# Patient Record
Sex: Male | Born: 1945 | Race: White | Hispanic: No | Smoking: Former smoker
Health system: Southern US, Community
[De-identification: ages and names within clinical notes are randomized; demographics above are authoritative.]

## PROBLEM LIST (undated history)

## (undated) DIAGNOSIS — F1096 Alcohol use, unspecified with alcohol-induced persisting amnestic disorder: Secondary | ICD-10-CM

## (undated) DIAGNOSIS — F4310 Post-traumatic stress disorder, unspecified: Secondary | ICD-10-CM

## (undated) DIAGNOSIS — R42 Dizziness and giddiness: Secondary | ICD-10-CM

## (undated) DIAGNOSIS — E162 Hypoglycemia, unspecified: Secondary | ICD-10-CM

## (undated) DIAGNOSIS — R32 Unspecified urinary incontinence: Secondary | ICD-10-CM

## (undated) DIAGNOSIS — F329 Major depressive disorder, single episode, unspecified: Secondary | ICD-10-CM

## (undated) DIAGNOSIS — M25512 Pain in left shoulder: Secondary | ICD-10-CM

## (undated) DIAGNOSIS — I708 Atherosclerosis of other arteries: Secondary | ICD-10-CM

## (undated) DIAGNOSIS — G9349 Other encephalopathy: Secondary | ICD-10-CM

## (undated) DIAGNOSIS — R262 Difficulty in walking, not elsewhere classified: Secondary | ICD-10-CM

## (undated) DIAGNOSIS — F0390 Unspecified dementia without behavioral disturbance: Secondary | ICD-10-CM

## (undated) DIAGNOSIS — N433 Hydrocele, unspecified: Secondary | ICD-10-CM

## (undated) DIAGNOSIS — E0781 Sick-euthyroid syndrome: Secondary | ICD-10-CM

## (undated) DIAGNOSIS — I7090 Unspecified atherosclerosis: Secondary | ICD-10-CM

## (undated) DIAGNOSIS — H3500 Unspecified background retinopathy: Secondary | ICD-10-CM

## (undated) DIAGNOSIS — F29 Unspecified psychosis not due to a substance or known physiological condition: Secondary | ICD-10-CM

## (undated) DIAGNOSIS — N200 Calculus of kidney: Secondary | ICD-10-CM

## (undated) DIAGNOSIS — E039 Hypothyroidism, unspecified: Secondary | ICD-10-CM

## (undated) DIAGNOSIS — D649 Anemia, unspecified: Secondary | ICD-10-CM

## (undated) DIAGNOSIS — K861 Other chronic pancreatitis: Secondary | ICD-10-CM

## (undated) DIAGNOSIS — I10 Essential (primary) hypertension: Secondary | ICD-10-CM

## (undated) DIAGNOSIS — E119 Type 2 diabetes mellitus without complications: Secondary | ICD-10-CM

## (undated) DIAGNOSIS — K5900 Constipation, unspecified: Secondary | ICD-10-CM

## (undated) DIAGNOSIS — Z9181 History of falling: Secondary | ICD-10-CM

## (undated) DIAGNOSIS — I951 Orthostatic hypotension: Secondary | ICD-10-CM

## (undated) DIAGNOSIS — M5412 Radiculopathy, cervical region: Secondary | ICD-10-CM

## (undated) DIAGNOSIS — E539 Vitamin B deficiency, unspecified: Secondary | ICD-10-CM

## (undated) DIAGNOSIS — G4700 Insomnia, unspecified: Secondary | ICD-10-CM

## (undated) DIAGNOSIS — J449 Chronic obstructive pulmonary disease, unspecified: Secondary | ICD-10-CM

## (undated) DIAGNOSIS — F32A Depression, unspecified: Secondary | ICD-10-CM

## (undated) DIAGNOSIS — E559 Vitamin D deficiency, unspecified: Secondary | ICD-10-CM

## (undated) DIAGNOSIS — R55 Syncope and collapse: Secondary | ICD-10-CM

## (undated) DIAGNOSIS — H548 Legal blindness, as defined in USA: Secondary | ICD-10-CM

## (undated) DIAGNOSIS — F514 Sleep terrors [night terrors]: Secondary | ICD-10-CM

## (undated) DIAGNOSIS — F1021 Alcohol dependence, in remission: Secondary | ICD-10-CM

## (undated) DIAGNOSIS — N400 Enlarged prostate without lower urinary tract symptoms: Secondary | ICD-10-CM

## (undated) DIAGNOSIS — F1011 Alcohol abuse, in remission: Secondary | ICD-10-CM

## (undated) DIAGNOSIS — F419 Anxiety disorder, unspecified: Secondary | ICD-10-CM

## (undated) HISTORY — DX: Sick-euthyroid syndrome: E07.81

## (undated) HISTORY — PX: HAND SURGERY: SHX662

## (undated) HISTORY — DX: Essential (primary) hypertension: I10

## (undated) HISTORY — DX: Calculus of kidney: N20.0

## (undated) HISTORY — PX: BACK SURGERY: SHX140

## (undated) HISTORY — DX: Vitamin B deficiency, unspecified: E53.9

## (undated) HISTORY — PX: MOHS SURGERY: SHX181

## (undated) HISTORY — DX: Vitamin D deficiency, unspecified: E55.9

## (undated) HISTORY — DX: Unspecified background retinopathy: H35.00

## (undated) HISTORY — DX: Type 2 diabetes mellitus without complications: E11.9

## (undated) HISTORY — DX: Post-traumatic stress disorder, unspecified: F43.10

## (undated) HISTORY — DX: Benign prostatic hyperplasia without lower urinary tract symptoms: N40.0

## (undated) HISTORY — PX: HIP SURGERY: SHX245

## (undated) HISTORY — PX: HERNIA REPAIR: SHX51

## (undated) HISTORY — PX: HEMORRHOID SURGERY: SHX153

## (undated) HISTORY — DX: Other chronic pancreatitis: K86.1

---

## 2004-06-20 ENCOUNTER — Ambulatory Visit: Payer: Self-pay | Admitting: Cardiology

## 2004-07-15 HISTORY — PX: CELIAC ARTERY ANEURYSM REPAIR: SUR1156

## 2006-06-24 ENCOUNTER — Ambulatory Visit: Payer: Self-pay | Admitting: Gastroenterology

## 2006-06-27 ENCOUNTER — Ambulatory Visit: Payer: Self-pay | Admitting: Gastroenterology

## 2006-06-27 ENCOUNTER — Ambulatory Visit (HOSPITAL_COMMUNITY): Admission: RE | Admit: 2006-06-27 | Discharge: 2006-06-27 | Payer: Self-pay | Admitting: Gastroenterology

## 2010-12-15 ENCOUNTER — Emergency Department (HOSPITAL_COMMUNITY)
Admission: EM | Admit: 2010-12-15 | Discharge: 2010-12-15 | Disposition: A | Discharge status: Home / Self Care | Payer: Medicare Other | Attending: Emergency Medicine | Admitting: Emergency Medicine

## 2010-12-15 DIAGNOSIS — Z794 Long term (current) use of insulin: Secondary | ICD-10-CM | POA: Insufficient documentation

## 2010-12-15 DIAGNOSIS — E119 Type 2 diabetes mellitus without complications: Secondary | ICD-10-CM | POA: Insufficient documentation

## 2010-12-15 DIAGNOSIS — F0390 Unspecified dementia without behavioral disturbance: Secondary | ICD-10-CM | POA: Insufficient documentation

## 2010-12-15 LAB — COMPREHENSIVE METABOLIC PANEL
ALT: 13 U/L (ref 0–53)
AST: 15 U/L (ref 0–37)
Albumin: 3.5 g/dL (ref 3.5–5.2)
Alkaline Phosphatase: 88 U/L (ref 39–117)
BUN: 21 mg/dL (ref 6–23)
CO2: 28 mEq/L (ref 19–32)
Calcium: 9.4 mg/dL (ref 8.4–10.5)
Chloride: 90 mEq/L — ABNORMAL LOW (ref 96–112)
Creatinine, Ser: 0.67 mg/dL (ref 0.4–1.5)
GFR calc Af Amer: >60 mL/min (ref >60)
GFR calc non Af Amer: >60 mL/min (ref >60)
Glucose, Bld: 298 mg/dL — ABNORMAL HIGH (ref 70–99)
Potassium: 4.2 mEq/L (ref 3.5–5.1)
Sodium: 130 mEq/L — ABNORMAL LOW (ref 135–145)
Total Bilirubin: 0.2 mg/dL — ABNORMAL LOW (ref 0.3–1.2)
Total Protein: 7.5 g/dL (ref 6.0–8.3)

## 2010-12-15 LAB — RAPID URINE DRUG SCREEN, HOSP PERFORMED
Amphetamines: NOT DETECTED
Barbiturates: NOT DETECTED
Benzodiazepines: POSITIVE — AB
Cocaine: NOT DETECTED
Opiates: NOT DETECTED
Tetrahydrocannabinol: NOT DETECTED

## 2010-12-15 LAB — DIFFERENTIAL
Basophils Absolute: 0.1 10*3/uL (ref 0.0–0.1)
Basophils Relative: 1 % (ref 0–1)
Eosinophils Absolute: 0.4 10*3/uL (ref 0.0–0.7)
Eosinophils Relative: 6 % — ABNORMAL HIGH (ref 0–5)
Lymphocytes Relative: 35 % (ref 12–46)
Lymphs Abs: 2.4 10*3/uL (ref 0.7–4.0)
Monocytes Absolute: 0.6 10*3/uL (ref 0.1–1.0)
Monocytes Relative: 8 % (ref 3–12)
Neutro Abs: 3.4 10*3/uL (ref 1.7–7.7)
Neutrophils Relative %: 49 % (ref 43–77)

## 2010-12-15 LAB — GLUCOSE, CAPILLARY: Glucose-Capillary: 280 mg/dL — ABNORMAL HIGH (ref 70–99)

## 2010-12-15 LAB — CBC
HCT: 38.8 % — ABNORMAL LOW (ref 39.0–52.0)
Hemoglobin: 12.7 g/dL — ABNORMAL LOW (ref 13.0–17.0)
MCH: 28.4 pg (ref 26.0–34.0)
MCHC: 32.7 g/dL (ref 30.0–36.0)
MCV: 86.8 fL (ref 78.0–100.0)
Platelets: 251 10*3/uL (ref 150–400)
RBC: 4.47 MIL/uL (ref 4.22–5.81)
RDW: 14.6 % (ref 11.5–15.5)
WBC: 6.9 10*3/uL (ref 4.0–10.5)

## 2010-12-15 LAB — URINALYSIS, ROUTINE W REFLEX MICROSCOPIC
Bilirubin Urine: NEGATIVE
Glucose, UA: >1000 mg/dL — AB
Hgb urine dipstick: NEGATIVE
Ketones, ur: NEGATIVE mg/dL
Leukocytes, UA: NEGATIVE
Nitrite: NEGATIVE
Protein, ur: NEGATIVE mg/dL
Specific Gravity, Urine: 1.026 (ref 1.005–1.030)
Urobilinogen, UA: 0.2 mg/dL (ref 0.0–1.0)
pH: 5 (ref 5.0–8.0)

## 2010-12-15 LAB — URINE MICROSCOPIC-ADD ON

## 2011-04-19 ENCOUNTER — Encounter: Payer: Self-pay | Admitting: Gastroenterology

## 2011-09-24 ENCOUNTER — Emergency Department (HOSPITAL_COMMUNITY)
Admission: EM | Admit: 2011-09-24 | Discharge: 2011-09-25 | Disposition: A | Discharge status: Other Acute Inpatient Hospital | Payer: Medicare Other | Attending: Emergency Medicine | Admitting: Emergency Medicine

## 2011-09-24 ENCOUNTER — Encounter (HOSPITAL_COMMUNITY): Payer: Self-pay

## 2011-09-24 ENCOUNTER — Emergency Department (HOSPITAL_COMMUNITY): Payer: Medicare Other

## 2011-09-24 ENCOUNTER — Other Ambulatory Visit: Payer: Self-pay

## 2011-09-24 DIAGNOSIS — F341 Dysthymic disorder: Secondary | ICD-10-CM | POA: Insufficient documentation

## 2011-09-24 DIAGNOSIS — E119 Type 2 diabetes mellitus without complications: Secondary | ICD-10-CM | POA: Insufficient documentation

## 2011-09-24 DIAGNOSIS — H409 Unspecified glaucoma: Secondary | ICD-10-CM | POA: Insufficient documentation

## 2011-09-24 DIAGNOSIS — F068 Other specified mental disorders due to known physiological condition: Secondary | ICD-10-CM | POA: Insufficient documentation

## 2011-09-24 DIAGNOSIS — R45851 Suicidal ideations: Secondary | ICD-10-CM

## 2011-09-24 DIAGNOSIS — R110 Nausea: Secondary | ICD-10-CM | POA: Insufficient documentation

## 2011-09-24 DIAGNOSIS — R197 Diarrhea, unspecified: Secondary | ICD-10-CM | POA: Insufficient documentation

## 2011-09-24 HISTORY — DX: Anemia, unspecified: D64.9

## 2011-09-24 HISTORY — DX: Unspecified dementia, unspecified severity, without behavioral disturbance, psychotic disturbance, mood disturbance, and anxiety: F03.90

## 2011-09-24 HISTORY — DX: Depression, unspecified: F32.A

## 2011-09-24 HISTORY — DX: Major depressive disorder, single episode, unspecified: F32.9

## 2011-09-24 HISTORY — DX: Chronic obstructive pulmonary disease, unspecified: J44.9

## 2011-09-24 HISTORY — DX: Anxiety disorder, unspecified: F41.9

## 2011-09-24 HISTORY — DX: Hypoglycemia, unspecified: E16.2

## 2011-09-24 LAB — COMPREHENSIVE METABOLIC PANEL
ALT: 9 U/L (ref 0–53)
AST: 14 U/L (ref 0–37)
Albumin: 3.5 g/dL (ref 3.5–5.2)
Alkaline Phosphatase: 95 U/L (ref 39–117)
BUN: 15 mg/dL (ref 6–23)
CO2: 29 mEq/L (ref 19–32)
Calcium: 9.7 mg/dL (ref 8.4–10.5)
Chloride: 97 mEq/L (ref 96–112)
Creatinine, Ser: 1.11 mg/dL (ref 0.50–1.35)
GFR calc Af Amer: 79 mL/min — ABNORMAL LOW (ref >90)
GFR calc non Af Amer: 68 mL/min — ABNORMAL LOW (ref >90)
Glucose, Bld: 181 mg/dL — ABNORMAL HIGH (ref 70–99)
Potassium: 4.6 mEq/L (ref 3.5–5.1)
Sodium: 135 mEq/L (ref 135–145)
Total Bilirubin: 0.2 mg/dL — ABNORMAL LOW (ref 0.3–1.2)
Total Protein: 7.5 g/dL (ref 6.0–8.3)

## 2011-09-24 LAB — DIFFERENTIAL
Basophils Absolute: 0.1 10*3/uL (ref 0.0–0.1)
Basophils Relative: 1 % (ref 0–1)
Eosinophils Absolute: 0.5 10*3/uL (ref 0.0–0.7)
Eosinophils Relative: 7 % — ABNORMAL HIGH (ref 0–5)
Lymphocytes Relative: 38 % (ref 12–46)
Lymphs Abs: 2.8 10*3/uL (ref 0.7–4.0)
Monocytes Absolute: 0.8 10*3/uL (ref 0.1–1.0)
Monocytes Relative: 11 % (ref 3–12)
Neutro Abs: 3.3 10*3/uL (ref 1.7–7.7)
Neutrophils Relative %: 44 % (ref 43–77)

## 2011-09-24 LAB — ETHANOL: Alcohol, Ethyl (B): <11 mg/dL (ref 0–11)

## 2011-09-24 LAB — RAPID URINE DRUG SCREEN, HOSP PERFORMED
Amphetamines: NOT DETECTED
Barbiturates: NOT DETECTED
Benzodiazepines: POSITIVE — AB
Cocaine: NOT DETECTED
Opiates: NOT DETECTED
Tetrahydrocannabinol: NOT DETECTED

## 2011-09-24 LAB — VALPROIC ACID LEVEL: Valproic Acid Lvl: 17.7 ug/mL — ABNORMAL LOW (ref 50.0–100.0)

## 2011-09-24 LAB — URINALYSIS, ROUTINE W REFLEX MICROSCOPIC
Bilirubin Urine: NEGATIVE
Glucose, UA: NEGATIVE mg/dL
Hgb urine dipstick: NEGATIVE
Ketones, ur: NEGATIVE mg/dL
Leukocytes, UA: NEGATIVE
Nitrite: NEGATIVE
Protein, ur: NEGATIVE mg/dL
Specific Gravity, Urine: 1.02 (ref 1.005–1.030)
Urobilinogen, UA: 0.2 mg/dL (ref 0.0–1.0)
pH: 6 (ref 5.0–8.0)

## 2011-09-24 LAB — CBC
HCT: 40.2 % (ref 39.0–52.0)
Hemoglobin: 13.2 g/dL (ref 13.0–17.0)
MCH: 29 pg (ref 26.0–34.0)
MCHC: 32.8 g/dL (ref 30.0–36.0)
MCV: 88.4 fL (ref 78.0–100.0)
Platelets: 244 10*3/uL (ref 150–400)
RBC: 4.55 MIL/uL (ref 4.22–5.81)
RDW: 14.7 % (ref 11.5–15.5)
WBC: 7.4 10*3/uL (ref 4.0–10.5)

## 2011-09-24 MED ORDER — LORAZEPAM 1 MG PO TABS: 1.0000 mg | ORAL_TABLET | Freq: Three times a day (TID) | ORAL | Status: DC | PRN

## 2011-09-24 MED ORDER — ACETAMINOPHEN 325 MG PO TABS: 650.0000 mg | ORAL_TABLET | ORAL | Status: DC | PRN

## 2011-09-24 MED ORDER — ONDANSETRON HCL 4 MG PO TABS: 4.0000 mg | ORAL_TABLET | Freq: Three times a day (TID) | ORAL | Status: DC | PRN

## 2011-09-24 MED ORDER — IBUPROFEN 400 MG PO TABS: 600.0000 mg | ORAL_TABLET | Freq: Three times a day (TID) | ORAL | Status: DC | PRN

## 2011-09-24 NOTE — ED Notes (Signed)
Per Orvilla Fus from ACT team patient accepted at [facility] if can be medically cleared from here. EDP aware. Tests results to be faxed  To [facility] when complete.

## 2011-09-24 NOTE — ED Notes (Signed)
Pt here from [facility] with an order that reads send to [facility] for CT due to suicidal threats and behaviors. Nurse called nursing home to see where pt was suppose to be. Talked to several people at nursing home but no one was sure what to do with pt. One nurse states pt was clear to go to [facility] and had been accepted there. Nursing home is under the impression that pt can come here det lab work and a CT and go directly there from here. In talking with pt ( he has dementia ) states he has had suicidal thoughts for a year but has no plan

## 2011-09-24 NOTE — ED Notes (Signed)
Test/lab results faxed to [facility] RN.

## 2011-09-24 NOTE — ED Provider Notes (Signed)
History  Scribed for Joya Gaskins, MD, the patient was seen in room APA03/APA03. This chart was scribed by Candelaria Stagers. The patient's care started at 3:14 PM    CSN: [id number]  Arrival date & time 09/24/11  1454   First MD Initiated Contact with Patient 09/24/11 1504     Chief Complaint - suicidal   The history is provided by the patient. The history is limited by the condition of the patient.   Joseph Bowen is a 66 y.o. male who presents to the Emergency Department from [facility] with an order that reads send to [facility] for CT due to suicidal threats and behavior.  Suicidal thoughts started about one year ago.  He states that he has no plans and has no thought of hurting anyone else.  He is experiencing nausea and diarrhea.  He denies rash, headache, and abdmoinal pain.  No other details are known   Past Medical History  Diagnosis Date  . Anxiety   . Depression   . Diabetes mellitus   . Glaucoma   . Dementia   . Altered mental status     History reviewed. No pertinent past surgical history.  No family history on file.  History  Substance Use Topics  . Smoking status: Not on file  . Smokeless tobacco: Not on file  . Alcohol Use:       Review of Systems  Unable to perform ROS: Dementia  Gastrointestinal: Positive for nausea and diarrhea. Negative for abdominal pain.  Neurological: Negative for headaches.  Psychiatric/Behavioral: Positive for suicidal ideas.   Allergies  Review of patient's allergies indicates no known allergies.  Home Medications  No current outpatient prescriptions on file.  BP 134/71  Pulse 82  Temp(Src) 98 F (36.7 C) (Oral)  Resp 18  SpO2 99%  Physical Exam CONSTITUTIONAL: Well developed/well nourished HEAD AND FACE: Normocephalic/atraumatic EYES: EOMI/PERRL ENMT: Mucous membranes moist NECK: supple no meningeal signs CV: S1/S2 noted, no murmurs/rubs/gallops noted LUNGS: Lungs are clear to auscultation bilaterally,  no apparent distress ABDOMEN: soft, nontender, no rebound or guarding GU:no cva tenderness NEURO: Pt is awake/alert, moves all extremitiesx4, gait normal EXTREMITIES: pulses normal, full ROM SKIN: warm, color normal PSYCH: flat affect  ED Course  Procedures  DIAGNOSTIC STUDIES: Oxygen Saturation is 99% on room air, normal by my interpretation.    COORDINATION OF CARE:  3:21PM Ordered: CBC ; Differential ; Comprehensive metabolic panel ; Urinalysis, Routine w reflex microscopic ; Ethanol ; Urine rapid drug screen (hosp performed) ; Valproic acid level ; CT Head Wo Contrast ; DG Chest 2 View ; ED EKG ; Suicide precautions ; Re-check Vital Signs   4:08 PM Pt awaiting medical evaluation then likely transfer to [facility]  Accepted to [place] No acute issues while in the ED Medical stable at this time  MDM  Nursing notes reviewed and considered in documentation All labs/vitals reviewed and considered    Date: 09/24/2011  Rate: 82  Rhythm: normal sinus rhythm  QRS Axis: normal  Intervals: normal  ST/T Wave abnormalities: normal  Conduction Disutrbances:none  Narrative Interpretation:   Old EKG Reviewed: none available at time of interpretation    I personally performed the services described in this documentation, which was scribed in my presence. The recorded information has been reviewed and considered.           Joya Gaskins, MD 09/24/11 Harrietta Guardian

## 2011-09-25 NOTE — ED Notes (Signed)
[facility] here to get pt.  Called report to [place].

## 2012-09-02 ENCOUNTER — Ambulatory Visit (HOSPITAL_COMMUNITY)
Admission: RE | Admit: 2012-09-02 | Discharge: 2012-09-02 | Disposition: A | Discharge status: Home / Self Care | Payer: PRIVATE HEALTH INSURANCE | Source: Ambulatory Visit | Attending: Orthopedic Surgery | Admitting: Orthopedic Surgery

## 2012-09-02 ENCOUNTER — Encounter: Payer: Self-pay | Admitting: Orthopedic Surgery

## 2012-09-02 ENCOUNTER — Other Ambulatory Visit: Payer: Self-pay | Admitting: Orthopedic Surgery

## 2012-09-02 ENCOUNTER — Ambulatory Visit (INDEPENDENT_AMBULATORY_CARE_PROVIDER_SITE_OTHER): Payer: Medicare Other | Admitting: Orthopedic Surgery

## 2012-09-02 VITALS — BP 102/62 | Ht 68.0 in | Wt 148.8 lb

## 2012-09-02 DIAGNOSIS — M79643 Pain in unspecified hand: Secondary | ICD-10-CM

## 2012-09-02 DIAGNOSIS — M720 Palmar fascial fibromatosis [Dupuytren]: Secondary | ICD-10-CM

## 2012-09-02 DIAGNOSIS — M20099 Other deformity of finger(s), unspecified finger(s): Secondary | ICD-10-CM | POA: Insufficient documentation

## 2012-09-02 DIAGNOSIS — M79609 Pain in unspecified limb: Secondary | ICD-10-CM | POA: Insufficient documentation

## 2012-09-02 NOTE — Addendum Note (Signed)
Addended by: Vickki Hearing on: 09/02/2012 12:34 PM   Modules accepted: Level of Service

## 2012-09-02 NOTE — Progress Notes (Signed)
Patient ID: Joseph Bowen, male   DOB: October 30, 1945, 67 y.o.   MRN: [id number] Chief Complaint  Patient presents with  . Hand Pain    Right ring finger pain, no injury   Consultation has been requested by Dr.Quereshi  This patient resides at [facility] he does not complain of any pain he was sent here for evaluation of cross over deformity of his ring and small finger on his right hand as well as palmar contracture consistent with Dupuytren's. An x-ray was obtained that I reviewed and it shows he had an old worth metacarpal fracture it was spiral but it healed normally.  Related review of systems he denies any numbness or tingling in the hand  Expanded physical exam BP 102/62  Ht 5\' 8"  (1.727 m)  Wt 148 lb 12.8 oz (67.495 kg)  BMI 22.63 kg/m2 General appearance normal patient is oriented to person mood is pleasant. He does have cross over deformity of the ring and small fingers but when he flexes and makes his fist everything straightened back out he has a palmar cord in the ring finger has no tenderness. He appears to have hyperextension at the PIP joint skin is otherwise intact color and temperature of the digit normal  X-ray reviewed as stated report reviewed as stated no additional findings on the report again old fourth metacarpal fracture  Plan conservative care.  Dupuytren's disease of palm

## 2012-09-21 ENCOUNTER — Emergency Department (HOSPITAL_COMMUNITY): Payer: PRIVATE HEALTH INSURANCE

## 2012-09-21 ENCOUNTER — Emergency Department (HOSPITAL_COMMUNITY)
Admission: EM | Admit: 2012-09-21 | Discharge: 2012-09-21 | Disposition: A | Discharge status: Other Acute Inpatient Hospital | Payer: PRIVATE HEALTH INSURANCE | Attending: Emergency Medicine | Admitting: Emergency Medicine

## 2012-09-21 ENCOUNTER — Encounter (HOSPITAL_COMMUNITY): Payer: Self-pay

## 2012-09-21 DIAGNOSIS — W19XXXA Unspecified fall, initial encounter: Secondary | ICD-10-CM

## 2012-09-21 DIAGNOSIS — J449 Chronic obstructive pulmonary disease, unspecified: Secondary | ICD-10-CM | POA: Insufficient documentation

## 2012-09-21 DIAGNOSIS — Z794 Long term (current) use of insulin: Secondary | ICD-10-CM | POA: Insufficient documentation

## 2012-09-21 DIAGNOSIS — Z7982 Long term (current) use of aspirin: Secondary | ICD-10-CM | POA: Insufficient documentation

## 2012-09-21 DIAGNOSIS — E1169 Type 2 diabetes mellitus with other specified complication: Secondary | ICD-10-CM | POA: Insufficient documentation

## 2012-09-21 DIAGNOSIS — W06XXXA Fall from bed, initial encounter: Secondary | ICD-10-CM | POA: Insufficient documentation

## 2012-09-21 DIAGNOSIS — Z8669 Personal history of other diseases of the nervous system and sense organs: Secondary | ICD-10-CM | POA: Insufficient documentation

## 2012-09-21 DIAGNOSIS — Y9210 Unspecified residential institution as the place of occurrence of the external cause: Secondary | ICD-10-CM | POA: Insufficient documentation

## 2012-09-21 DIAGNOSIS — S63259A Unspecified dislocation of unspecified finger, initial encounter: Secondary | ICD-10-CM | POA: Insufficient documentation

## 2012-09-21 DIAGNOSIS — J4489 Other specified chronic obstructive pulmonary disease: Secondary | ICD-10-CM | POA: Insufficient documentation

## 2012-09-21 DIAGNOSIS — F0390 Unspecified dementia without behavioral disturbance: Secondary | ICD-10-CM | POA: Insufficient documentation

## 2012-09-21 DIAGNOSIS — F3289 Other specified depressive episodes: Secondary | ICD-10-CM | POA: Insufficient documentation

## 2012-09-21 DIAGNOSIS — D649 Anemia, unspecified: Secondary | ICD-10-CM | POA: Insufficient documentation

## 2012-09-21 DIAGNOSIS — T07XXXA Unspecified multiple injuries, initial encounter: Secondary | ICD-10-CM | POA: Insufficient documentation

## 2012-09-21 DIAGNOSIS — F329 Major depressive disorder, single episode, unspecified: Secondary | ICD-10-CM | POA: Insufficient documentation

## 2012-09-21 DIAGNOSIS — Y939 Activity, unspecified: Secondary | ICD-10-CM | POA: Insufficient documentation

## 2012-09-21 DIAGNOSIS — IMO0002 Reserved for concepts with insufficient information to code with codable children: Secondary | ICD-10-CM | POA: Insufficient documentation

## 2012-09-21 DIAGNOSIS — S329XXA Fracture of unspecified parts of lumbosacral spine and pelvis, initial encounter for closed fracture: Secondary | ICD-10-CM | POA: Insufficient documentation

## 2012-09-21 DIAGNOSIS — Z79899 Other long term (current) drug therapy: Secondary | ICD-10-CM | POA: Insufficient documentation

## 2012-09-21 DIAGNOSIS — F411 Generalized anxiety disorder: Secondary | ICD-10-CM | POA: Insufficient documentation

## 2012-09-21 DIAGNOSIS — S6980XA Other specified injuries of unspecified wrist, hand and finger(s), initial encounter: Secondary | ICD-10-CM | POA: Insufficient documentation

## 2012-09-21 DIAGNOSIS — S6990XA Unspecified injury of unspecified wrist, hand and finger(s), initial encounter: Secondary | ICD-10-CM | POA: Insufficient documentation

## 2012-09-21 LAB — URINALYSIS, ROUTINE W REFLEX MICROSCOPIC
Bilirubin Urine: NEGATIVE
Glucose, UA: NEGATIVE mg/dL
Hgb urine dipstick: NEGATIVE
Leukocytes, UA: NEGATIVE
Nitrite: NEGATIVE
Protein, ur: NEGATIVE mg/dL
Specific Gravity, Urine: >1.03 — ABNORMAL HIGH (ref 1.005–1.030)
Urobilinogen, UA: 0.2 mg/dL (ref 0.0–1.0)
pH: 5.5 (ref 5.0–8.0)

## 2012-09-21 LAB — BASIC METABOLIC PANEL
BUN: 15 mg/dL (ref 6–23)
CO2: 24 mEq/L (ref 19–32)
Calcium: 9.5 mg/dL (ref 8.4–10.5)
Chloride: 96 mEq/L (ref 96–112)
Creatinine, Ser: 0.76 mg/dL (ref 0.50–1.35)
GFR calc Af Amer: >90 mL/min (ref >90)
GFR calc non Af Amer: >90 mL/min (ref >90)
Glucose, Bld: 155 mg/dL — ABNORMAL HIGH (ref 70–99)
Potassium: 4.2 mEq/L (ref 3.5–5.1)
Sodium: 134 mEq/L — ABNORMAL LOW (ref 135–145)

## 2012-09-21 LAB — CBC
HCT: 38.6 % — ABNORMAL LOW (ref 39.0–52.0)
Hemoglobin: 12.7 g/dL — ABNORMAL LOW (ref 13.0–17.0)
MCH: 27.9 pg (ref 26.0–34.0)
MCHC: 32.9 g/dL (ref 30.0–36.0)
MCV: 84.8 fL (ref 78.0–100.0)
Platelets: 288 10*3/uL (ref 150–400)
RBC: 4.55 MIL/uL (ref 4.22–5.81)
RDW: 15.4 % (ref 11.5–15.5)
WBC: 10.4 10*3/uL (ref 4.0–10.5)

## 2012-09-21 LAB — TROPONIN I: Troponin I: <0.3 ng/mL (ref <0.30)

## 2012-09-21 MED ORDER — CEPHALEXIN 500 MG PO CAPS: 500.0000 mg | ORAL_CAPSULE | Freq: Four times a day (QID) | ORAL | Status: DC

## 2012-09-21 MED ORDER — HYDROCODONE-ACETAMINOPHEN 5-325 MG PO TABS: 1.0000 | ORAL_TABLET | Freq: Four times a day (QID) | ORAL | Status: DC | PRN

## 2012-09-21 MED ORDER — HYDROMORPHONE HCL PF 1 MG/ML IJ SOLN
1.0000 mg | Freq: Once | INTRAMUSCULAR | Status: AC
  Administered 2012-09-21: 1 mg via INTRAVENOUS
  Filled 2012-09-21: qty 1

## 2012-09-21 MED ORDER — ONDANSETRON HCL 4 MG/2ML IJ SOLN
4.0000 mg | Freq: Once | INTRAMUSCULAR | Status: AC
  Administered 2012-09-21: 4 mg via INTRAVENOUS
  Filled 2012-09-21: qty 2

## 2012-09-21 MED ORDER — FENTANYL CITRATE 0.05 MG/ML IJ SOLN
50.0000 ug | Freq: Once | INTRAMUSCULAR | Status: AC
  Administered 2012-09-21: 50 ug via INTRAVENOUS
  Filled 2012-09-21: qty 2

## 2012-09-21 MED ORDER — CEFAZOLIN SODIUM 1-5 GM-% IV SOLN
1.0000 g | Freq: Once | INTRAVENOUS | Status: AC
  Administered 2012-09-21: 1 g via INTRAVENOUS
  Filled 2012-09-21: qty 50

## 2012-09-21 MED ORDER — OXYCODONE-ACETAMINOPHEN 5-325 MG PO TABS
1.0000 | ORAL_TABLET | Freq: Once | ORAL | Status: AC
  Administered 2012-09-21: 1 via ORAL
  Filled 2012-09-21: qty 1

## 2012-09-21 MED ORDER — BACITRACIN ZINC 500 UNIT/GM EX OINT
TOPICAL_OINTMENT | CUTANEOUS | Status: AC
  Administered 2012-09-21: 1
  Filled 2012-09-21: qty 0.9

## 2012-09-21 NOTE — ED Notes (Signed)
Report called to Thad Ranger, LPN at [facility].  Reviewed d/c instructions and mobility limitations.  [facility] called to transport back to NH as pt. Is stretcher bound. Patient stable at present, medicated for pain.  O2 sats 90%.

## 2012-09-21 NOTE — ED Provider Notes (Signed)
History     CSN: [id number]  Arrival date & time 09/21/12  7829   First MD Initiated Contact with Patient 09/21/12 [phone]      Chief Complaint  Patient presents with  . Fall  . Hip Injury  . Finger Injury    (Consider location/radiation/quality/duration/timing/severity/associated sxs/prior treatment) HPI History provided by nursing home, EMS and limited history from patient. Level V caveat applies for dementia. Patient found down at nursing home with suspected ground level fall. He has deformity to his finger but also complaining of right hip pain. He denies any neck pain, chest pain or difficulty breathing.  Past Medical History  Diagnosis Date  . Anxiety   . Depression   . Diabetes mellitus   . Glaucoma(365)   . Dementia   . Altered mental status   . COPD (chronic obstructive pulmonary disease)   . Anemia   . Hypoglycemia     Past Surgical History  Procedure Laterality Date  . Hand surgery    . Back surgery      melanoma removed    Family History  Problem Relation Age of Onset  . Heart disease    . Diabetes      History  Substance Use Topics  . Smoking status: Never Smoker   . Smokeless tobacco: Not on file  . Alcohol Use: No      Review of Systems  Unable to perform ROS  level V caveat applies  Allergies  Review of patient's allergies indicates no known allergies.  Home Medications   Current Outpatient Rx  Name  Route  Sig  Dispense  Refill  . aspirin EC 81 MG tablet   Oral   Take 81 mg by mouth daily.         . brimonidine (ALPHAGAN) 0.2 % ophthalmic solution   Both Eyes   Place 1 drop into both eyes 2 (two) times daily.         Marland Kitchen DIAZEPAM IM   Intramuscular   Inject 1 mg into the muscle every 8 (eight) hours as needed. For agitation         . ferrous gluconate (FERGON) 325 MG tablet   Oral   Take 325 mg by mouth 2 (two) times daily with a meal.         . gabapentin (NEURONTIN) 300 MG capsule   Oral   Take 300 mg by mouth 3  (three) times daily.         Marland Kitchen HYDROcodone-acetaminophen (NORCO/VICODIN) 5-325 MG per tablet   Oral   Take 1 tablet by mouth every 6 (six) hours as needed for pain.         Marland Kitchen insulin glargine (LANTUS) 100 UNIT/ML injection   Subcutaneous   Inject 3-20 Units into the skin daily. Inject 3 units at bedtime and 20 units every morning         . LORazepam (ATIVAN) 0.5 MG tablet   Oral   Take 0.5 mg by mouth every 8 (eight) hours.         . Multiple Vitamin (MULITIVITAMIN WITH MINERALS) TABS   Oral   Take 1 tablet by mouth daily.         . pantoprazole (PROTONIX) 40 MG tablet   Oral   Take 40 mg by mouth daily.         . polyethylene glycol (MIRALAX / GLYCOLAX) packet   Oral   Take 17 g by mouth daily.         Marland Kitchen  Tamsulosin HCl (FLOMAX) 0.4 MG CAPS   Oral   Take 0.4 mg by mouth daily.         Marland Kitchen thiamine 100 MG tablet   Oral   Take 100 mg by mouth daily.         Marland Kitchen venlafaxine XR (EFFEXOR-XR) 150 MG 24 hr capsule   Oral   Take 150 mg by mouth daily.         . vitamin B-12 (CYANOCOBALAMIN) 1000 MCG tablet   Oral   Take 1,000 mcg by mouth every morning.         . divalproex (DEPAKOTE) 125 MG DR tablet   Oral   Take 125 mg by mouth 2 (two) times daily.         Marland Kitchen donepezil (ARICEPT) 5 MG tablet   Oral   Take 5 mg by mouth at bedtime.         . insulin aspart (NOVOLOG) 100 UNIT/ML injection   Subcutaneous   Inject 3-8 Units into the skin See admin instructions. **Inject SQ four times daily-151-200= 1 unit, 201-250= 3 units, 301-350= 5 units, 351-400= 6 units, 401-450= 7 units, 451-500= 8 units, 501-550= 9 units, 551-600= 10 units. Inject 3 units daily at noon and 8 units twice daily before meals**         . latanoprost (XALATAN) 0.005 % ophthalmic solution   Both Eyes   Place 1 drop into both eyes at bedtime.         . lipase/protease/amylase (CREON-10/PANCREASE) 12000 UNITS CPEP   Oral   Take 3 capsules by mouth 3 (three) times daily with  meals. **Take 30 minutes before meals**         . lisinopril (PRINIVIL,ZESTRIL) 5 MG tablet   Oral   Take 5 mg by mouth every morning.         . loxapine (LOXITANE) 5 MG capsule   Oral   Take 5 mg by mouth every morning.         . metFORMIN (GLUCOPHAGE) 500 MG tablet   Oral   Take 500 mg by mouth 2 (two) times daily.         Marland Kitchen omeprazole (PRILOSEC) 20 MG capsule   Oral   Take 20 mg by mouth daily.         . polysaccharide iron (NIFEREX) 150 MG CAPS capsule   Oral   Take 150 mg by mouth 2 (two) times daily.         . risperiDONE (RISPERDAL) 0.25 MG tablet   Oral   Take 0.25 mg by mouth 2 (two) times daily.         . sitaGLIPtin (JANUVIA) 50 MG tablet   Oral   Take 50 mg by mouth daily.           BP 137/83  Pulse 78  Temp(Src) 97.5 F (36.4 C) (Oral)  Resp 20  Ht 5\' 8"  (1.727 m)  Wt 150 lb (68.04 kg)  BMI 22.81 kg/m2  SpO2 98%  Physical Exam  Constitutional: He appears well-developed and well-nourished.  HENT:  Head: Normocephalic.  Abrasion to right for head without laceration - there is mild swelling but no significant tenderness. No entrapment with extraocular movements intact. No epistaxis. No trismus.  Eyes: EOM are normal. Pupils are equal, round, and reactive to light.  Neck: Neck supple.  No cervical spine tenderness or deformity. C-collar place  Cardiovascular: Normal rate, regular rhythm and intact distal pulses.   Pulmonary/Chest: Effort  normal and breath sounds normal. No respiratory distress. He exhibits no tenderness.  Abdominal: Soft. Bowel sounds are normal. He exhibits no distension. There is no tenderness.  Musculoskeletal:  Right third PIP deformity with palmar aspect skin defect - appears superficial and is small. Distal cap refill intact. No hand tenderness or deformity otherwise. No wrist tenderness. Pelvis is stable with tenderness over the area of the right greater trochanter. No lower extremity deformity or rotation and  distal neurovascular intact x4  Neurological:  Awake, alert and interactive - no focal deficit  Skin: Skin is warm and dry.    ED Course  Reduction of dislocation Date/Time: 09/21/2012 5:49 AM Performed by: [name] Authorized by: [name] Consent: Verbal consent obtained. Risks and benefits: risks, benefits and alternatives were discussed Consent given by: patient Patient understanding: patient states understanding of the procedure being performed Patient consent: the patient's understanding of the procedure matches consent given Procedure consent: procedure consent matches procedure scheduled Required items: required blood products, implants, devices, and special equipment available Patient identity confirmed: verbally with patient Time out: Immediately prior to procedure a "time out" was called to verify the correct patient, procedure, equipment, support staff and site/side marked as required. Preparation: Patient was prepped and draped in the usual sterile fashion. Patient tolerance: Patient tolerated the procedure well with no immediate complications. Comments: R 3rd digit reduced with traction, finger splint applied   (including critical care time)  Labs Reviewed  CBC - Abnormal; Notable for the following:    Hemoglobin 12.7 (*)    HCT 38.6 (*)    All other components within normal limits  BASIC METABOLIC PANEL - Abnormal; Notable for the following:    Sodium 134 (*)    Glucose, Bld 155 (*)    All other components within normal limits  URINALYSIS, ROUTINE W REFLEX MICROSCOPIC   Results for orders placed during the hospital encounter of 09/21/12  CBC      Result Value Range   WBC 10.4  4.0 - 10.5 K/uL   RBC 4.55  4.22 - 5.81 MIL/uL   Hemoglobin 12.7 (*) 13.0 - 17.0 g/dL   HCT 16.1 (*) 09.6 - 04.5 %   MCV 84.8  78.0 - 100.0 fL   MCH 27.9  26.0 - 34.0 pg   MCHC 32.9  30.0 - 36.0 g/dL   RDW 40.9  81.1 - 91.4 %   Platelets 288  150 - 400 K/uL  BASIC METABOLIC  PANEL      Result Value Range   Sodium 134 (*) 135 - 145 mEq/L   Potassium 4.2  3.5 - 5.1 mEq/L   Chloride 96  96 - 112 mEq/L   CO2 24  19 - 32 mEq/L   Glucose, Bld 155 (*) 70 - 99 mg/dL   BUN 15  6 - 23 mg/dL   Creatinine, Ser 7.82  0.50 - 1.35 mg/dL   Calcium 9.5  8.4 - 95.6 mg/dL   GFR calc non Af Amer >90  >90 mL/min   GFR calc Af Amer >90  >90 mL/min  URINALYSIS, ROUTINE W REFLEX MICROSCOPIC      Result Value Range   Color, Urine YELLOW  YELLOW   APPearance CLEAR  CLEAR   Specific Gravity, Urine >1.030 (*) 1.005 - 1.030   pH 5.5  5.0 - 8.0   Glucose, UA NEGATIVE  NEGATIVE mg/dL   Hgb urine dipstick NEGATIVE  NEGATIVE   Bilirubin Urine NEGATIVE  NEGATIVE   Ketones, ur  TRACE (*) NEGATIVE mg/dL   Protein, ur NEGATIVE  NEGATIVE mg/dL   Urobilinogen, UA 0.2  0.0 - 1.0 mg/dL   Nitrite NEGATIVE  NEGATIVE   Leukocytes, UA NEGATIVE  NEGATIVE  TROPONIN I      Result Value Range   Troponin I <0.30  <0.30 ng/mL   Dg Chest 1 View  09/21/2012  *RADIOLOGY REPORT*  Clinical Data: Fall, back pain  CHEST - 1 VIEW  Comparison: 09/24/2011  Findings: 1 cm rounded density projecting over each lower lung. Linear opacity within the left lower lung / lingula.  Biapical scarring.  No pleural effusion or pneumothorax.  Cardiomediastinal contours within normal range.  No displaced fracture identified.  IMPRESSION: 1 cm nodular density projecting over each lung may be external; such as nipple shadows or radiolucent EKG leads.  Consider PA and lateral radiograph follow-up with nipple markers.   Original Report Authenticated By: Jearld Lesch, M.D.    Dg Thoracic Spine 2 View  09/21/2012  *RADIOLOGY REPORT*  Clinical Data: Fall, back pain  THORACIC SPINE - 2 VIEW  Comparison: 09/24/2011 radiograph  Findings: Diffuse osteopenia.  Multilevel degenerative change.  No displaced fracture or malalignment.  Overlying soft tissues unremarkable.  Metallic density/coils left upper quadrant and calcifications along  the course of the pancreas.  IMPRESSION: Limited characterization given diffuse osteopenia.  No displaced fracture. However, correlate for point tenderness and with cross- sectional imaging if concern persists.   Original Report Authenticated By: Jearld Lesch, M.D.    Dg Lumbar Spine Complete  09/21/2012  *RADIOLOGY REPORT*  Clinical Data: Back pain status post fall  LUMBAR SPINE - COMPLETE 4+ VIEW  Comparison: 08/08/2010 chest CT  Findings: Radiographs of the lumbar spine show mild rightward curvature.  Diffuse osteopenia and multilevel degenerative change. There is a compression fracture at L1, which appears similar to prior.  Otherwise, lumbar vertebrae show maintained height and alignment.  Anterior osteophyte formation at multiple levels and L5 S1 degenerative disc disease.  Advanced atherosclerosis. Pancreatic calcifications.  Left upper quadrant coils.  Overlying soft tissues otherwise unremarkable.  IMPRESSION: L1 compression fracture is unchanged.  No definite acute osseous finding.   Original Report Authenticated By: Jearld Lesch, M.D.    Dg Elbow Complete Right  09/21/2012  *RADIOLOGY REPORT*  Clinical Data: Right elbow pain status post fall  RIGHT ELBOW - COMPLETE 3+ VIEW  Comparison: None.  Findings: No displaced fracture identified.  No dislocation. Lateral view is suboptimal.  No definite joint effusion.  Oval calcific density projecting within the soft tissues lateral and volar to the proximal forearm.  IMPRESSION: No displaced fracture identified.  Suboptimal lateral view.  If clinical concern persists, recommend a short-term repeat lateral view at 90 degrees when the patient can tolerate.   Original Report Authenticated By: Jearld Lesch, M.D.    Dg Hip Complete Right  09/21/2012  *RADIOLOGY REPORT*  Clinical Data: Fall, right hip pain  RIGHT HIP - COMPLETE 2+ VIEW  Comparison: None.  Findings: There are fractures of the right inferior and superior pubic rami.  The femoral  acetabular joint appears intact. Overlying soft tissues unremarkable.  IMPRESSION: Fractures of the right superior and inferior pubic rami.  No displaced right femur fracture.  If clinical concern for a nondisplaced fracture persists, MRI has increased sensitivity.   Original Report Authenticated By: Jearld Lesch, M.D.    Ct Head Wo Contrast  09/21/2012  *RADIOLOGY REPORT*  Clinical Data:  Fall, neck pain and headache  CT HEAD WITHOUT CONTRAST CT CERVICAL SPINE WITHOUT CONTRAST  Technique:  Multidetector CT imaging of the head and cervical spine was performed following the standard protocol without intravenous contrast.  Multiplanar CT image reconstructions of the cervical spine were also generated.  Comparison:  09/24/2011  CT HEAD  Findings: Prominence of the sulci, cisterns, and ventricles, in keeping with volume loss. There are subcortical and periventricular white matter hypodensities, a nonspecific finding most often seen with chronic microangiopathic changes.  There is no evidence for acute hemorrhage, overt hydrocephalus, mass lesion, or abnormal extra-axial fluid collection.  No definite CT evidence for acute cortical based (large artery) infarction. Ethmoid air cells are partially opacified. Otherwise, the visualized paranasal sinuses and mastoid air cells are predominately clear.  No displaced calvarial fracture.  Mild right supraorbital/frontal scalp swelling.  IMPRESSION: Volume loss white matter changes are similar to prior.  No CT evidence for acute intracranial abnormality.  Mild right frontal scalp swelling.  No underlying calvarial fracture.  Ethmoid air cell opacification.  Correlate clinically for acute sinusitis.  CT CERVICAL SPINE  Findings: Apical scarring/emphysematous changes.  Atherosclerotic vascular calcifications.  Maintained craniocervical relationship. No dens fracture.  Minimal retrolisthesis of C5 on C6.  Disc osteophyte complex at this level results in mild central canal  narrowing. No acute fracture or disloc No acute fracture ation.  IMPRESSION: Multilevel degenerative changes.  No acute osseous finding of the cervical spine.   Original Report Authenticated By: Jearld Lesch, M.D.    Ct Cervical Spine Wo Contrast  09/21/2012  *RADIOLOGY REPORT*  Clinical Data:  Fall, neck pain and headache  CT HEAD WITHOUT CONTRAST  CT CERVICAL SPINE WITHOUT CONTRAST  Technique:  Multidetector CT imaging of the head and cervical spine was performed following the standard protocol without intravenous contrast.  Multiplanar CT image reconstructions of the cervical spine were also generated.  Comparison:  09/24/2011  CT HEAD  Findings: Prominence of the sulci, cisterns, and ventricles, in keeping with volume loss. There are subcortical and periventricular white matter hypodensities, a nonspecific finding most often seen with chronic microangiopathic changes.  There is no evidence for acute hemorrhage, overt hydrocephalus, mass lesion, or abnormal extra-axial fluid collection.  No definite CT evidence for acute cortical based (large artery) infarction. Ethmoid air cells are partially opacified. Otherwise, the visualized paranasal sinuses and mastoid air cells are predominately clear.  No displaced calvarial fracture.  Mild right supraorbital/frontal scalp swelling.  IMPRESSION:  Volume loss white matter changes are similar to prior.  No CT evidence for acute intracranial abnormality.  Mild right frontal scalp swelling.  No underlying calvarial fracture.  Ethmoid air cell opacification.  Correlate clinically for acute sinusitis.  CT CERVICAL SPINE  Findings: Apical scarring/emphysematous changes.  Atherosclerotic vascular calcifications.  Maintained craniocervical relationship. No dens fracture.  Minimal retrolisthesis of C5 on C6.  Disc osteophyte complex at this level results in mild central canal narrowing. No acute fracture or disloc No acute fracture ation.  IMPRESSION:  Multilevel  degenerative changes.  No acute osseous finding of the cervical spine.    Original Report Authenticated By: Jearld Lesch, M.D.    Dg Hand Complete Right  09/21/2012  *RADIOLOGY REPORT*  Clinical Data: Right hand third digit pain  RIGHT HAND - COMPLETE 3+ VIEW  Comparison: 09/02/2012  Findings: Dislocation at the third PIP joint, with ulnar and dorsal displacement of the distal component.  No acute fracture identified.  No aggressive osseous lesion.  Evidence of prior fourth metacarpal fracture, unchanged.  IMPRESSION: Ulnar and dorsal dislocation of the third PIP joint.   Original Report Authenticated By: Jearld Lesch, M.D.      IV narcotics provided. Finger reduced. IV antibiotics.  Plan outpatient followup for finger dislocation with finger splint provided. For pelvic fracture, plan discharge back to facility and followup outpatient orthopedics. No weightbearing until cleared by orthopedic MDM  Fall -  nursing home patient with dementia  Pelvic FX, Finger dislocation, mild scalp abrasion - repeat exam without elbow tenderness does have a small abrasion  Narcotics. IV antibiotics. - Discharged home with oral medications for the same     [name], MD 09/22/12 [phone]

## 2012-09-21 NOTE — ED Provider Notes (Signed)
Pt left with me at change of shift to check UA and to have patient wake up more after his pain meds.  PT is awake and alert. His C collar was removed and he wanted the [place] to be removed. His pulse ox was 95% on RA.   09:02 sleeping his pulse ox in 91 % on RA, is ready for discharge.   Results for orders placed during the hospital encounter of 09/21/12  URINALYSIS, ROUTINE W REFLEX MICROSCOPIC      Result Value Range   Color, Urine YELLOW  YELLOW   APPearance CLEAR  CLEAR   Specific Gravity, Urine >1.030 (*) 1.005 - 1.030   pH 5.5  5.0 - 8.0   Glucose, UA NEGATIVE  NEGATIVE mg/dL   Hgb urine dipstick NEGATIVE  NEGATIVE   Bilirubin Urine NEGATIVE  NEGATIVE   Ketones, ur TRACE (*) NEGATIVE mg/dL   Protein, ur NEGATIVE  NEGATIVE mg/dL   Urobilinogen, UA 0.2  0.0 - 1.0 mg/dL   Nitrite NEGATIVE  NEGATIVE   Leukocytes, UA NEGATIVE  NEGATIVE     Ward Givens, MD 09/21/12 [phone]

## 2012-09-21 NOTE — ED Notes (Signed)
Pt from [facility], pt apparently fell out of the bed, was found on the floor next to bed. Pt has deformity to right middle finger, pain and injury to right elbow, pain to right hip

## 2012-09-23 ENCOUNTER — Ambulatory Visit (INDEPENDENT_AMBULATORY_CARE_PROVIDER_SITE_OTHER): Payer: PRIVATE HEALTH INSURANCE | Admitting: Orthopedic Surgery

## 2012-09-23 ENCOUNTER — Encounter: Payer: Self-pay | Admitting: Orthopedic Surgery

## 2012-09-23 VITALS — BP 106/60 | Ht 68.0 in | Wt 150.0 lb

## 2012-09-23 DIAGNOSIS — S329XXA Fracture of unspecified parts of lumbosacral spine and pelvis, initial encounter for closed fracture: Secondary | ICD-10-CM

## 2012-09-23 DIAGNOSIS — S63289A Dislocation of proximal interphalangeal joint of unspecified finger, initial encounter: Secondary | ICD-10-CM | POA: Insufficient documentation

## 2012-09-23 DIAGNOSIS — S63279A Dislocation of unspecified interphalangeal joint of unspecified finger, initial encounter: Secondary | ICD-10-CM

## 2012-09-23 NOTE — Patient Instructions (Addendum)
Weight bear as tolerated with walker and therapy

## 2012-09-23 NOTE — Progress Notes (Signed)
Patient ID: Joseph Bowen, male   DOB: 03/20/1946, 67 y.o.   MRN: [id number] Chief Complaint  Patient presents with  . Fracture    pelvis 3-10 and RLF dislcoation    History: The patient lives at a nursing home he got up tripped fell injured his right pelvis and right long finger sustaining a right pelvic fracture and a right long finger PIP joint dislocation which was treated closed in the emergency room and reduced. He is placed in a splint. He was kept nonweightbearing until his visit here today.  He complains of sharp 7/10 intermittent pain in his right groin area which is worse depending on position he also has some pain over his right long finger and he has an abrasion over his right elbow area.  Review of system history blurred vision and watering of the eyes as well as chest pain or shortness of breath, wheezing, heartburn, tingling and tremors anxiety, depression, hallucinations. Heat and cold intolerance seasonal allergies also noted. Joint pain and stiffness chronic unrelated to this injury  Denies weight loss urinary complaints skin changes easy bleeding.  Past Surgical History  Procedure Laterality Date  . Hand surgery    . Back surgery      melanoma removed    Past Medical History  Diagnosis Date  . Anxiety   . Depression   . Diabetes mellitus   . Glaucoma(365)   . Dementia   . Altered mental status   . COPD (chronic obstructive pulmonary disease)   . Anemia   . Hypoglycemia   . Vitamin B deficiency   . Benign prostatic hypertrophy   . Vitamin D deficiency   . Post traumatic stress disorder   . Chronic pancreatitis   . Hypertension   . Retinopathy   . Euthyroid sick syndrome    Past Surgical History  Procedure Laterality Date  . Hand surgery    . Back surgery      melanoma removed   . History  Substance Use Topics  . Smoking status: Never Smoker   . Smokeless tobacco: Not on file  . Alcohol Use: No   Family History  Problem Relation Age of Onset  .  Heart disease    . Diabetes     BP 106/60  Ht 5\' 8"  (1.727 m)  Wt 150 lb (68.04 kg)  BMI 22.81 kg/m2 Overall his appearance as well Well-developed well-nourished oriented to person place and time his mood is pleasant he cannot walk he came to the tourniquet with a wheelchair  Right upper extremity there is an abrasion over the right elbow shoulder elbow wrist area normal his right long finger PIP and is swollen tender flexion is only 20 he has an abrasion on the volar side of the is stable in extension muscle tone is normal skin abrasion as stated  Left upper extremity I see no fracture dislocation subluxation atrophy tremor or skin changes  Left lower extremity exam normal alignment, range of motion, stability, motor exam and skin.  Right lower M.D. is held in extension is painful flexion and rotation. No instability of the hip with a post pull test motor exam normal. No skin changes.  Cardiovascular exam normal sensation intact no lymphadenopathy no pathologic reflexes and balance could not be assessed  Several x-rays were taken at the hospital.  Chest x-ray, normal RIGHT upper extremity form x-ray normal, including elbow, normal RIGHT hand x-ray showing PIP joint dislocation.  Pelvic and hip x-rays show superior pubic ramus fracture possible  inferior as well.  CT of the head and neck normal for acute injury.    Pelvic fracture, closed, initial encounter - Plan: Ambulatory referral to Physical Therapy  Dislocation of PIP joint of finger, initial encounter   Plan close treatment of pelvic fracture weight bear as tolerated with physical therapy  I removed the splint and he is allowed flexion as tolerated  Return 6 weeks for x-ray of his pelvis.

## 2012-09-24 ENCOUNTER — Ambulatory Visit: Payer: Medicare Other | Admitting: Orthopedic Surgery

## 2012-11-05 ENCOUNTER — Ambulatory Visit: Payer: PRIVATE HEALTH INSURANCE | Admitting: Orthopedic Surgery

## 2012-11-10 ENCOUNTER — Ambulatory Visit (INDEPENDENT_AMBULATORY_CARE_PROVIDER_SITE_OTHER): Payer: PRIVATE HEALTH INSURANCE

## 2012-11-10 ENCOUNTER — Ambulatory Visit (INDEPENDENT_AMBULATORY_CARE_PROVIDER_SITE_OTHER): Payer: PRIVATE HEALTH INSURANCE | Admitting: Orthopedic Surgery

## 2012-11-10 ENCOUNTER — Encounter: Payer: Self-pay | Admitting: Orthopedic Surgery

## 2012-11-10 VITALS — BP 108/70 | Ht 68.0 in | Wt 150.0 lb

## 2012-11-10 DIAGNOSIS — S329XXA Fracture of unspecified parts of lumbosacral spine and pelvis, initial encounter for closed fracture: Secondary | ICD-10-CM | POA: Insufficient documentation

## 2012-11-10 DIAGNOSIS — IMO0001 Reserved for inherently not codable concepts without codable children: Secondary | ICD-10-CM

## 2012-11-10 DIAGNOSIS — S329XXD Fracture of unspecified parts of lumbosacral spine and pelvis, subsequent encounter for fracture with routine healing: Secondary | ICD-10-CM

## 2012-11-10 NOTE — Progress Notes (Signed)
Patient ID: Joseph Bowen, male   DOB: 1945-09-21, 67 y.o.   MRN: [id number] BP 108/70  Ht 5\' 8"  (1.727 m)  Wt 150 lb (68.04 kg)  BMI 22.81 kg/m2 Chief Complaint  Patient presents with  . Follow-up    6 week recheck pelvis fracture DOI 09/21/12   Status post pelvic fracture on the right six-week x-ray show stable fracture with callus  Clinical exam shows painless range of motion in the right hip and equal leg lengths  Recommend advance therapy as tolerated until the patient can ambulate as he did prior to his fracture no followup necessary

## 2012-11-10 NOTE — Patient Instructions (Addendum)
The fracture has healed   Increase weight bearing as tolerated advance therapy as tolerated

## 2013-05-03 ENCOUNTER — Other Ambulatory Visit: Payer: Self-pay | Admitting: *Deleted

## 2013-05-03 MED ORDER — LORAZEPAM 0.5 MG PO TABS: ORAL_TABLET | ORAL | Status: DC

## 2013-11-29 ENCOUNTER — Emergency Department (HOSPITAL_COMMUNITY): Payer: PRIVATE HEALTH INSURANCE

## 2013-11-29 ENCOUNTER — Emergency Department (HOSPITAL_COMMUNITY)
Admission: EM | Admit: 2013-11-29 | Discharge: 2013-11-30 | Disposition: A | Discharge status: Home / Self Care | Payer: PRIVATE HEALTH INSURANCE | Attending: Emergency Medicine | Admitting: Emergency Medicine

## 2013-11-29 ENCOUNTER — Encounter (HOSPITAL_COMMUNITY): Payer: Self-pay | Admitting: Emergency Medicine

## 2013-11-29 DIAGNOSIS — F411 Generalized anxiety disorder: Secondary | ICD-10-CM | POA: Insufficient documentation

## 2013-11-29 DIAGNOSIS — F3289 Other specified depressive episodes: Secondary | ICD-10-CM | POA: Insufficient documentation

## 2013-11-29 DIAGNOSIS — IMO0002 Reserved for concepts with insufficient information to code with codable children: Secondary | ICD-10-CM | POA: Insufficient documentation

## 2013-11-29 DIAGNOSIS — Z8669 Personal history of other diseases of the nervous system and sense organs: Secondary | ICD-10-CM | POA: Insufficient documentation

## 2013-11-29 DIAGNOSIS — Z8739 Personal history of other diseases of the musculoskeletal system and connective tissue: Secondary | ICD-10-CM | POA: Insufficient documentation

## 2013-11-29 DIAGNOSIS — Z87448 Personal history of other diseases of urinary system: Secondary | ICD-10-CM | POA: Insufficient documentation

## 2013-11-29 DIAGNOSIS — W07XXXA Fall from chair, initial encounter: Secondary | ICD-10-CM | POA: Insufficient documentation

## 2013-11-29 DIAGNOSIS — S62609A Fracture of unspecified phalanx of unspecified finger, initial encounter for closed fracture: Secondary | ICD-10-CM

## 2013-11-29 DIAGNOSIS — Z792 Long term (current) use of antibiotics: Secondary | ICD-10-CM | POA: Insufficient documentation

## 2013-11-29 DIAGNOSIS — F1021 Alcohol dependence, in remission: Secondary | ICD-10-CM | POA: Insufficient documentation

## 2013-11-29 DIAGNOSIS — E119 Type 2 diabetes mellitus without complications: Secondary | ICD-10-CM | POA: Insufficient documentation

## 2013-11-29 DIAGNOSIS — Y929 Unspecified place or not applicable: Secondary | ICD-10-CM | POA: Insufficient documentation

## 2013-11-29 DIAGNOSIS — J4489 Other specified chronic obstructive pulmonary disease: Secondary | ICD-10-CM | POA: Insufficient documentation

## 2013-11-29 DIAGNOSIS — Z7982 Long term (current) use of aspirin: Secondary | ICD-10-CM | POA: Insufficient documentation

## 2013-11-29 DIAGNOSIS — J449 Chronic obstructive pulmonary disease, unspecified: Secondary | ICD-10-CM | POA: Insufficient documentation

## 2013-11-29 DIAGNOSIS — Z8719 Personal history of other diseases of the digestive system: Secondary | ICD-10-CM | POA: Insufficient documentation

## 2013-11-29 DIAGNOSIS — R51 Headache: Secondary | ICD-10-CM | POA: Insufficient documentation

## 2013-11-29 DIAGNOSIS — D649 Anemia, unspecified: Secondary | ICD-10-CM | POA: Insufficient documentation

## 2013-11-29 DIAGNOSIS — I10 Essential (primary) hypertension: Secondary | ICD-10-CM | POA: Insufficient documentation

## 2013-11-29 DIAGNOSIS — S32509A Unspecified fracture of unspecified pubis, initial encounter for closed fracture: Secondary | ICD-10-CM | POA: Insufficient documentation

## 2013-11-29 DIAGNOSIS — F0390 Unspecified dementia without behavioral disturbance: Secondary | ICD-10-CM | POA: Insufficient documentation

## 2013-11-29 DIAGNOSIS — S329XXA Fracture of unspecified parts of lumbosacral spine and pelvis, initial encounter for closed fracture: Secondary | ICD-10-CM

## 2013-11-29 DIAGNOSIS — F329 Major depressive disorder, single episode, unspecified: Secondary | ICD-10-CM | POA: Insufficient documentation

## 2013-11-29 DIAGNOSIS — Z794 Long term (current) use of insulin: Secondary | ICD-10-CM | POA: Insufficient documentation

## 2013-11-29 DIAGNOSIS — Y9389 Activity, other specified: Secondary | ICD-10-CM | POA: Insufficient documentation

## 2013-11-29 HISTORY — DX: Alcohol dependence, in remission: F10.21

## 2013-11-29 LAB — CBC WITH DIFFERENTIAL/PLATELET
Basophils Absolute: 0 10*3/uL (ref 0.0–0.1)
Basophils Relative: 0 % (ref 0–1)
Eosinophils Absolute: 0.3 10*3/uL (ref 0.0–0.7)
Eosinophils Relative: 3 % (ref 0–5)
HCT: 40.9 % (ref 39.0–52.0)
Hemoglobin: 13.6 g/dL (ref 13.0–17.0)
Lymphocytes Relative: 20 % (ref 12–46)
Lymphs Abs: 2.2 10*3/uL (ref 0.7–4.0)
MCH: 30.4 pg (ref 26.0–34.0)
MCHC: 33.3 g/dL (ref 30.0–36.0)
MCV: 91.5 fL (ref 78.0–100.0)
Monocytes Absolute: 0.8 10*3/uL (ref 0.1–1.0)
Monocytes Relative: 7 % (ref 3–12)
Neutro Abs: 7.6 10*3/uL (ref 1.7–7.7)
Neutrophils Relative %: 70 % (ref 43–77)
Platelets: 163 10*3/uL (ref 150–400)
RBC: 4.47 MIL/uL (ref 4.22–5.81)
RDW: 14.2 % (ref 11.5–15.5)
WBC: 10.9 10*3/uL — ABNORMAL HIGH (ref 4.0–10.5)

## 2013-11-29 LAB — PROTIME-INR
INR: 1.07 (ref 0.00–1.49)
Prothrombin Time: 13.7 seconds (ref 11.6–15.2)

## 2013-11-29 MED ORDER — ONDANSETRON HCL 4 MG/2ML IJ SOLN
4.0000 mg | Freq: Once | INTRAMUSCULAR | Status: AC
  Administered 2013-11-29: 4 mg via INTRAVENOUS
  Filled 2013-11-29: qty 2

## 2013-11-29 MED ORDER — FENTANYL CITRATE 0.05 MG/ML IJ SOLN
50.0000 ug | INTRAMUSCULAR | Status: AC | PRN
Start: 2013-11-29 — End: 2013-11-30
  Administered 2013-11-29 – 2013-11-30 (×2): 50 ug via INTRAVENOUS
  Filled 2013-11-29 (×2): qty 2

## 2013-11-29 NOTE — ED Notes (Signed)
Per EMS, [facility]; staff said patient was standing in chair and fell around 1930 this evening.  [facility] and has left hip and pelvic fracture per xray report.  Patient also c/o left pinky pain.

## 2013-11-29 NOTE — ED Provider Notes (Signed)
CSN: [id number]     Arrival date & time 11/29/13  2316 History   First MD Initiated Contact with Patient 11/29/13 2317 This chart was scribed for [name], MD by Valera Castle, ED Scribe. This patient was seen in room APA18/APA18 and the patient's care was started at 11:35 PM.     Chief Complaint  Patient presents with  . Fall   (Consider location/radiation/quality/duration/timing/severity/associated sxs/prior Treatment) Patient is a 68 y.o. male presenting with fall. The history is provided by the patient. No language interpreter was used.  Fall This is a new problem. Episode onset: 1930 this evening. The problem has been gradually improving. Pertinent negatives include no chest pain and no abdominal pain. Associated symptoms comments: Positive for left hip pain, left 5th digit pain, and lower back pain. Negative for syncope, wounds. Exacerbated by: movement.   HPI Comments: Joseph Bowen is a 68 y.o. male brought in by EMS from [facility] nursing home, who presents to the Emergency Department complaining of a fall onset 1930 this evening after looking for a remote control in a closet, losing his balance on a chair, and falling backwards. He reports constant lower back pain, left 5th digit pain, and left hip pain from the fall. He denies LOC, wounds, and any other associated symptoms. He denies being on Coumadin. He reports h/o DM, HTN.   PCP - Toma Deiters, MD  Past Medical History  Diagnosis Date  . Anxiety   . Depression   . Diabetes mellitus   . Glaucoma   . Dementia   . Altered mental status   . COPD (chronic obstructive pulmonary disease)   . Anemia   . Hypoglycemia   . Vitamin B deficiency   . Benign prostatic hypertrophy   . Vitamin D deficiency   . Post traumatic stress disorder   . Chronic pancreatitis   . Hypertension   . Retinopathy   . Euthyroid sick syndrome   . Euthyroid sick syndrome   . Personal history of alcoholism    Past Surgical History  Procedure  Laterality Date  . Hand surgery    . Back surgery      melanoma removed   Family History  Problem Relation Age of Onset  . Heart disease    . Diabetes     History  Substance Use Topics  . Smoking status: Never Smoker   . Smokeless tobacco: Not on file  . Alcohol Use: No     Comment: hx of alcoholism    Review of Systems  Cardiovascular: Negative for chest pain.  Gastrointestinal: Negative for abdominal pain.  All other systems reviewed and are negative.   Allergies  Review of patient's allergies indicates no known allergies.  Home Medications   Prior to Admission medications   Medication Sig Start Date End Date Taking? Authorizing Provider  aspirin EC 81 MG tablet Take 81 mg by mouth daily.    Historical Provider, MD  brimonidine (ALPHAGAN) 0.2 % ophthalmic solution Place 1 drop into both eyes 2 (two) times daily.    Historical Provider, MD  cephALEXin (KEFLEX) 500 MG capsule Take 1 capsule (500 mg total) by mouth 4 (four) times daily. 09/21/12   [name], MD  DIAZEPAM IM Inject 1 mg into the muscle every 8 (eight) hours as needed. For agitation    Historical Provider, MD  divalproex (DEPAKOTE) 125 MG DR tablet Take 125 mg by mouth 2 (two) times daily.    Historical Provider, MD  donepezil (ARICEPT) 5  MG tablet Take 5 mg by mouth at bedtime.    Historical Provider, MD  ferrous gluconate (FERGON) 325 MG tablet Take 325 mg by mouth 2 (two) times daily with a meal.    Historical Provider, MD  gabapentin (NEURONTIN) 300 MG capsule Take 300 mg by mouth 3 (three) times daily.    Historical Provider, MD  HYDROcodone-acetaminophen (NORCO/VICODIN) 5-325 MG per tablet Take 1 tablet by mouth every 6 (six) hours as needed for pain.    Historical Provider, MD  HYDROcodone-acetaminophen (NORCO/VICODIN) 5-325 MG per tablet Take 1 tablet by mouth every 6 (six) hours as needed for pain. 09/21/12   [name], MD  insulin aspart (NOVOLOG) 100 UNIT/ML injection Inject 3-8 Units into the skin  See admin instructions. **Inject SQ four times daily-151-200= 1 unit, 201-250= 3 units, 301-350= 5 units, 351-400= 6 units, 401-450= 7 units, 451-500= 8 units, 501-550= 9 units, 551-600= 10 units. Inject 3 units daily at noon and 8 units twice daily before meals**    Historical Provider, MD  insulin glargine (LANTUS) 100 UNIT/ML injection Inject 3-20 Units into the skin daily. Inject 3 units at bedtime and 20 units every morning    Historical Provider, MD  latanoprost (XALATAN) 0.005 % ophthalmic solution Place 1 drop into both eyes at bedtime.    Historical Provider, MD  lipase/protease/amylase (CREON-10/PANCREASE) 12000 UNITS CPEP Take 3 capsules by mouth 3 (three) times daily with meals. **Take 30 minutes before meals**    Historical Provider, MD  lisinopril (PRINIVIL,ZESTRIL) 5 MG tablet Take 5 mg by mouth every morning.    Historical Provider, MD  LORazepam (ATIVAN) 0.5 MG tablet Take one tablet by mouth three times daily; Take one tablet by mouth twice daily as needed for breakthrough anxiety 05/03/13   Claudie Revering, NP  loxapine (LOXITANE) 5 MG capsule Take 5 mg by mouth every morning.    Historical Provider, MD  metFORMIN (GLUCOPHAGE) 500 MG tablet Take 500 mg by mouth 2 (two) times daily.    Historical Provider, MD  Multiple Vitamin (MULITIVITAMIN WITH MINERALS) TABS Take 1 tablet by mouth daily.    Historical Provider, MD  omeprazole (PRILOSEC) 20 MG capsule Take 20 mg by mouth daily.    Historical Provider, MD  pantoprazole (PROTONIX) 40 MG tablet Take 40 mg by mouth daily.    Historical Provider, MD  polyethylene glycol (MIRALAX / GLYCOLAX) packet Take 17 g by mouth daily.    Historical Provider, MD  polysaccharide iron (NIFEREX) 150 MG CAPS capsule Take 150 mg by mouth 2 (two) times daily.    Historical Provider, MD  risperiDONE (RISPERDAL) 0.25 MG tablet Take 0.25 mg by mouth 2 (two) times daily.    Historical Provider, MD  sitaGLIPtin (JANUVIA) 50 MG tablet Take 50 mg by mouth daily.     Historical Provider, MD  Tamsulosin HCl (FLOMAX) 0.4 MG CAPS Take 0.4 mg by mouth daily.    Historical Provider, MD  thiamine 100 MG tablet Take 100 mg by mouth daily.    Historical Provider, MD  venlafaxine XR (EFFEXOR-XR) 150 MG 24 hr capsule Take 150 mg by mouth daily.    Historical Provider, MD  vitamin B-12 (CYANOCOBALAMIN) 1000 MCG tablet Take 1,000 mcg by mouth every morning.    Historical Provider, MD   BP 135/81  Pulse 81  Temp(Src) 97.7 F (36.5 C) (Oral)  Ht 5\' 8"  (1.727 m)  Wt 168 lb (76.204 kg)  BMI 25.55 kg/m2  SpO2 94%  Physical Exam  Nursing  note and vitals reviewed. Constitutional: He is oriented to person, place, and time. He appears well-developed and well-nourished. No distress.  HENT:  Head: Normocephalic and atraumatic.  Eyes: EOM are normal.  Neck: Neck supple.  C-spine non tender.    Cardiovascular: Normal rate, regular rhythm and normal heart sounds.   No murmur heard. Pulmonary/Chest: Effort normal and breath sounds normal. No respiratory distress. He has no wheezes. He has no rales. He exhibits no tenderness.  Abdominal: Soft. There is no tenderness.  Musculoskeletal: Normal range of motion.  Tenderness and mild ecchymosis over left 5th digit. Pelvis is stable. Mild tenderness over left hip without LE shortening or rotation.   Neurological: He is alert and oriented to person, place, and time.  Distal neurovascular intactx4.   Skin: Skin is warm and dry.  Psychiatric: He has a normal mood and affect. His behavior is normal.    ED Course  Procedures (including critical care time)  DIAGNOSTIC STUDIES: Oxygen Saturation is 94% on room air, adequate by my interpretation.    COORDINATION OF CARE: 11:38 PM-Discussed treatment plan with pt at bedside and pt agreed to plan.   Results for orders placed during the hospital encounter of 11/29/13  BASIC METABOLIC PANEL      Result Value Ref Range   Sodium 135 (*) 137 - 147 mEq/L   Potassium 4.0  3.7 -  5.3 mEq/L   Chloride 96  96 - 112 mEq/L   CO2 25  19 - 32 mEq/L   Glucose, Bld 129 (*) 70 - 99 mg/dL   BUN 16  6 - 23 mg/dL   Creatinine, Ser 4.090.95  0.50 - 1.35 mg/dL   Calcium 9.2  8.4 - 81.110.5 mg/dL   GFR calc non Af Amer 84 (*) >90 mL/min   GFR calc Af Amer >90  >90 mL/min  CBC WITH DIFFERENTIAL      Result Value Ref Range   WBC 10.9 (*) 4.0 - 10.5 K/uL   RBC 4.47  4.22 - 5.81 MIL/uL   Hemoglobin 13.6  13.0 - 17.0 g/dL   HCT 91.440.9  78.239.0 - 95.652.0 %   MCV 91.5  78.0 - 100.0 fL   MCH 30.4  26.0 - 34.0 pg   MCHC 33.3  30.0 - 36.0 g/dL   RDW 21.314.2  08.611.5 - 57.815.5 %   Platelets 163  150 - 400 K/uL   Neutrophils Relative % 70  43 - 77 %   Neutro Abs 7.6  1.7 - 7.7 K/uL   Lymphocytes Relative 20  12 - 46 %   Lymphs Abs 2.2  0.7 - 4.0 K/uL   Monocytes Relative 7  3 - 12 %   Monocytes Absolute 0.8  0.1 - 1.0 K/uL   Eosinophils Relative 3  0 - 5 %   Eosinophils Absolute 0.3  0.0 - 0.7 K/uL   Basophils Relative 0  0 - 1 %   Basophils Absolute 0.0  0.0 - 0.1 K/uL  PROTIME-INR      Result Value Ref Range   Prothrombin Time 13.7  11.6 - 15.2 seconds   INR 1.07  0.00 - 1.49  URINALYSIS, ROUTINE W REFLEX MICROSCOPIC      Result Value Ref Range   Color, Urine YELLOW  YELLOW   APPearance CLEAR  CLEAR   Specific Gravity, Urine >1.030 (*) 1.005 - 1.030   pH 5.5  5.0 - 8.0   Glucose, UA 500 (*) NEGATIVE mg/dL   Hgb urine dipstick  TRACE (*) NEGATIVE   Bilirubin Urine NEGATIVE  NEGATIVE   Ketones, ur TRACE (*) NEGATIVE mg/dL   Protein, ur NEGATIVE  NEGATIVE mg/dL   Urobilinogen, UA 0.2  0.0 - 1.0 mg/dL   Nitrite NEGATIVE  NEGATIVE   Leukocytes, UA NEGATIVE  NEGATIVE  URINE MICROSCOPIC-ADD ON      Result Value Ref Range   Squamous Epithelial / LPF FEW (*) RARE   WBC, UA 0-2  <3 WBC/hpf   RBC / HPF 0-2  <3 RBC/hpf   Bacteria, UA FEW (*) RARE  TYPE AND SCREEN      Result Value Ref Range   ABO/RH(D) A POS     Antibody Screen NEG     Sample Expiration 12/02/2013     Dg Chest 1  View  11/30/2013   CLINICAL DATA:  FALL  EXAM: CHEST - 1 VIEW  COMPARISON:  DG CHEST 1 VIEW dated 09/21/2012  FINDINGS: Cardiac silhouette within normal limits. Aorta is tortuous and ectatic. Stable linear areas of density within the left lung base and periphery of the left hemithorax. No new focal regions of consolidation or focal infiltrates. No acute osseous abnormalities.  IMPRESSION: Areas of chronic scarring versus atelectasis left hemithorax. No acute cardiopulmonary disease.   Electronically Signed   By: Salome Holmes M.D.   On: 11/30/2013 00:40   Dg Hip Complete Left  11/30/2013   CLINICAL DATA:  FALL  EXAM: LEFT HIP - COMPLETE 2+ VIEW  COMPARISON:  None.  FINDINGS: A nondisplaced fracture within the proximal portion of the superior pubic ramus on the left extending into the acetabulum. Osteoarthritic changes within the right and left hips. Degenerative changes within the lower lumbar spine.  IMPRESSION: Superior pubic ramus fracture on the left.   Electronically Signed   By: Salome Holmes M.D.   On: 11/30/2013 00:43   Ct Head Wo Contrast  11/30/2013   CLINICAL DATA:  Fall, history of dementia.  EXAM: CT HEAD WITHOUT CONTRAST  CT CERVICAL SPINE WITHOUT CONTRAST  TECHNIQUE: Multidetector CT imaging of the head and cervical spine was performed following the standard protocol without intravenous contrast. Multiplanar CT image reconstructions of the cervical spine were also generated.  COMPARISON:  CT HEAD W/O CM dated 09/21/2012; CT C SPINE W/O CM dated 09/21/2012  FINDINGS: CT HEAD FINDINGS  Moderate to severe ventriculomegaly, predominantly on the basis of global brain volume loss though, there is very mild disproportionate sulcal crowding at the convexities. No intraparenchymal hemorrhage, mass effect nor midline shift. Patchy supratentorial white matter hypodensities are within normal range for patient's age and though non-specific suggest sequelae of chronic small vessel ischemic disease. No acute  large vascular territory infarcts.  No abnormal extra-axial fluid collections. Basal cisterns are patent. Mild calcific atherosclerosis of the carotid siphons.  No skull fracture. The included ocular globes and orbital contents are non-suspicious. Apparent remote left medial orbital blowout fracture. Mild paranasal sinus mucosal thickening, decreased from prior examination. Patient is edentulous.  CT CERVICAL SPINE FINDINGS  Cervical vertebral bodies and posterior elements remain intact and aligned with maintenance of the cervical lordosis. Moderate to severe C5-6 and C6-7 degenerative disc disease. Mild C4-5 degenerative disc disease. C1-2 articulation maintained with severe arthropathy. No destructive bony lesions. Included view of the lung apices demonstrates severe centrilobular emphysema and apical bullous changes. Severe calcific atherosclerosis of the right carotid bulb with central luminal calcification.  Degenerative disc disease and facet arthropathy result in mild to moderate canal stenosis at C5-6. Severe right  C4-5 and moderate to severe left C4-5 neural foraminal narrowing. Severe right C5-6 neural foraminal narrowing.  IMPRESSION: CT head:  No acute intracranial process.  Stable moderate to severe global brain atrophy, advanced for age with possible component of normal pressure hydrocephalus, similar. Mild to moderate white matter changes suggest chronic small vessel ischemic disease.  CT cervical spine: Similar cervical spondylosis without acute fracture nor malalignment.  Severe calcific atherosclerosis of the right carotid bulb, if clinical concern for hemodynamically significant stenosis, consider CT angiogram of the neck on a nonemergent basis.   Electronically Signed   By: Awilda Metroourtnay  Bloomer   On: 11/30/2013 01:05   Ct Cervical Spine Wo Contrast  11/30/2013   CLINICAL DATA:  Fall, history of dementia.  EXAM: CT HEAD WITHOUT CONTRAST  CT CERVICAL SPINE WITHOUT CONTRAST  TECHNIQUE: Multidetector  CT imaging of the head and cervical spine was performed following the standard protocol without intravenous contrast. Multiplanar CT image reconstructions of the cervical spine were also generated.  COMPARISON:  CT HEAD W/O CM dated 09/21/2012; CT C SPINE W/O CM dated 09/21/2012  FINDINGS: CT HEAD FINDINGS  Moderate to severe ventriculomegaly, predominantly on the basis of global brain volume loss though, there is very mild disproportionate sulcal crowding at the convexities. No intraparenchymal hemorrhage, mass effect nor midline shift. Patchy supratentorial white matter hypodensities are within normal range for patient's age and though non-specific suggest sequelae of chronic small vessel ischemic disease. No acute large vascular territory infarcts.  No abnormal extra-axial fluid collections. Basal cisterns are patent. Mild calcific atherosclerosis of the carotid siphons.  No skull fracture. The included ocular globes and orbital contents are non-suspicious. Apparent remote left medial orbital blowout fracture. Mild paranasal sinus mucosal thickening, decreased from prior examination. Patient is edentulous.  CT CERVICAL SPINE FINDINGS  Cervical vertebral bodies and posterior elements remain intact and aligned with maintenance of the cervical lordosis. Moderate to severe C5-6 and C6-7 degenerative disc disease. Mild C4-5 degenerative disc disease. C1-2 articulation maintained with severe arthropathy. No destructive bony lesions. Included view of the lung apices demonstrates severe centrilobular emphysema and apical bullous changes. Severe calcific atherosclerosis of the right carotid bulb with central luminal calcification.  Degenerative disc disease and facet arthropathy result in mild to moderate canal stenosis at C5-6. Severe right C4-5 and moderate to severe left C4-5 neural foraminal narrowing. Severe right C5-6 neural foraminal narrowing.  IMPRESSION: CT head:  No acute intracranial process.  Stable moderate to  severe global brain atrophy, advanced for age with possible component of normal pressure hydrocephalus, similar. Mild to moderate white matter changes suggest chronic small vessel ischemic disease.  CT cervical spine: Similar cervical spondylosis without acute fracture nor malalignment.  Severe calcific atherosclerosis of the right carotid bulb, if clinical concern for hemodynamically significant stenosis, consider CT angiogram of the neck on a nonemergent basis.   Electronically Signed   By: Awilda Metroourtnay  Bloomer   On: 11/30/2013 01:05   Dg Finger Little Left  11/30/2013   CLINICAL DATA:  FALL FALL  EXAM: LEFT LITTLE FINGER 2+V  COMPARISON:  None.  FINDINGS: Fracture along the base proximal phalanx fifth digit with minimal dorsal angulation. In no evidence of displacement. Chronic appearing fracture involving the fifth metacarpal.  IMPRESSION: Minimally angulated fracture base proximal phalanx fifth digit.   Electronically Signed   By: Salome HolmesHector  Cooper M.D.   On: 11/30/2013 00:39     EKG Interpretation   Date/Time:  Monday Nov 29 2013 23:25:45 EDT Ventricular Rate:  80  PR Interval:  201 QRS Duration: 85 QT Interval:  369 QTC Calculation: 426 R Axis:   79 Text Interpretation:  Sinus rhythm Low voltage, precordial leads  Nonspecific ST and T wave abnormality Confirmed by Rakiya Krawczyk  MD, Jovon Winterhalter  ([id number]) on 11/30/2013 12:11:40 AM     Medications  fentaNYL (SUBLIMAZE) injection 50 mcg (50 mcg Intravenous Given 11/29/13 2339)  ondansetron (ZOFRAN) injection 4 mg (4 mg Intravenous Given 11/29/13 2339)   Plan discharge back facility with pain medications as needed. Splint Precautions. Follow up with orthopedics. Will also followup with primary care physician for abnormal CT as above - carotid abnormality requiring further evaluation. MDM   Diagnosis: Superior pubic ramus fracture left, proximal phalanx fifth digit fracture closed left, fall  Nursing home patient with mechanical fall, evaluated with EKG labs  and imaging reviewed as above. Splint placed left upper extremity with distal neurovascular intact after placement. Sling provided. Patient able to bear weight, will require walker and wheelchair at nursing facility. Vital signs and nursing notes reviewed and considered Previous EMR records reviewed - suffer Romeo Apple in one year ago for pelvic fracture, will refer back to Dr. Romeo Apple.   I personally performed the services described in this documentation, which was scribed in my presence. The recorded information has been reviewed and is accurate.    [name], MD 11/30/13 [phone]

## 2013-11-30 ENCOUNTER — Telehealth: Payer: Self-pay | Admitting: Orthopedic Surgery

## 2013-11-30 LAB — URINALYSIS, ROUTINE W REFLEX MICROSCOPIC
Bilirubin Urine: NEGATIVE
Glucose, UA: 500 mg/dL — AB
Leukocytes, UA: NEGATIVE
Nitrite: NEGATIVE
Protein, ur: NEGATIVE mg/dL
Specific Gravity, Urine: >1.03 — ABNORMAL HIGH (ref 1.005–1.030)
Urobilinogen, UA: 0.2 mg/dL (ref 0.0–1.0)
pH: 5.5 (ref 5.0–8.0)

## 2013-11-30 LAB — URINE MICROSCOPIC-ADD ON

## 2013-11-30 LAB — TYPE AND SCREEN
ABO/RH(D): A POS
Antibody Screen: NEGATIVE

## 2013-11-30 LAB — BASIC METABOLIC PANEL
BUN: 16 mg/dL (ref 6–23)
CO2: 25 mEq/L (ref 19–32)
Calcium: 9.2 mg/dL (ref 8.4–10.5)
Chloride: 96 mEq/L (ref 96–112)
Creatinine, Ser: 0.95 mg/dL (ref 0.50–1.35)
GFR calc Af Amer: >90 mL/min (ref >90)
GFR calc non Af Amer: 84 mL/min — ABNORMAL LOW (ref >90)
Glucose, Bld: 129 mg/dL — ABNORMAL HIGH (ref 70–99)
Potassium: 4 mEq/L (ref 3.7–5.3)
Sodium: 135 mEq/L — ABNORMAL LOW (ref 137–147)

## 2013-11-30 MED ORDER — HYDROCODONE-ACETAMINOPHEN 5-325 MG PO TABS: 1.0000 | ORAL_TABLET | Freq: Four times a day (QID) | ORAL | Status: DC | PRN

## 2013-11-30 NOTE — Telephone Encounter (Signed)
[facility]; Judeth CornfieldStephanie has left for the day; spoke with nurse supervisor, Darl PikesSusan, who will try to reach someone in transportation.  To call us back as soon as possible in order to arrange the appointment promptly.

## 2013-11-30 NOTE — Discharge Instructions (Signed)
Finger Fracture  Fractures of fingers are breaks in the bones of the fingers. There are many types of fractures. There are different ways of treating these fractures. Your health care provider will discuss the best way to treat your fracture.  CAUSES  Traumatic injury is the main cause of broken fingers. These include:  · Injuries while playing sports.  · Workplace injuries.  · Falls.  RISK FACTORS  Activities that can increase your risk of finger fractures include:  · Sports.  · Workplace activities that involve machinery.  · A condition called osteoporosis, which can make your bones less dense and cause them to fracture more easily.  SIGNS AND SYMPTOMS  The main symptoms of a broken finger are pain and swelling within 15 minutes after the injury. Other symptoms include:  · Bruising of your finger.  · Stiffness of your finger.  · Numbness of your finger.  · Exposed bones (compound fracture) if the fracture is severe.  DIAGNOSIS   The best way to diagnose a broken bone is with X-ray imaging. Additionally, your health care provider will use this X-ray image to evaluate the position of the broken finger bones.   TREATMENT   Finger fractures can be treated with:   · Nonreduction This means the bones are in place. The finger is splinted without changing the positions of the bone pieces. The splint is usually left on for about a week to 10 days. This will depend on your fracture and what your health care provider thinks.  · Closed reduction The bones are put back into position without using surgery. The finger is then splinted.  · Open reduction and internal fixation The fracture site is opened. Then the bone pieces are fixed into place with pins or some type of hardware. This is seldom required. It depends on the severity of the fracture.  HOME CARE INSTRUCTIONS   · Follow your health care provider's instructions regarding activities, exercises, and physical therapy.  · Only take over-the-counter or prescription  medicines for pain, discomfort, or fever as directed by your health care provider.  SEEK MEDICAL CARE IF:  You have pain or swelling that limits the motion or use of your fingers.  SEEK IMMEDIATE MEDICAL CARE IF:   Your finger becomes numb.  MAKE SURE YOU:   · Understand these instructions.  · Will watch your condition.  · Will get help right away if you are not doing well or get worse.  Document Released: 10/13/2000 Document Revised: 04/21/2013 Document Reviewed: 02/10/2013  [employer]® Patient Information ©2014 [employer].

## 2013-11-30 NOTE — Telephone Encounter (Signed)
[facility], per [place]; states resident was seen at [facility] last night following a fall out of a chair.  He is a previous patient of Dr Mort [facility]; therefore, referred back to our office.  Per chart note, patient has pubic ramus fracture and finger fracture, and was advised to see orthopedics in 1 to 2 days, which we initially offered.  Please review and advise regarding follow up, and transport - wheelchair Vs. EMS.  I relayed that our office would likely be unable to accomodate if patient were transported on stretcher.  [facility] [phone].

## 2013-11-30 NOTE — Telephone Encounter (Signed)
Appointment scheduled and confirmed per Roselyn BeringSusan W, nurse supervisor, for tomorrow, 12/01/13.

## 2013-11-30 NOTE — Telephone Encounter (Signed)
Wed am 0900 or June 8th  Final answer :)

## 2013-12-01 ENCOUNTER — Ambulatory Visit (INDEPENDENT_AMBULATORY_CARE_PROVIDER_SITE_OTHER): Payer: PRIVATE HEALTH INSURANCE | Admitting: Orthopedic Surgery

## 2013-12-01 ENCOUNTER — Encounter: Payer: Self-pay | Admitting: Orthopedic Surgery

## 2013-12-01 VITALS — BP 127/74 | Ht 68.0 in | Wt 168.0 lb

## 2013-12-01 DIAGNOSIS — S62339A Displaced fracture of neck of unspecified metacarpal bone, initial encounter for closed fracture: Secondary | ICD-10-CM

## 2013-12-01 DIAGNOSIS — S329XXA Fracture of unspecified parts of lumbosacral spine and pelvis, initial encounter for closed fracture: Secondary | ICD-10-CM | POA: Insufficient documentation

## 2013-12-01 DIAGNOSIS — S62619A Displaced fracture of proximal phalanx of unspecified finger, initial encounter for closed fracture: Secondary | ICD-10-CM

## 2013-12-01 DIAGNOSIS — IMO0002 Reserved for concepts with insufficient information to code with codable children: Secondary | ICD-10-CM

## 2013-12-01 NOTE — Patient Instructions (Signed)
xrays left hand cast off in 3 weeks

## 2013-12-01 NOTE — Progress Notes (Signed)
  Subjective:    Joseph Bowen is a 68 y.o. male who presents with left hip pain. Onset of the symptoms was 2 days ago . Inciting event: fell while stepping onto a stoll to get remote. The patient reports the hip pain is worse with weight bearing and is aggravated by walking. Aggravating symptoms include: any weight bearing, rising after sitting, standing and walking. Patient had right pelvic fracture last year  prior hip problems. Previous visits for this problem: none. Evaluation to date: plain films, which were abnormal  superior pubic ramus fracture  and also c/o pain left hand, xrays show proximal phalanx and metacarpal neck fracture min displcament . Treatment to date: rest .  The following portions of the patient's history were reviewed and updated as appropriate: allergies, current medications, past family history, past medical history, past social history, past surgical history and problem list.   Review of Systems A comprehensive review of systems was negative.   Objective:    BP 127/74  Ht _0  (1.727 m)  Wt 168 lb (76.204 kg)  BMI 25.55 kg/m2 His overall appearance is that he is well-kept well-groomed clean. He is oriented to person and place. His mood is pleasant his affect is flat. He is ambulatory with a wheelchair.  His right upper extremity looks normal, as no range of motion restriction, no instability. Muscle tone is normal. Skin is normal.  His left upper extremity has swelling and tenderness in the palm and dorsal aspect of the fifth metacarpal and small finger with ecchymosis in the skin decreased range of motion but no instability. Hyperthenar musculature has normal muscle tone. Other than the subcutaneous ecchymosis around the area of tenderness which is at the distal fifth met carpal and proximal phalanx of the small finger the skin is normal. Radial and ulnar pulses are normal.  There is no lymphadenopathy in the upper extremities  The right lower extremity is nontender  but he does have some groin pain with range of motion and the people that are with him today said that since his pelvic fracture. The hip is stable. Muscle tone is normal. Skin is normal. Distal pulses are intact. Sensation is normal.  Left hip and groin area are nontender to palpation he has some tenderness in the symphysis pubic area. Has painful range of motion but no instability muscle tone is normal scans intact the distal pulses normal sensation no pathologic reflexes  His balance is poor although not tested. This is history of his caregivers.    Imaging: X-rays and CT scans were done. He has a superior pubic ramus fracture on the left minimal displacement. As the proximal phalanx fracture right small finger and a distal metacarpal neck fracture right fifth metacarpal   X-ray C. imaging above: no fracture, dislocation, swelling or degenerative changes noted and As noted    Assessment:    Pelvic fracture, metacarpal neck fracture right hand didn't fifth metacarpal, right proximal phalanx fracture small finger. Note left pelvic fracture on this occasion.    Plan:    Short arm cast with outrigger placed on the left hand. Weightbearing as tolerated daily physical therapy for the pelvic fracture. Return 3 weeks x-ray out of plaster left hand.

## 2013-12-23 ENCOUNTER — Encounter: Payer: Self-pay | Admitting: Orthopedic Surgery

## 2013-12-23 ENCOUNTER — Ambulatory Visit (INDEPENDENT_AMBULATORY_CARE_PROVIDER_SITE_OTHER): Payer: Self-pay | Admitting: Orthopedic Surgery

## 2013-12-23 ENCOUNTER — Ambulatory Visit (INDEPENDENT_AMBULATORY_CARE_PROVIDER_SITE_OTHER): Payer: PRIVATE HEALTH INSURANCE

## 2013-12-23 DIAGNOSIS — M79609 Pain in unspecified limb: Secondary | ICD-10-CM

## 2013-12-23 DIAGNOSIS — M79645 Pain in left finger(s): Secondary | ICD-10-CM

## 2013-12-23 NOTE — Progress Notes (Signed)
Patient ID: Joseph Bowen, male   DOB: May 17, 1946, 68 y.o.   MRN: [id number]  Encounter Diagnosis  Name Primary?  . Finger pain, left Yes   Chief Complaint  Patient presents with  . Hand Pain    LSF fracture prox phal     Came in cast partially off, doubt he can be compliant   xrays : fracture healed.  F/u prn

## 2014-01-13 ENCOUNTER — Encounter: Payer: Self-pay | Admitting: Orthopedic Surgery

## 2014-01-13 ENCOUNTER — Ambulatory Visit (INDEPENDENT_AMBULATORY_CARE_PROVIDER_SITE_OTHER): Payer: PRIVATE HEALTH INSURANCE

## 2014-01-13 ENCOUNTER — Ambulatory Visit (INDEPENDENT_AMBULATORY_CARE_PROVIDER_SITE_OTHER): Payer: Self-pay | Admitting: Orthopedic Surgery

## 2014-01-13 DIAGNOSIS — IMO0001 Reserved for inherently not codable concepts without codable children: Secondary | ICD-10-CM

## 2014-01-13 DIAGNOSIS — S329XXA Fracture of unspecified parts of lumbosacral spine and pelvis, initial encounter for closed fracture: Secondary | ICD-10-CM

## 2014-01-13 DIAGNOSIS — S329XXD Fracture of unspecified parts of lumbosacral spine and pelvis, subsequent encounter for fracture with routine healing: Secondary | ICD-10-CM

## 2014-01-13 NOTE — Progress Notes (Signed)
Patient ID: Joseph MiniumDavid Zaragosa, male   DOB: 08/22/1945, 68 y.o.   MRN: [id number]  Status post left pelvic fracture 6 weeks x-ray followup. Patient walking standby assist mild discomfort. X-ray shows healing of the fracture minimal displacement  Followup as needed continue weightbearing as tolerated.

## 2014-01-13 NOTE — Patient Instructions (Signed)
Weightbearing as tolerated advance gait as tolerated

## 2014-06-06 ENCOUNTER — Encounter (HOSPITAL_COMMUNITY): Payer: Self-pay

## 2014-06-06 ENCOUNTER — Emergency Department (HOSPITAL_COMMUNITY): Payer: PRIVATE HEALTH INSURANCE

## 2014-06-06 ENCOUNTER — Emergency Department (HOSPITAL_COMMUNITY)
Admission: EM | Admit: 2014-06-06 | Discharge: 2014-06-07 | Disposition: A | Discharge status: Home / Self Care | Payer: PRIVATE HEALTH INSURANCE | Attending: Emergency Medicine | Admitting: Emergency Medicine

## 2014-06-06 DIAGNOSIS — E559 Vitamin D deficiency, unspecified: Secondary | ICD-10-CM | POA: Diagnosis not present

## 2014-06-06 DIAGNOSIS — F419 Anxiety disorder, unspecified: Secondary | ICD-10-CM | POA: Diagnosis not present

## 2014-06-06 DIAGNOSIS — F329 Major depressive disorder, single episode, unspecified: Secondary | ICD-10-CM | POA: Diagnosis not present

## 2014-06-06 DIAGNOSIS — Z008 Encounter for other general examination: Secondary | ICD-10-CM | POA: Insufficient documentation

## 2014-06-06 DIAGNOSIS — E119 Type 2 diabetes mellitus without complications: Secondary | ICD-10-CM | POA: Insufficient documentation

## 2014-06-06 DIAGNOSIS — H409 Unspecified glaucoma: Secondary | ICD-10-CM | POA: Diagnosis not present

## 2014-06-06 DIAGNOSIS — E539 Vitamin B deficiency, unspecified: Secondary | ICD-10-CM | POA: Insufficient documentation

## 2014-06-06 DIAGNOSIS — Z79899 Other long term (current) drug therapy: Secondary | ICD-10-CM | POA: Insufficient documentation

## 2014-06-06 DIAGNOSIS — Z7982 Long term (current) use of aspirin: Secondary | ICD-10-CM | POA: Diagnosis not present

## 2014-06-06 DIAGNOSIS — Z8719 Personal history of other diseases of the digestive system: Secondary | ICD-10-CM | POA: Insufficient documentation

## 2014-06-06 DIAGNOSIS — Z862 Personal history of diseases of the blood and blood-forming organs and certain disorders involving the immune mechanism: Secondary | ICD-10-CM | POA: Diagnosis not present

## 2014-06-06 DIAGNOSIS — Z794 Long term (current) use of insulin: Secondary | ICD-10-CM | POA: Diagnosis not present

## 2014-06-06 DIAGNOSIS — F4310 Post-traumatic stress disorder, unspecified: Secondary | ICD-10-CM | POA: Diagnosis not present

## 2014-06-06 DIAGNOSIS — J449 Chronic obstructive pulmonary disease, unspecified: Secondary | ICD-10-CM | POA: Diagnosis not present

## 2014-06-06 DIAGNOSIS — I10 Essential (primary) hypertension: Secondary | ICD-10-CM | POA: Diagnosis not present

## 2014-06-06 DIAGNOSIS — Z87448 Personal history of other diseases of urinary system: Secondary | ICD-10-CM | POA: Diagnosis not present

## 2014-06-06 DIAGNOSIS — F0390 Unspecified dementia without behavioral disturbance: Secondary | ICD-10-CM | POA: Diagnosis not present

## 2014-06-06 DIAGNOSIS — Z792 Long term (current) use of antibiotics: Secondary | ICD-10-CM | POA: Insufficient documentation

## 2014-06-06 HISTORY — DX: Unspecified psychosis not due to a substance or known physiological condition: F29

## 2014-06-06 LAB — CBC WITH DIFFERENTIAL/PLATELET
Basophils Absolute: 0 10*3/uL (ref 0.0–0.1)
Basophils Relative: 0 % (ref 0–1)
Eosinophils Absolute: 0.2 10*3/uL (ref 0.0–0.7)
Eosinophils Relative: 2 % (ref 0–5)
HCT: 44.2 % (ref 39.0–52.0)
Hemoglobin: 14.9 g/dL (ref 13.0–17.0)
Lymphocytes Relative: 28 % (ref 12–46)
Lymphs Abs: 2.1 10*3/uL (ref 0.7–4.0)
MCH: 29.9 pg (ref 26.0–34.0)
MCHC: 33.7 g/dL (ref 30.0–36.0)
MCV: 88.8 fL (ref 78.0–100.0)
Monocytes Absolute: 0.5 10*3/uL (ref 0.1–1.0)
Monocytes Relative: 7 % (ref 3–12)
Neutro Abs: 4.7 10*3/uL (ref 1.7–7.7)
Neutrophils Relative %: 63 % (ref 43–77)
Platelets: 229 10*3/uL (ref 150–400)
RBC: 4.98 MIL/uL (ref 4.22–5.81)
RDW: 14.2 % (ref 11.5–15.5)
WBC: 7.5 10*3/uL (ref 4.0–10.5)

## 2014-06-06 LAB — COMPREHENSIVE METABOLIC PANEL
ALT: 9 U/L (ref 0–53)
AST: 14 U/L (ref 0–37)
Albumin: 4.2 g/dL (ref 3.5–5.2)
Alkaline Phosphatase: 62 U/L (ref 39–117)
Anion gap: 22 — ABNORMAL HIGH (ref 5–15)
BUN: 15 mg/dL (ref 6–23)
CO2: 20 mEq/L (ref 19–32)
Calcium: 10.5 mg/dL (ref 8.4–10.5)
Chloride: 94 mEq/L — ABNORMAL LOW (ref 96–112)
Creatinine, Ser: 0.87 mg/dL (ref 0.50–1.35)
GFR calc Af Amer: >90 mL/min (ref >90)
GFR calc non Af Amer: 87 mL/min — ABNORMAL LOW (ref >90)
Glucose, Bld: 142 mg/dL — ABNORMAL HIGH (ref 70–99)
Potassium: 4.1 mEq/L (ref 3.7–5.3)
Sodium: 136 mEq/L — ABNORMAL LOW (ref 137–147)
Total Bilirubin: 0.3 mg/dL (ref 0.3–1.2)
Total Protein: 7.8 g/dL (ref 6.0–8.3)

## 2014-06-06 LAB — URINALYSIS, ROUTINE W REFLEX MICROSCOPIC
Bilirubin Urine: NEGATIVE
Glucose, UA: 250 mg/dL — AB
Hgb urine dipstick: NEGATIVE
Ketones, ur: NEGATIVE mg/dL
Leukocytes, UA: NEGATIVE
Nitrite: NEGATIVE
Protein, ur: NEGATIVE mg/dL
Specific Gravity, Urine: >1.03 — ABNORMAL HIGH (ref 1.005–1.030)
Urobilinogen, UA: 0.2 mg/dL (ref 0.0–1.0)
pH: 5.5 (ref 5.0–8.0)

## 2014-06-06 LAB — ETHANOL: Alcohol, Ethyl (B): <11 mg/dL (ref 0–11)

## 2014-06-06 LAB — RAPID URINE DRUG SCREEN, HOSP PERFORMED
Amphetamines: NOT DETECTED
Barbiturates: NOT DETECTED
Benzodiazepines: NOT DETECTED
Cocaine: NOT DETECTED
Opiates: NOT DETECTED
Tetrahydrocannabinol: NOT DETECTED

## 2014-06-06 LAB — LIPASE, BLOOD: Lipase: 7 U/L — ABNORMAL LOW (ref 11–59)
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
## 2014-06-06 NOTE — Discharge Instructions (Signed)
Patient with medical screening here for possible psychiatric admission. Patient's medical screening was negative. Patient will behaved here. Discussed with nursing home facility. They are willing to take the patient back. Patient cleared for return back to nursing home.

## 2014-06-06 NOTE — ED Notes (Signed)
For discharge, [facility] come for pt, [facility]
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
## 2014-07-04 ENCOUNTER — Ambulatory Visit (INDEPENDENT_AMBULATORY_CARE_PROVIDER_SITE_OTHER): Payer: PRIVATE HEALTH INSURANCE | Admitting: Cardiology

## 2014-07-04 ENCOUNTER — Encounter: Payer: Self-pay | Admitting: Cardiology

## 2014-07-04 VITALS — BP 102/70 | HR 89 | Ht 68.0 in | Wt 156.0 lb

## 2014-07-04 DIAGNOSIS — R42 Dizziness and giddiness: Secondary | ICD-10-CM

## 2014-07-04 DIAGNOSIS — E1165 Type 2 diabetes mellitus with hyperglycemia: Secondary | ICD-10-CM | POA: Insufficient documentation

## 2014-07-04 DIAGNOSIS — E118 Type 2 diabetes mellitus with unspecified complications: Secondary | ICD-10-CM

## 2014-07-04 DIAGNOSIS — E119 Type 2 diabetes mellitus without complications: Secondary | ICD-10-CM | POA: Insufficient documentation

## 2014-07-04 DIAGNOSIS — R55 Syncope and collapse: Secondary | ICD-10-CM

## 2014-07-04 NOTE — Progress Notes (Signed)
Reason for visit: Dizziness  Clinical Summary [name] is a 68 y.o.male referred for cardiology consultation by Ms Joseph BondsBurkart NP. [facility], past medical history outlined below. I reviewed the available records. [facility] after a fall that occurred in the shower, this was associated with dizziness, not entirely clear whether he had true syncope or not. Limited documentation indicates that he may have been orthostatic, possibly even hypoglycemic at the time. He is here with an Geophysicist/field seismologistassistant today. He does not provide very much history, also has problems with short-term memory. He does not recall the details of the fall in the shower, and apparently he had 3 more recent events as well as. Assistant tells me that he had an episode of "passing out" when he was seated playing chess, and then 2 additional spells of unresponsiveness while he was lying down in the bed subsequently thereafter. Possibly documented to have some degree of hypoglycemia by EMS when he was transported to the ER. No other vital signs available. My understanding is that he has been taken off of lisinopril.  Additional medications include Zyprexa and Effexor or, potentially associated with dizziness. He is also on prazosin which can further exacerbate orthostasis.  I reviewed his recent ECG from November, normal sinus rhythm with normal intervals.  CT scan of the head in November showed no acute intracranial injury with evidence of atrophy, ventriculomegaly, and chronic small vessel disease. Lab work from November showed BUN 14, creatinine 0.9, potassium 4.1, negative troponin I, hemoglobin 12.4, platelet 194.   No Known Allergies  Current Outpatient Prescriptions  Medication Sig Dispense Refill  . acetaminophen (TYLENOL) 325 MG tablet Take 650 mg by mouth every 6 (six) hours as needed.    Marland Kitchen. aspirin EC 81 MG tablet Take 81 mg by mouth daily.    . brimonidine  (ALPHAGAN) 0.2 % ophthalmic solution Place 1 drop into both eyes 2 (two) times daily.    Marland Kitchen. gabapentin (NEURONTIN) 300 MG capsule Take 300 mg by mouth 3 (three) times daily.    Marland Kitchen. HYDROcodone-acetaminophen (NORCO/VICODIN) 5-325 MG per tablet Take 1 tablet by mouth every 4 (four) hours as needed for moderate pain or severe pain.     Marland Kitchen. HYDROcodone-acetaminophen (NORCO/VICODIN) 5-325 MG per tablet Take 1 tablet by mouth every 6 (six) hours as needed for pain. 15 tablet 0  . insulin glargine (LANTUS) 100 UNIT/ML injection Inject 27 Units into the skin daily at 12 noon.     Marland Kitchen. levothyroxine (SYNTHROID, LEVOTHROID) 88 MCG tablet Take 88 mcg by mouth daily before breakfast.    . lipase/protease/amylase (CREON-10/PANCREASE) 12000 UNITS CPEP Take 3 capsules by mouth 3 (three) times daily with meals. **Take 30 minutes before meals**    . LORazepam (ATIVAN) 0.5 MG tablet Take one tablet by mouth three times daily; Take one tablet by mouth twice daily as needed for breakthrough anxiety 150 tablet 5  . meclizine (ANTIVERT) 12.5 MG tablet Take 12.5 mg by mouth every 8 (eight) hours as needed for dizziness.    . Melatonin 3 MG TABS Take 3 mg by mouth at bedtime.    . metFORMIN (GLUCOPHAGE) 1000 MG tablet Take 1,000 mg by mouth 2 (two) times daily with a meal.    . OLANZapine (ZYPREXA) 15 MG tablet Take 15 mg by mouth every evening.    . polyethylene glycol (MIRALAX / GLYCOLAX) packet Take 17 g by mouth daily as needed (mix in [place]4-8oz  of liquid prn once daily).    . Propylene Glycol (SYSTANE BALANCE) 0.6 % SOLN Place 1 drop into both eyes at bedtime.    . saccharomyces boulardii (FLORASTOR) 250 MG capsule Take 250 mg by mouth daily.    . sennosides-docusate sodium (SENOKOT-S) 8.6-50 MG tablet Take 1 tablet by mouth 2 (two) times daily.    . sitaGLIPtin (JANUVIA) 50 MG tablet Take 50 mg by mouth daily.    Marland Kitchen. venlafaxine (EFFEXOR) 75 MG tablet Take 225 mg by mouth daily.    . vitamin B-12 (CYANOCOBALAMIN) 1000 MCG tablet  Take 1,000 mcg by mouth every morning.    . Vitamin D, Ergocalciferol, (DRISDOL) 50000 UNITS CAPS capsule Take 50,000 Units by mouth every 30 (thirty) days.     No current facility-administered medications for this visit.    Past Medical History  Diagnosis Date  . Anxiety   . Depression   . Type 2 diabetes mellitus   . Glaucoma   . Dementia   . COPD (chronic obstructive pulmonary disease)   . Anemia   . Hypoglycemia   . Vitamin B deficiency   . Benign prostatic hypertrophy   . Vitamin D deficiency   . Post traumatic stress disorder   . Chronic pancreatitis   . Essential hypertension   . Retinopathy   . Euthyroid sick syndrome   . Personal history of alcoholism   . Psychosis     Past Surgical History  Procedure Laterality Date  . Hand surgery    . Back surgery    . Hip surgery    . Mohs surgery      Melanoma    Family History  Problem Relation Age of Onset  . Heart disease    . Diabetes      Social History [name] reports that he quit smoking about 4 years ago. His smoking use included Cigarettes. He started smoking about 52 years ago. He has a 96 pack-year smoking history. He has never used smokeless tobacco. [name] reports that he does not drink alcohol.  Review of Systems Complete review of systems difficult to obtain due to patient's short-term memory loss.  Physical Examination Filed Vitals:   07/04/14 1510  BP: 102/70  Pulse: 89   Filed Weights   07/04/14 1449  Weight: 156 lb (70.761 kg)   Patient appears comfortable at rest. HEENT: Conjunctiva and lids normal, oropharynx clear with poor dentition.. Neck: Supple, no elevated JVP or carotid bruits, no thyromegaly. Lungs: Clear to auscultation, nonlabored breathing at rest. Cardiac: Regular rate and rhythm, no S3 or significant systolic murmur, no pericardial rub. Abdomen: Soft, nontender, bowel sounds present. Extremities: No pitting edema, distal pulses 2+. Skin: Warm and  dry. Musculoskeletal: No kyphosis. Neuropsychiatric: Alert and oriented x2, affect grossly appropriate. Calm.   Problem List and Plan   Syncope Episodes as outlined above, at this point suspect possible intermittent orthostasis as an etiology particularly in light of medical regimen. Agree with stopping lisinopril, and would also discontinue Prazosin. This may be all that is necessary, although would have his psychiatrist review other medications such as Zyprexa and Effexor or which can sometimes be associated with dizziness. If he has further episodes, particularly that occur while being seated or supine, would suggest a cardiac monitor to exclude arrhythmia, although no evidence of this so far based on his limited evaluation [place] [facility].  Type 2 diabetes mellitus There has been question as to symptomatic hypoglycemia based on record review. Could still potentially be  related to some of his spells. He is on insulin, Januvia, and Glucophage. Keep follow-up with primary care.    Jonelle Sidle, M.D., F.A.C.C.

## 2014-07-04 NOTE — Assessment & Plan Note (Signed)
Episodes as outlined above, at this point suspect possible intermittent orthostasis as an etiology particularly in light of medical regimen. Agree with stopping lisinopril, and would also discontinue Prazosin. This may be all that is necessary, although would have his psychiatrist review other medications such as Zyprexa and Effexor or which can sometimes be associated with dizziness. If he has further episodes, particularly that occur while being seated or supine, would suggest a cardiac monitor to exclude arrhythmia, although no evidence of this so far based on his limited evaluation in [facility].

## 2014-07-04 NOTE — Patient Instructions (Signed)
Your physician recommends that you schedule a follow-up appointment in: as needed. Your physician has recommended you make the following change in your medication:  Stop prozosin or minipress. Continue all other medications the same. Please have your psychiatrist review zyprexa and venlafaxine to see if they could be causing your symptoms.

## 2014-07-04 NOTE — Assessment & Plan Note (Signed)
There has been question as to symptomatic hypoglycemia based on record review. Could still potentially be related to some of his spells. He is on insulin, Januvia, and Glucophage. Keep follow-up with primary care.

## 2014-07-27 ENCOUNTER — Encounter: Payer: Self-pay | Admitting: Neurology

## 2014-07-27 ENCOUNTER — Ambulatory Visit (INDEPENDENT_AMBULATORY_CARE_PROVIDER_SITE_OTHER): Payer: Medicare Other | Admitting: Neurology

## 2014-07-27 VITALS — BP 100/60 | HR 62 | Resp 16 | Ht 69.0 in | Wt 152.0 lb

## 2014-07-27 DIAGNOSIS — R413 Other amnesia: Secondary | ICD-10-CM

## 2014-07-27 DIAGNOSIS — R55 Syncope and collapse: Secondary | ICD-10-CM
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
## 2014-07-27 NOTE — Patient Instructions (Addendum)
1. Schedule MRI brain without contrast 2. Call our office for any change in symptoms 3. Start daily Thiamine 100mg  4. Follow-up in 3 months

## 2014-08-03 ENCOUNTER — Encounter: Payer: Self-pay | Admitting: Neurology

## 2014-08-03 NOTE — Progress Notes (Signed)
Done

## 2014-08-06 ENCOUNTER — Inpatient Hospital Stay: Admission: RE | Admit: 2014-08-06 | Payer: Medicaid Other | Source: Ambulatory Visit

## 2014-08-16 ENCOUNTER — Ambulatory Visit
Admission: RE | Admit: 2014-08-16 | Discharge: 2014-08-16 | Disposition: A | Discharge status: Home / Self Care | Payer: Medicaid Other | Source: Ambulatory Visit | Attending: Neurology | Admitting: Neurology

## 2014-10-26 ENCOUNTER — Encounter: Payer: Self-pay | Admitting: Neurology

## 2014-10-26 ENCOUNTER — Ambulatory Visit (INDEPENDENT_AMBULATORY_CARE_PROVIDER_SITE_OTHER): Payer: Medicare Other | Admitting: Neurology

## 2014-10-26 VITALS — BP 118/76 | HR 81 | Resp 16 | Ht 69.0 in | Wt 155.0 lb

## 2014-10-26 DIAGNOSIS — F1096 Alcohol use, unspecified with alcohol-induced persisting amnestic disorder: Secondary | ICD-10-CM

## 2014-10-26 DIAGNOSIS — R413 Other amnesia: Secondary | ICD-10-CM | POA: Diagnosis not present

## 2014-10-26 MED ORDER — DONEPEZIL HCL 5 MG PO TABS
ORAL_TABLET | ORAL | Status: DC
Start: 2014-10-26 — End: 2015-10-20

## 2014-10-26 NOTE — Patient Instructions (Signed)
1. Start Aricept 5mg  once a day 2. Continue all your other medications 3. Follow-up in 6 months, call our office for any problems

## 2014-10-26 NOTE — Progress Notes (Addendum)
NEUROLOGY FOLLOW UP OFFICE NOTE  Laray Corbit [id number]  HISTORY OF PRESENT ILLNESS: I had the pleasure of seeing Zebbie Ace in follow-up in the neurology clinic on 10/26/2014.  The patient was last seen 3 months ago for memory loss and syncope.  He is again accompanied by his legal guardian [name] and SNF staff who help supplement the history today.  Records and images were personally reviewed where available.  I personally reviewed MRI brain without contrast which showed moderate diffuse volume loss with chronic ventriculomegaly, stable and fairly diminutive appearance of the temporal horns, no transependymal edema suspected.  Since his last visit, they deny any further syncopal episodes after BP medication adjustment. His MOCA score on last visit was 22/30. [name] reports that since his last visit, he had one episode of agitation at the SNF, he though he was in the hospital and was very upset. There is a plan in place for these episodes, he has a book of his history to reorient him, however staff was unable to do this. He was apparently threatening to kick down doors and tried to escape multiple times, which made him even more upset. They had to call law enforcement and transfer him to a different floor. He finally calmed down after [name] spoke to him on the phone. There was no clear trigger for this episode, they do report it was a different staff covering that weekend. He usually knows he is in rehab, but at that time it was odd for him to lose such a large segment of time, thinking he was at [employer] (his home 20 years ago). He recalls trying to get out the building but does not recall much of last week. He feels his book does not help him anymore. He denies any changes in sleep pattern, but does state it takes a while to fall asleep, he takes melatonin.   He denies any headaches, dizziness, diplopia, focal numbness/tingling/weakness. No falls.  HPI: This is a pleasant 69 yo LH man with a  history of hypertension, diabetes, PTSD, who presented for memory loss and syncope. SNF staff reports that they first got involved with Mr. Stangl in December 2011 after he was admitted for hypoglycemia. 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 He went back to his trailer and apparently did not respond to visitors that police broke down his door and found him unresponsive with a blood sugar of 9. He was in a coma for 3 days, and [name] reports that his memory issues became more noticeable. Prior to that, he would get lost driving (also had DUI) and had missed utility bills. He has been at [facility] since then, where his health has overall improved. He is not drinking or smoking anymore. His memory has improved some, however [name] reports that he "cannot keep his life in chronological order." He wakes up some days thinking he is 69 years old and would want to go for a drive, or thinks he is in [place] or [place]. 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 He continues to read voraciously and plays chess, but gets frustrated when he realizes he cannot do certain things. He himself reports that his memory is "bad," mainly with short-term memory. He is not aware of family history of memory issues. He is a [place] veteran, denies any head injuries. He has PTSD and night  terrors, treated with Prazosin per [name]. [name] reports exposure to [employer].  Last November 2015, he was in the shower and fell. It was unclear if he lost consciousness, however on 06/12/14, he was sitting down playing chess when he suddenly slumped down. His blood sugar was 130. He was brought to his bed and woke up, then passed out again while lying on the bed. This happened a third time and his BP was noted to be low. Lisinopril was discontinued. He saw Cardiology and Prazosin dose was reduced. No further syncopal  episodes since then. No worsening of night terrors.   PAST MEDICAL HISTORY: Past Medical History  Diagnosis Date  . Anxiety   . Depression   . Type 2 diabetes mellitus   . Glaucoma   . Dementia   . COPD (chronic obstructive pulmonary disease)   . Anemia   . Hypoglycemia   . Vitamin B deficiency   . Benign prostatic hypertrophy   . Vitamin D deficiency   . Post traumatic stress disorder   . Chronic pancreatitis   . Essential hypertension   . Retinopathy   . Euthyroid sick syndrome   . Personal history of alcoholism   . Psychosis     MEDICATIONS: Current Outpatient Prescriptions on File Prior to Visit  Medication Sig Dispense Refill  . aspirin EC 81 MG tablet Take 81 mg by mouth daily.    Marland Kitchen levothyroxine (SYNTHROID, LEVOTHROID) 88 MCG tablet Take 88 mcg by mouth daily before breakfast.    . lipase/protease/amylase (CREON-10/PANCREASE) 12000 UNITS CPEP Take 3 capsules by mouth 3 (three) times daily with meals. **Take 30 minutes before meals**    . LORazepam (ATIVAN) 0.5 MG tablet Take one tablet by mouth three times daily; Take one tablet by mouth twice daily as needed for breakthrough anxiety 150 tablet 5  . meclizine (ANTIVERT) 12.5 MG tablet Take 12.5 mg by mouth every 8 (eight) hours as needed for dizziness.    . metFORMIN (GLUCOPHAGE) 1000 MG tablet Take 1,000 mg by mouth 2 (two) times daily with a meal.    . prazosin (MINIPRESS) 2 MG capsule Take 2 mg by mouth at bedtime.    Marland Kitchen Propylene Glycol (SYSTANE BALANCE) 0.6 % SOLN Place 1 drop into both eyes at bedtime.    . sennosides-docusate sodium (SENOKOT-S) 8.6-50 MG tablet Take 1 tablet by mouth 2 (two) times daily.    . sitaGLIPtin (JANUVIA) 50 MG tablet Take 50 mg by mouth daily.    Marland Kitchen venlafaxine (EFFEXOR) 75 MG tablet Take 225 mg by mouth daily.    . Vitamin D, Ergocalciferol, (DRISDOL) 50000 UNITS CAPS capsule Take 50,000 Units by mouth every 30 (thirty) days.    Marland Kitchen acetaminophen (TYLENOL) 325 MG tablet Take 650 mg by  mouth every 6 (six) hours as needed.    Marland Kitchen HYDROcodone-acetaminophen (NORCO/VICODIN) 5-325 MG per tablet Take 1 tablet by mouth every 4 (four) hours as needed for moderate pain or severe pain.     . Melatonin 3 MG TABS Take 3 mg by mouth at bedtime.     No current facility-administered medications on file prior to visit.    ALLERGIES: No Known Allergies  FAMILY HISTORY: Family History  Problem Relation Age of Onset  . Heart disease    . Diabetes      SOCIAL HISTORY: History   Social History  . Marital Status: Divorced    Spouse Name: N/A  . Number of Children: N/A  . Years of Education: N/A  Occupational History  . Not on file.   Social History Main Topics  . Smoking status: Former Smoker -- 2.00 packs/day for 48 years    Types: Cigarettes    Start date: 09/17/1961    Quit date: 09/17/2009  . Smokeless tobacco: Never Used  . Alcohol Use: No     Comment: hx of alcoholism  . Drug Use: No  . Sexual Activity: Not on file   Other Topics Concern  . Not on file   Social History Narrative    REVIEW OF SYSTEMS: Constitutional: No fevers, chills, or sweats, no generalized fatigue, change in appetite Eyes: No visual changes, double vision, eye pain Ear, nose and throat: No hearing loss, ear pain, nasal congestion, sore throat Cardiovascular: No chest pain, palpitations Respiratory:  No shortness of breath at rest or with exertion, wheezes GastrointestinaI: No nausea, vomiting, diarrhea, abdominal pain, fecal incontinence Genitourinary:  No dysuria, urinary retention or frequency Musculoskeletal:  No neck pain, back pain Integumentary: No rash, pruritus, skin lesions Neurological: as above Psychiatric: No depression, insomnia, anxiety Endocrine: No palpitations, fatigue, diaphoresis, mood swings, change in appetite, change in weight, increased thirst Hematologic/Lymphatic:  No anemia, purpura, petechiae. Allergic/Immunologic: no itchy/runny eyes, nasal congestion,  recent allergic reactions, rashes  PHYSICAL EXAM: Filed Vitals:   10/26/14 1102  BP: 118/76  Pulse: 81  Resp: 16   General: No acute distress Head:  Normocephalic/atraumatic Neck: supple, no paraspinal tenderness, full range of motion Heart:  Regular rate and rhythm Lungs:  Clear to auscultation bilaterally Back: No paraspinal tenderness Skin/Extremities: No rash, no edema Neurological Exam: alert and oriented to person, place, and time. No aphasia or dysarthria. Fund of knowledge is appropriate.  Remote memory intact. 0/3 delayed recall.  Attention and concentration are normal.    Able to name objects and repeat phrases.  MMSE - Mini Mental State Exam 10/26/2014  Orientation to time 4  Orientation to Place 4  Registration 3  Attention/ Calculation 5  Recall 0  Language- name 2 objects 2  Language- repeat 1  Language- follow 3 step command 3  Language- read & follow direction 1  Write a sentence 1  Copy design 1  Total score 25   Cranial nerves: Pupils equal, round, reactive to light.  Fundoscopic exam unremarkable, no papilledema. Extraocular movements intact with no nystagmus. Visual fields full. Facial sensation intact. No facial asymmetry. Tongue, uvula, palate midline.  Motor: Bulk and tone normal, muscle strength 5/5 throughout with no pronator drift.  Sensation to light touch, temperature and vibration intact.  No extinction to double simultaneous stimulation.  Deep tendon reflexes 2+ throughout except for absent ankle jerks bilaterally, toes downgoing.  Finger to nose testing intact.  Gait narrow-based and steady, able to tandem walk adequately.  Romberg negative. 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 MRI brain without contrast showed volume loss with  chronic ventriculomegaly. MMSE today 25/30. We discussed continued memory problems, he will start low dose Aricept, continue to monitor for aggression. Continue all other medications. No further syncopal episodes since BP medication adjusted. He will follow-up in 6 months and knows to call our office for any changes.   Thank you for allowing me to participate in his care.  Please do not hesitate to call for any questions or concerns.  The duration of this appointment visit was 15 minutes of face-to-face time with the patient.  Greater than 50% of this time was spent in counseling, explanation of diagnosis, planning of further management, and coordination of care.   Patrcia Dolly, M.D.   CC: Dr. Virgina Organ

## 2014-10-31 ENCOUNTER — Encounter: Payer: Self-pay | Admitting: Neurology

## 2014-10-31 DIAGNOSIS — F1096 Alcohol use, unspecified with alcohol-induced persisting amnestic disorder: Secondary | ICD-10-CM | POA: Insufficient documentation

## 2014-10-31 DIAGNOSIS — R413 Other amnesia: Secondary | ICD-10-CM | POA: Insufficient documentation

## 2015-01-22 ENCOUNTER — Emergency Department (HOSPITAL_COMMUNITY)
Admission: EM | Admit: 2015-01-22 | Discharge: 2015-01-22 | Disposition: A | Discharge status: Home / Self Care | Payer: Medicare Other | Attending: Emergency Medicine | Admitting: Emergency Medicine

## 2015-01-22 ENCOUNTER — Encounter (HOSPITAL_COMMUNITY): Payer: Self-pay | Admitting: Emergency Medicine

## 2015-01-22 ENCOUNTER — Emergency Department (HOSPITAL_COMMUNITY): Payer: Medicare Other

## 2015-01-22 DIAGNOSIS — Z79899 Other long term (current) drug therapy: Secondary | ICD-10-CM | POA: Insufficient documentation

## 2015-01-22 DIAGNOSIS — E559 Vitamin D deficiency, unspecified: Secondary | ICD-10-CM | POA: Diagnosis not present

## 2015-01-22 DIAGNOSIS — D649 Anemia, unspecified: Secondary | ICD-10-CM | POA: Insufficient documentation

## 2015-01-22 DIAGNOSIS — Z8719 Personal history of other diseases of the digestive system: Secondary | ICD-10-CM | POA: Diagnosis not present

## 2015-01-22 DIAGNOSIS — I10 Essential (primary) hypertension: Secondary | ICD-10-CM | POA: Insufficient documentation

## 2015-01-22 DIAGNOSIS — E119 Type 2 diabetes mellitus without complications: Secondary | ICD-10-CM | POA: Insufficient documentation

## 2015-01-22 DIAGNOSIS — F329 Major depressive disorder, single episode, unspecified: Secondary | ICD-10-CM | POA: Insufficient documentation

## 2015-01-22 DIAGNOSIS — N400 Enlarged prostate without lower urinary tract symptoms: Secondary | ICD-10-CM | POA: Diagnosis not present

## 2015-01-22 DIAGNOSIS — Z87891 Personal history of nicotine dependence: Secondary | ICD-10-CM | POA: Insufficient documentation

## 2015-01-22 DIAGNOSIS — F419 Anxiety disorder, unspecified: Secondary | ICD-10-CM | POA: Diagnosis not present

## 2015-01-22 DIAGNOSIS — Z794 Long term (current) use of insulin: Secondary | ICD-10-CM | POA: Diagnosis not present

## 2015-01-22 DIAGNOSIS — Z8669 Personal history of other diseases of the nervous system and sense organs: Secondary | ICD-10-CM | POA: Insufficient documentation

## 2015-01-22 DIAGNOSIS — F1310 Sedative, hypnotic or anxiolytic abuse, uncomplicated: Secondary | ICD-10-CM | POA: Insufficient documentation

## 2015-01-22 DIAGNOSIS — F0391 Unspecified dementia with behavioral disturbance: Secondary | ICD-10-CM | POA: Diagnosis not present

## 2015-01-22 DIAGNOSIS — J449 Chronic obstructive pulmonary disease, unspecified: Secondary | ICD-10-CM | POA: Insufficient documentation

## 2015-01-22 DIAGNOSIS — R000 Tachycardia, unspecified: Secondary | ICD-10-CM | POA: Insufficient documentation

## 2015-01-22 DIAGNOSIS — Z7982 Long term (current) use of aspirin: Secondary | ICD-10-CM | POA: Insufficient documentation

## 2015-01-22 DIAGNOSIS — F4310 Post-traumatic stress disorder, unspecified: Secondary | ICD-10-CM | POA: Insufficient documentation

## 2015-01-22 DIAGNOSIS — Z046 Encounter for general psychiatric examination, requested by authority: Secondary | ICD-10-CM | POA: Diagnosis present

## 2015-01-22 LAB — URINALYSIS, ROUTINE W REFLEX MICROSCOPIC
Bilirubin Urine: NEGATIVE
Glucose, UA: >1000 mg/dL — AB
Hgb urine dipstick: NEGATIVE
Ketones, ur: NEGATIVE mg/dL
Leukocytes, UA: NEGATIVE
Nitrite: NEGATIVE
Protein, ur: NEGATIVE mg/dL
Specific Gravity, Urine: 1.02 (ref 1.005–1.030)
Urobilinogen, UA: 0.2 mg/dL (ref 0.0–1.0)
pH: 6 (ref 5.0–8.0)

## 2015-01-22 LAB — RAPID URINE DRUG SCREEN, HOSP PERFORMED
Amphetamines: NOT DETECTED
Barbiturates: NOT DETECTED
Benzodiazepines: POSITIVE — AB
Cocaine: NOT DETECTED
Opiates: NOT DETECTED
Tetrahydrocannabinol: NOT DETECTED

## 2015-01-22 LAB — CBC WITH DIFFERENTIAL/PLATELET
Basophils Absolute: 0 10*3/uL (ref 0.0–0.1)
Basophils Relative: 0 % (ref 0–1)
Eosinophils Absolute: 0.2 10*3/uL (ref 0.0–0.7)
Eosinophils Relative: 3 % (ref 0–5)
HCT: 42.2 % (ref 39.0–52.0)
Hemoglobin: 14 g/dL (ref 13.0–17.0)
Lymphocytes Relative: 27 % (ref 12–46)
Lymphs Abs: 1.5 10*3/uL (ref 0.7–4.0)
MCH: 29.9 pg (ref 26.0–34.0)
MCHC: 33.2 g/dL (ref 30.0–36.0)
MCV: 90 fL (ref 78.0–100.0)
Monocytes Absolute: 0.6 10*3/uL (ref 0.1–1.0)
Monocytes Relative: 10 % (ref 3–12)
Neutro Abs: 3.4 10*3/uL (ref 1.7–7.7)
Neutrophils Relative %: 60 % (ref 43–77)
Platelets: 216 10*3/uL (ref 150–400)
RBC: 4.69 MIL/uL (ref 4.22–5.81)
RDW: 13.8 % (ref 11.5–15.5)
WBC: 5.6 10*3/uL (ref 4.0–10.5)

## 2015-01-22 LAB — COMPREHENSIVE METABOLIC PANEL
ALT: 14 U/L — ABNORMAL LOW (ref 17–63)
AST: 21 U/L (ref 15–41)
Albumin: 4.2 g/dL (ref 3.5–5.0)
Alkaline Phosphatase: 54 U/L (ref 38–126)
Anion gap: 8 (ref 5–15)
BUN: 13 mg/dL (ref 6–20)
CO2: 28 mmol/L (ref 22–32)
Calcium: 9.3 mg/dL (ref 8.9–10.3)
Chloride: 100 mmol/L — ABNORMAL LOW (ref 101–111)
Creatinine, Ser: 1.12 mg/dL (ref 0.61–1.24)
GFR calc Af Amer: >60 mL/min (ref >60)
GFR calc non Af Amer: >60 mL/min (ref >60)
Glucose, Bld: 197 mg/dL — ABNORMAL HIGH (ref 65–99)
Potassium: 4.6 mmol/L (ref 3.5–5.1)
Sodium: 136 mmol/L (ref 135–145)
Total Bilirubin: 0.6 mg/dL (ref 0.3–1.2)
Total Protein: 6.9 g/dL (ref 6.5–8.1)

## 2015-01-22 LAB — URINE MICROSCOPIC-ADD ON

## 2015-01-22 LAB — CBG MONITORING, ED
Glucose-Capillary: 180 mg/dL — ABNORMAL HIGH (ref 65–99)
Glucose-Capillary: 276 mg/dL — ABNORMAL HIGH (ref 65–99)

## 2015-01-22 LAB — ETHANOL: Alcohol, Ethyl (B): <5 mg/dL (ref <5)

## 2015-01-22 MED ORDER — DONEPEZIL HCL 5 MG PO TABS: 5.0000 mg | ORAL_TABLET | Freq: Every day | ORAL | Status: DC

## 2015-01-22 MED ORDER — ASPIRIN EC 81 MG PO TBEC: 81.0000 mg | DELAYED_RELEASE_TABLET | Freq: Every day | ORAL | Status: DC

## 2015-01-22 NOTE — ED Notes (Signed)
Meal given

## 2015-01-22 NOTE — ED Notes (Signed)
Nurse Dorathy DaftKayla called from [facility] to check on pt. Nurse given update on pt's status and also requested MAR to be faxed to ED for updating pt's medication list.

## 2015-01-22 NOTE — ED Notes (Signed)
Patient changed into paper scrubs. 1 pair of tennis shoes, 1 pair of jeans, 1 leather belt, 1 t-shirt in belongings bag, labeled and secured in ED locker. Patient's dentures left at bedside along with his glasses.

## 2015-01-22 NOTE — ED Notes (Signed)
Report given, pt given meal tray. Waiting for EMS transport back to facility.

## 2015-01-22 NOTE — ED Notes (Signed)
Pt advised we are waiting on EMS for a [facility]. Pr returned to room, Sitter still watching pt for safety.

## 2015-01-22 NOTE — ED Notes (Signed)
TTS in progress 

## 2015-01-22 NOTE — ED Notes (Signed)
[facility] [place] [facility]. Patient walked out of [name] to go to house in [place]. Per sheriff, social worker stated that patient  is ward of state. Patient was combative to staff at [facility] per staffs report but not witnessed by sheriff. Patient calm and cooperative at this time. Patient does have hx of dementia but per [facility] staff has not acted like this before, concerned patient has UTI. Patient is diabetic and refuses to take his insulin causing risk to self per Child psychotherapistsocial worker.

## 2015-01-22 NOTE — ED Provider Notes (Signed)
CSN: [id number]     Arrival date & time 01/22/15  7829 History   First MD Initiated Contact with Patient 01/22/15 0840     Chief Complaint  Patient presents with  . V70.1     (Consider location/radiation/quality/duration/timing/severity/associated sxs/prior Treatment) The history is provided by the patient, the nursing home and medical records.    Joseph Bowen is a 69 y.o. male, living in a skilled care facility and with a history of type 2 diabetes, dementia associated with past history of alcoholism and subsequent Wernicke-Korsakoff syndrome, anxiety and depression, PTSD presenting with increased agitation.  Patient was combative with the staff at [facility] per nursing staff and attempted to leave the facility, in attempt to walk to his home.  He denies symptoms at this time. His social worker was contacted, per the officer that accompanies this patient who states she suspects he may have a UTI or other infection causing this behavioral change as he is generally not combative with his baseline dementia.    Past Medical History  Diagnosis Date  . Anxiety   . Depression   . Type 2 diabetes mellitus   . Glaucoma   . Dementia   . COPD (chronic obstructive pulmonary disease)   . Anemia   . Hypoglycemia   . Vitamin B deficiency   . Benign prostatic hypertrophy   . Vitamin D deficiency   . Post traumatic stress disorder   . Chronic pancreatitis   . Essential hypertension   . Retinopathy   . Euthyroid sick syndrome   . Personal history of alcoholism   . Psychosis    Past Surgical History  Procedure Laterality Date  . Hand surgery    . Back surgery    . Hip surgery    . Mohs surgery      Melanoma   Family History  Problem Relation Age of Onset  . Heart disease    . Diabetes     History  Substance Use Topics  . Smoking status: Former Smoker -- 2.00 packs/day for 48 years    Types: Cigarettes    Start date: 09/17/1961    Quit date: 09/17/2009  . Smokeless tobacco:  Never Used  . Alcohol Use: No     Comment: hx of alcoholism    Review of Systems  Unable to perform ROS: Dementia   He denies any physical symptoms today.   Allergies  Review of patient's allergies indicates no known allergies.  Home Medications   Prior to Admission medications   Medication Sig Start Date End Date Taking? Authorizing Provider  aspirin EC 81 MG tablet Take 81 mg by mouth daily.   Yes Historical Provider, MD  donepezil (ARICEPT) 5 MG tablet Take 1 tablet daily 10/26/14  Yes Van Clines, MD  insulin glargine (LANTUS) 100 UNIT/ML injection Inject 29 Units into the skin daily.    Yes Historical Provider, MD  insulin lispro (HUMALOG) 100 UNIT/ML injection Inject 3 Units into the skin 3 (three) times daily with meals. If blood sugar is >200.   Yes Historical Provider, MD  levothyroxine (SYNTHROID, LEVOTHROID) 88 MCG tablet Take 88 mcg by mouth daily before breakfast.   Yes Historical Provider, MD  lipase/protease/amylase (CREON-10/PANCREASE) 12000 UNITS CPEP Take 3 capsules by mouth 3 (three) times daily with meals. **Take 30 minutes before meals**   Yes Historical Provider, MD  LORazepam (ATIVAN) 0.5 MG tablet Take one tablet by mouth three times daily; Take one tablet by mouth twice daily as needed  for breakthrough anxiety 05/03/13  Yes Sharon SellerJessica K Eubanks, NP  Melatonin 3 MG TABS Take 3 mg by mouth at bedtime.   Yes Historical Provider, MD  metFORMIN (GLUCOPHAGE) 1000 MG tablet Take 1,000 mg by mouth 2 (two) times daily with a meal.   Yes Historical Provider, MD  OLANZapine (ZYPREXA) 10 MG tablet Take 10 mg by mouth at bedtime.   Yes Historical Provider, MD  prazosin (MINIPRESS) 2 MG capsule Take 2 mg by mouth at bedtime.   Yes Historical Provider, MD  Propylene Glycol (SYSTANE BALANCE) 0.6 % SOLN Place 1 drop into both eyes at bedtime.   Yes Historical Provider, MD  sennosides-docusate sodium (SENOKOT-S) 8.6-50 MG tablet Take 1 tablet by mouth 2 (two) times daily.   Yes  Historical Provider, MD  sitaGLIPtin (JANUVIA) 50 MG tablet Take 50 mg by mouth daily.   Yes Historical Provider, MD  thiamine 100 MG tablet Take 100 mg by mouth daily.   Yes Historical Provider, MD  venlafaxine (EFFEXOR) 75 MG tablet Take 225 mg by mouth daily.   Yes Historical Provider, MD  Vitamin D, Ergocalciferol, (DRISDOL) 50000 UNITS CAPS capsule Take 50,000 Units by mouth every 30 (thirty) days.   Yes Historical Provider, MD   BP 118/78 mmHg  Pulse 109  Temp(Src) 98 F (36.7 C) (Oral)  Resp 16  Ht 5\' 8"  (1.727 m)  Wt 155 lb (70.308 kg)  BMI 23.57 kg/m2  SpO2 98% Physical Exam  Constitutional: He appears well-developed and well-nourished.  Patient is somewhat disheveled.  He has dried mud splatter on his pant legs.   HENT:  Head: Normocephalic and atraumatic.  Eyes: Conjunctivae and EOM are normal. Pupils are equal, round, and reactive to light.  Neck: Normal range of motion. Neck supple.  Cardiovascular: Regular rhythm, normal heart sounds and intact distal pulses.   No murmur heard. Tachycardia  Pulmonary/Chest: Effort normal and breath sounds normal. He has no wheezes. He has no rales.  Abdominal: Soft. Bowel sounds are normal. He exhibits no distension. There is no tenderness. There is no rebound and no guarding.  Musculoskeletal: Normal range of motion. He exhibits no tenderness.  Neurological: He is alert. He has normal strength. No cranial nerve deficit. Coordination and gait normal.  Skin: Skin is warm and dry.  Psychiatric: He has a normal mood and affect. His behavior is normal. His speech is not rapid and/or pressured. He expresses no homicidal and no suicidal ideation. He exhibits abnormal recent memory.  Patient is cooperative.  Nursing note and vitals reviewed.   ED Course  Procedures (including critical care time) Labs Review Labs Reviewed  URINALYSIS, ROUTINE W REFLEX MICROSCOPIC (NOT AT [facility]) - Abnormal; Notable for the following:    Glucose, UA >1000  (*)    All other components within normal limits  COMPREHENSIVE METABOLIC PANEL - Abnormal; Notable for the following:    Chloride 100 (*)    Glucose, Bld 197 (*)    ALT 14 (*)    All other components within normal limits  URINE RAPID DRUG SCREEN, HOSP PERFORMED - Abnormal; Notable for the following:    Benzodiazepines POSITIVE (*)    All other components within normal limits  URINE MICROSCOPIC-ADD ON - Abnormal; Notable for the following:    Bacteria, UA FEW (*)    Casts HYALINE CASTS (*)    All other components within normal limits  CBG MONITORING, ED - Abnormal; Notable for the following:    Glucose-Capillary 180 (*)  All other components within normal limits  URINE CULTURE  CBC WITH DIFFERENTIAL/PLATELET  ETHANOL  CBG MONITORING, ED    Imaging Review Dg Chest 2 View  01/22/2015   CLINICAL DATA:  Altered mental status today.  EXAM: CHEST  2 VIEW  COMPARISON:  06/08/2014  FINDINGS: The cardiac silhouette, mediastinal and hilar contours are within normal limits and stable. There are chronic lung changes but no acute pulmonary findings. No pleural effusion. The bony thorax is intact.  IMPRESSION: Chronic lung changes but no acute pulmonary findings.   Electronically Signed   By: Rudie Meyer M.D.   On: 01/22/2015 13:26   Ct Head Wo Contrast  01/22/2015   CLINICAL DATA:  Mental status changes this morning. History of dementia.  EXAM: CT HEAD WITHOUT CONTRAST  TECHNIQUE: Contiguous axial images were obtained from the base of the skull through the vertex without intravenous contrast.  COMPARISON:  Multiple previous head CTs.  FINDINGS: Stable age advanced cerebral atrophy, ventriculomegaly and periventricular white matter disease. No extra-axial fluid collections are identified. No CT findings for acute hemispheric infarction or intracranial hemorrhage. No mass lesions. The brainstem and cerebellum are normal.  The bony structures are intact. The paranasal sinuses and mastoid air cells  are clear. The globes are intact.  IMPRESSION: Stable age advanced cerebral atrophy, ventriculomegaly and periventricular white matter disease.  No acute intracranial findings or mass lesion.   Electronically Signed   By: Rudie Meyer M.D.   On: 01/22/2015 10:57     EKG Interpretation None      MDM   Final diagnoses:  Dementia, with behavioral disturbance    Pt is medically cleared.  Pending psych consult at this time.  I suspect his sx are dementia related, no acute findings on exam or behaviorally at this time.  Pt is cooperative.  Will has psych consult, but no indication at this time for need for psych placement.  Pt was evaluated by TTS.  Pt without acute psychosis, HI or SI.  No indication at this time that patient needs or will benefit from inpatient psych placement.  He will be sent back to the nursing facility.  Advised f/u with pcp this week if caregivers feel pt would benefit from a different level of skilled care.    Burgess Amor, PA-C 01/22/15 1343  Vanetta Mulders, MD 01/22/15 [phone]

## 2015-01-22 NOTE — ED Notes (Signed)
Pt transported back [facility].

## 2015-01-22 NOTE — ED Notes (Signed)
Pt made aware to return if symptoms worsen or if any life threatening symptoms occur.   

## 2015-01-22 NOTE — ED Notes (Signed)
[facility] staff reported that pt would have to be transported by EMS back to facility and understand that this service would be chargeable.

## 2015-01-22 NOTE — BH Assessment (Addendum)
Tele Assessment Note   Joseph Bowen is an 69 y.o. male. Pt arrived to [facility] via police department. The nursing home staff at [facility] stated that the Pt was aggressive today. Pt has dementia (Pt's baseline) and a possible UTI. Pt has been diagnosed with Gwenyth Allegra and diabetes. Pt is a ward of the state. Pt states that he is currently unhappy with his current nursing home. Pt states "I want to go home." Pt denies SA. Pt denies previous or current mental health treatment. Pt denies that he was aggressive toward [employer]. Pt was poor historian. Pt repeatedly stated that he desired to go home.  Collateral information from Katrina-RN at [facility]: Katrina states that the Pt did not assault anyone at [facility] but that Pt was not complying with directions. The Pt was asking to leave in his truck and go home. Katrina states that the Pt asks daily to leave in his truck and go home and he is easily re-directed. Katrina states that the Pt could not be redirected today.   Writer consulted with Renata Caprice, DNP. Per Renata Caprice, DNP Pt is cleared psychiatrically. Recommends Pt returns to nursing home.    Axis I: Dementia Axis II: Deferred Axis III:  Past Medical History  Diagnosis Date  . Anxiety   . Depression   . Type 2 diabetes mellitus   . Glaucoma   . Dementia   . COPD (chronic obstructive pulmonary disease)   . Anemia   . Hypoglycemia   . Vitamin B deficiency   . Benign prostatic hypertrophy   . Vitamin D deficiency   . Post traumatic stress disorder   . Chronic pancreatitis   . Essential hypertension   . Retinopathy   . Euthyroid sick syndrome   . Personal history of alcoholism   . Psychosis    Axis IV: housing problems, other psychosocial or environmental problems and problems with primary support group Axis V: 41-50 serious symptoms  Past Medical History:  Past Medical History  Diagnosis Date  . Anxiety   . Depression   . Type 2 diabetes mellitus   . Glaucoma    . Dementia   . COPD (chronic obstructive pulmonary disease)   . Anemia   . Hypoglycemia   . Vitamin B deficiency   . Benign prostatic hypertrophy   . Vitamin D deficiency   . Post traumatic stress disorder   . Chronic pancreatitis   . Essential hypertension   . Retinopathy   . Euthyroid sick syndrome   . Personal history of alcoholism   . Psychosis     Past Surgical History  Procedure Laterality Date  . Hand surgery    . Back surgery    . Hip surgery    . Mohs surgery      Melanoma    Family History:  Family History  Problem Relation Age of Onset  . Heart disease    . Diabetes      Social History:  reports that he quit smoking about 5 years ago. His smoking use included Cigarettes. He started smoking about 53 years ago. He has a 96 pack-year smoking history. He has never used smokeless tobacco. He reports that he does not drink alcohol or use illicit drugs.  Additional Social History:  Alcohol / Drug Use Pain Medications: Please see medication  Prescriptions: Zyprexa, Effexor Over the Counter: Please see medication  History of alcohol / drug use?: No history of alcohol / drug abuse Longest period of sobriety (  when/how long): NA  CIWA: CIWA-Ar BP: 118/78 mmHg Pulse Rate: 109 COWS:    PATIENT STRENGTHS: (choose at least two) Communication skills Special hobby/interest  Allergies: No Known Allergies  Home Medications:  (Not in a hospital admission)  OB/GYN Status:  No LMP for male patient.  General Assessment Data Location of Assessment: [facility] TTS Assessment: In system Is this a Tele or Face-to-Face Assessment?: Tele Assessment Is this an Initial Assessment or a Re-assessment for this encounter?: Initial Assessment Marital status: Single Maiden name: NA Is patient pregnant?: No Pregnancy Status: No Living Arrangements: Other (Comment) (nursing home) Can pt return to current living arrangement?: Yes Admission Status: Involuntary Is patient capable  of signing voluntary admission?: No Referral Source: Self/Family/Friend Insurance type: [place]     Crisis Care Plan Living Arrangements: Other (Comment) (nursing home) Name of Psychiatrist: NA Name of Therapist: NA  Education Status Is patient currently in school?: No Current Grade: NA Highest grade of school patient has completed: 12 Name of school: NA Contact person: NA  Risk to self with the past 6 months Suicidal Ideation: No Has patient been a risk to self within the past 6 months prior to admission? : No Suicidal Intent: No Has patient had any suicidal intent within the past 6 months prior to admission? : No Is patient at risk for suicide?: No Suicidal Plan?: No Has patient had any suicidal plan within the past 6 months prior to admission? : No Access to Means: No What has been your use of drugs/alcohol within the last 12 months?: NA Previous Attempts/Gestures: No How many times?: 0 Other Self Harm Risks: NA Triggers for Past Attempts: None known Intentional Self Injurious Behavior: None Family Suicide History: No Recent stressful life event(s): Other (Comment) (Does not want to live at nursing home) Persecutory voices/beliefs?: No Depression: No Depression Symptoms:  (Pt denies) Substance abuse history and/or treatment for substance abuse?: No Suicide prevention information given to non-admitted patients: Not applicable  Risk to Others within the past 6 months Homicidal Ideation: No Does patient have any lifetime risk of violence toward others beyond the six months prior to admission? : No Thoughts of Harm to Others: No Current Homicidal Intent: No Current Homicidal Plan: No Access to Homicidal Means: No Identified Victim: NA History of harm to others?: No Assessment of Violence: None Noted Violent Behavior Description: NA Does patient have access to weapons?: No Criminal Charges Pending?: No Does patient have a court date: No Is patient on probation?:  No  Psychosis Hallucinations: None noted Delusions: Unspecified (Dx of dementia)  Mental Status Report Appearance/Hygiene: In hospital gown Eye Contact: Fair Motor Activity: Freedom of movement Speech: Logical/coherent Level of Consciousness: Alert Mood: Irritable Affect: Irritable Anxiety Level: None Thought Processes: Coherent, Relevant Judgement: Impaired Orientation: Person, Place, Situation Obsessive Compulsive Thoughts/Behaviors: None  Cognitive Functioning Concentration: Normal Memory: Recent Intact, Remote Intact IQ: Average Insight: Poor Impulse Control: Poor Appetite: Fair Weight Loss: 0 Weight Gain: 0 Sleep: Decreased Total Hours of Sleep: 5 Vegetative Symptoms: None  ADLScreening [facility]) Patient's cognitive ability adequate to safely complete daily activities?: Yes Patient able to express need for assistance with ADLs?: Yes Independently performs ADLs?: Yes (appropriate for developmental age)  Prior Inpatient Therapy Prior Inpatient Therapy: No Prior Therapy Dates: NA Prior Therapy Facilty/Provider(s): NA Reason for Treatment: NA'  Prior Outpatient Therapy Prior Outpatient Therapy: No Prior Therapy Dates: NA Prior Therapy Facilty/Provider(s): NA Reason for Treatment: NA Does patient have an ACCT team?: No Does patient have  Intensive In-House Services?  : No Does patient have [facility] services? : No Does patient have P4CC services?: No  ADL Screening (condition at time of admission) Patient's cognitive ability adequate to safely complete daily activities?: Yes Is the patient deaf or have difficulty hearing?: No Does the patient have difficulty seeing, even when wearing glasses/contacts?: No Does the patient have difficulty concentrating, remembering, or making decisions?: No Patient able to express need for assistance with ADLs?: Yes Does the patient have difficulty dressing or bathing?: No Independently performs ADLs?: Yes  (appropriate for developmental age) Does the patient have difficulty walking or climbing stairs?: No Weakness of Legs: None Weakness of Arms/Hands: None       Abuse/Neglect Assessment (Assessment to be complete while patient is alone) Physical Abuse: Denies Verbal Abuse: Denies Sexual Abuse: Denies Exploitation of patient/patient's resources: Denies Self-Neglect: Denies Values / Beliefs Cultural Requests During Hospitalization: None Spiritual Requests During Hospitalization: None   Advance Directives (For Healthcare) Does patient have an advance directive?: No Would patient like information on creating an advanced directive?: No - patient declined information    Additional Information 1:1 In Past 12 Months?: No CIRT Risk: No Elopement Risk: No Does patient have medical clearance?: No     Disposition:  Disposition Initial Assessment Completed for this Encounter: Yes Disposition of Patient: Other dispositions Other disposition(s): Other (Comment)  Newt Levingston D 01/22/2015 12:02 PM

## 2015-01-22 NOTE — ED Notes (Signed)
[facility] called & advised pt was being transported back to facility.

## 2015-01-22 NOTE — Discharge Instructions (Signed)

## 2015-01-24 LAB — URINE CULTURE: Special Requests: NORMAL

## 2015-04-21 ENCOUNTER — Other Ambulatory Visit (HOSPITAL_COMMUNITY): Payer: Self-pay | Admitting: Anesthesiology

## 2015-04-21 ENCOUNTER — Ambulatory Visit (INDEPENDENT_AMBULATORY_CARE_PROVIDER_SITE_OTHER): Payer: Medicare Other | Admitting: Neurology

## 2015-04-21 ENCOUNTER — Encounter: Payer: Self-pay | Admitting: Neurology

## 2015-04-21 VITALS — BP 90/60 | HR 71 | Resp 16 | Ht 69.0 in | Wt 150.0 lb

## 2015-04-21 DIAGNOSIS — F1096 Alcohol use, unspecified with alcohol-induced persisting amnestic disorder: Secondary | ICD-10-CM

## 2015-04-21 DIAGNOSIS — M79602 Pain in left arm: Secondary | ICD-10-CM

## 2015-04-21 DIAGNOSIS — R413 Other amnesia: Secondary | ICD-10-CM

## 2015-04-21 DIAGNOSIS — M542 Cervicalgia: Secondary | ICD-10-CM

## 2015-04-21 NOTE — Patient Instructions (Signed)
1. Continue Aricept  daily and daily thiamine 2. Physical exercise and brain stimulation exercises (crossword puzzles, word search, etc) are important for brain health 3. Follow-up in 1 year

## 2015-04-21 NOTE — Progress Notes (Signed)
NEUROLOGY FOLLOW UP OFFICE NOTE  Joseph Bowen [id number]  HISTORY OF PRESENT ILLNESS: I had the pleasure of seeing Joseph Bowen in follow-up in the neurology clinic on 04/21/2015.  The patient was last seen 6 months ago for memory loss and syncope. He is accompanied SNF staff who helps supplement the history today. Records and images were personally reviewed where available.Staff denies any changes. A recent progress note from his SNF was reviewed, no reports of changes in mood/behavior, night terrors. He worked with PT this morning. When asked about his memory, he states "it's bad," reporting forgetting things in the short-term. On his last visit, Joseph Bowen, his legal guardian, reported worsening of memory. MMSE in April 2016 was 25/30. Per SNF note, his MMSE in August was 25/30. He was started on low dose Aricept 5mg  daily that he is tolerating without side effects. He denies any headaches, dizziness, diplopia, focal numbness/tingling/weakness. No falls or further syncopal episodes since adjusting BP medications.  HPI: This is a pleasant 69 yo LH man with a history of hypertension, diabetes, PTSD, who presented for memory loss and syncope. SNF staff reports that they first got involved with Joseph Bowen in December 2011 after he was admitted for hypoglycemia. 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 He went back to his trailer and apparently did not respond to visitors that police broke down his door and found him unresponsive with a blood sugar of 9. He was in a coma for 3 days, and Joseph Bowen reports that his memory issues became more noticeable. Prior to that, he would get lost driving (also had DUI) and had missed utility bills. He has been at [facility] since then, where his health has overall improved. He is not drinking or smoking anymore. His memory has improved some, however Joseph Bowen reports that he "cannot keep his life in  chronological order." He wakes up some days thinking he is 69 years old and would want to go for a drive, or thinks he is in [place] or [place]. 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 He continues to read voraciously and plays chess, but gets frustrated when he realizes he cannot do certain things. He himself reports that his memory is "bad," mainly with short-term memory. He is not aware of family history of memory issues. He is a [place] veteran, denies any head injuries. He has PTSD and night terrors, treated with Prazosin per Joseph Bowen. Joseph Bowen reports exposure to [employer].  Last November 2015, he was in the shower and fell. It was unclear if he lost consciousness, however on 06/12/14, he was sitting down playing chess when he suddenly slumped down. His blood sugar was 130. He was brought to his bed and woke up, then passed out again while lying on the bed. This happened a third time and his BP was noted to be low. Lisinopril was discontinued. He saw Cardiology and Prazosin dose was reduced. No further syncopal episodes since then. No worsening of night terrors.   Diagnostic data: I personally reviewed MRI brain without contrast which showed moderate diffuse volume loss with chronic ventriculomegaly, stable and fairly diminutive appearance of the temporal horns, no transependymal edema suspected. There was moderate chronic microvascular disease.  PAST MEDICAL HISTORY: Past Medical History  Diagnosis Date  . Anxiety   . Depression   . Type 2 diabetes mellitus (HCC)   .  Glaucoma   . Dementia   . COPD (chronic obstructive pulmonary disease) (HCC)   . Anemia   . Hypoglycemia   . Vitamin B deficiency   . Benign prostatic hypertrophy   . Vitamin D deficiency   . Post traumatic stress disorder   . Chronic pancreatitis (HCC)   . Essential hypertension   . Retinopathy   . Euthyroid sick syndrome   . Personal history of alcoholism (HCC)   . Psychosis      MEDICATIONS: Current Outpatient Prescriptions on File Prior to Visit  Medication Sig Dispense Refill  . aspirin EC 81 MG tablet Take 81 mg by mouth daily.    Marland Kitchen donepezil (ARICEPT) 5 MG tablet Take 1 tablet daily 30 tablet 11  . insulin glargine (LANTUS) 100 UNIT/ML injection Inject 27 Units into the skin daily.     . insulin lispro (HUMALOG) 100 UNIT/ML injection Inject 3 Units into the skin 3 (three) times daily with meals. If blood sugar is >200.    Marland Kitchen lipase/protease/amylase (CREON-10/PANCREASE) 12000 UNITS CPEP Take 3 capsules by mouth 3 (three) times daily with meals. **Take 30 minutes before meals**    . LORazepam (ATIVAN) 0.5 MG tablet Take one tablet by mouth three times daily; Take one tablet by mouth twice daily as needed for breakthrough anxiety 150 tablet 5  . Melatonin 3 MG TABS Take 3 mg by mouth at bedtime.    . metFORMIN (GLUCOPHAGE) 1000 MG tablet Take 1,000 mg by mouth 2 (two) times daily with a meal.    . OLANZapine (ZYPREXA) 10 MG tablet Take 10 mg by mouth at bedtime.    . prazosin (MINIPRESS) 2 MG capsule Take 2 mg by mouth at bedtime.    Marland Kitchen Propylene Glycol (SYSTANE BALANCE) 0.6 % SOLN Place 1 drop into both eyes at bedtime.    . sennosides-docusate sodium (SENOKOT-S) 8.6-50 MG tablet Take 1 tablet by mouth 2 (two) times daily.    . sitaGLIPtin (JANUVIA) 50 MG tablet Take 50 mg by mouth daily.    Marland Kitchen thiamine 100 MG tablet Take 100 mg by mouth daily.    Marland Kitchen venlafaxine (EFFEXOR) 75 MG tablet Take 225 mg by mouth daily.    . Vitamin D, Ergocalciferol, (DRISDOL) 50000 UNITS CAPS capsule Take 50,000 Units by mouth every 30 (thirty) days.     No current facility-administered medications on file prior to visit.    ALLERGIES: No Known Allergies  FAMILY HISTORY: Family History  Problem Relation Age of Onset  . Heart disease    . Diabetes      SOCIAL HISTORY: Social History   Social History  . Marital Status: Divorced    Spouse Name: N/A  . Number of Children:  N/A  . Years of Education: N/A   Occupational History  . Not on file.   Social History Main Topics  . Smoking status: Former Smoker -- 2.00 packs/day for 48 years    Types: Cigarettes    Start date: 09/17/1961    Quit date: 09/17/2009  . Smokeless tobacco: Never Used  . Alcohol Use: No     Comment: hx of alcoholism  . Drug Use: No  . Sexual Activity: Not on file   Other Topics Concern  . Not on file   Social History Narrative    REVIEW OF SYSTEMS: Constitutional: No fevers, chills, or sweats, no generalized fatigue, change in appetite Eyes: No visual changes, double vision, eye pain Ear, nose and throat: No hearing loss, ear pain, nasal  congestion, sore throat Cardiovascular: No chest pain, palpitations Respiratory:  No shortness of breath at rest or with exertion, wheezes GastrointestinaI: No nausea, vomiting, diarrhea, abdominal pain, fecal incontinence Genitourinary:  No dysuria, urinary retention or frequency Musculoskeletal:  No neck pain, back pain Integumentary: No rash, pruritus, skin lesions Neurological: as above Psychiatric: No depression, insomnia, anxiety Endocrine: No palpitations, fatigue, diaphoresis, mood swings, change in appetite, change in weight, increased thirst Hematologic/Lymphatic:  No anemia, purpura, petechiae. Allergic/Immunologic: no itchy/runny eyes, nasal congestion, recent allergic reactions, rashes  PHYSICAL EXAM: Filed Vitals:   04/21/15 1059  BP: 90/60  Pulse: 71  Resp: 16   General: No acute distress Head:  Normocephalic/atraumatic Neck: supple, no paraspinal tenderness, full range of motion Heart:  Regular rate and rhythm Lungs:  Clear to auscultation bilaterally Back: No paraspinal tenderness Skin/Extremities: No rash, no edema Neurological Exam: alert and oriented to person, place, and time. No aphasia or dysarthria. Fund of knowledge is appropriate.  Recent and remote memory are intact. 3/3 delayed recall.  Attention and  concentration are normal.    Able to name objects and repeat phrases.  MMSE - Mini Mental State Exam 04/21/2015 10/26/2014  Orientation to time 5 4  Orientation to Place 5 4  Registration 3 3  Attention/ Calculation 5 5  Recall 2 0  Language- name 2 objects 2 2  Language- repeat 1 1  Language- follow 3 step command 3 3  Language- read & follow direction 1 1  Write a sentence 1 1  Copy design 1 1  Total score 29 25   Cranial nerves: Pupils equal, round, reactive to light.  Fundoscopic exam unremarkable, no papilledema. Extraocular movements intact with no nystagmus. Visual fields full. Facial sensation intact. No facial asymmetry. Tongue, uvula, palate midline.  Motor: Bulk and tone normal, muscle strength 5/5 throughout with no pronator drift.  Sensation to light touch, temperature and vibration intact.  No extinction to double simultaneous stimulation.  Deep tendon reflexes 2+ throughout, toes downgoing.  Finger to nose testing intact.  Gait narrow-based and steady, able to tandem walk adequately.  Romberg negative. Again note of bilateral low amplitude high frequency action and postural tremor.  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 MRI brain without contrast showed volume loss with chronic ventriculomegaly. MMSE today 29/30 (previously 25/30 in April and August 2016). Continue Aricept  daily, as well as daily thiamine. No further syncopal episodes since BP medication adjusted. We discussed the importance of physical exercise and brain stimulation exercises for brain health. He will follow-up in 1 year and knows to call our office for any changes.   Thank you for allowing me to participate in his care.  Please do not hesitate to call for any questions or concerns.  The duration of this appointment visit was 24 minutes of  face-to-face time with the patient.  Greater than 50% of this time was spent in counseling, explanation of diagnosis, planning of further management, and coordination of care.   Patrcia Dolly, M.D.   CC: Dr. Virgina Organ

## 2015-04-27 ENCOUNTER — Ambulatory Visit: Payer: Medicare Other | Admitting: Neurology

## 2015-05-11 ENCOUNTER — Ambulatory Visit (HOSPITAL_COMMUNITY): Payer: Medicare Other

## 2015-06-09 ENCOUNTER — Other Ambulatory Visit (HOSPITAL_COMMUNITY): Payer: Medicare Other

## 2015-06-16 ENCOUNTER — Ambulatory Visit (HOSPITAL_COMMUNITY): Payer: Medicare Other

## 2015-07-03 ENCOUNTER — Ambulatory Visit (HOSPITAL_COMMUNITY): Payer: Medicare Other

## 2015-07-27 ENCOUNTER — Other Ambulatory Visit: Payer: Self-pay

## 2015-07-27 ENCOUNTER — Ambulatory Visit (INDEPENDENT_AMBULATORY_CARE_PROVIDER_SITE_OTHER): Payer: Medicare Other | Admitting: Nurse Practitioner

## 2015-07-27 ENCOUNTER — Encounter: Payer: Self-pay | Admitting: Nurse Practitioner

## 2015-07-27 VITALS — BP 100/60 | HR 62 | Temp 98.1°F | Ht 68.0 in | Wt 157.2 lb

## 2015-07-27 DIAGNOSIS — R195 Other fecal abnormalities: Secondary | ICD-10-CM

## 2015-07-27 NOTE — Progress Notes (Signed)
cc'ed to pcp °

## 2015-07-27 NOTE — Progress Notes (Signed)
Primary Care Physician:  Colon BranchQURESHI, AYYAZ, MD Primary Gastroenterologist:  Dr. Darrick PennaFields  Chief Complaint  Patient presents with  . Blood In Stools    HPI:   70 year old male [facility]. He was referred to us by often with practitioner for heme positive stools. Patient seen on 07/05/2015 at which time it was noted that heme stool cards were negative 1 and positive 2. Unsure of when last colonoscopy was as there are no records and our system.  Today his visit is assisted by social worker/guardian and other facility staff. Per the [facility] manag[facility]ardian/social worker, he is intelligent and has all the information in his head, but has issues with memory and recall stemming from an episode of diabetic coma some years ago.   He had hemorrhoid issues in his 4850s and had surgery. Social worker states he's had blood in his stools a few times. Is still continent and unsure how often he has bleeding. Likely last colonoscopy is over 10 years ago, unsure of where or when exactly. Denies abdominal pain. Occasional nausea. Has some dyspepsia symptoms consisting of belching with a bitter taste. Denies melena, vomiting. No dysphagia symptoms. Denies fever, chills, unintentional weight loss. Denies chest pain, dyspnea, dizziness, lightheadedness, syncope, near syncope. Denies any other upper or lower GI symptoms.  Past Medical History  Diagnosis Date  . Anxiety   . Depression   . Type 2 diabetes mellitus (HCC)   . Glaucoma   . Dementia   . COPD (chronic obstructive pulmonary disease) (HCC)   . Anemia   . Hypoglycemia   . Vitamin B deficiency   . Benign prostatic hypertrophy   . Vitamin D deficiency   . Post traumatic stress disorder   . Chronic pancreatitis (HCC)   . Essential hypertension   . Retinopathy   . Euthyroid sick syndrome   . Personal history of alcoholism (HCC)   . Psychosis     Past Surgical History  Procedure Laterality Date  . Hand surgery     . Back surgery    . Hip surgery    . Mohs surgery      Melanoma    Current Outpatient Prescriptions  Medication Sig Dispense Refill  . aspirin EC 81 MG tablet Take 81 mg by mouth daily.    Marland Kitchen. Dextromethorphan-Quinidine (NUEDEXTA) 20-10 MG CAPS Take by mouth daily.    Marland Kitchen. donepezil (ARICEPT) 5 MG tablet Take 1 tablet daily 30 tablet 11  . gabapentin (NEURONTIN) 300 MG capsule Take 300 mg by mouth 3 (three) times daily.    . insulin glargine (LANTUS) 100 UNIT/ML injection Inject 27 Units into the skin daily.     . insulin lispro (HUMALOG) 100 UNIT/ML injection Inject 3 Units into the skin 3 (three) times daily with meals. If blood sugar is >200.    Marland Kitchen. levothyroxine (SYNTHROID, LEVOTHROID) 75 MCG tablet Take 75 mcg by mouth daily before breakfast.    . lipase/protease/amylase (CREON-10/PANCREASE) 12000 UNITS CPEP Take 3 capsules by mouth 3 (three) times daily with meals. **Take 30 minutes before meals**    . LORazepam (ATIVAN) 0.5 MG tablet Take one tablet by mouth three times daily; Take one tablet by mouth twice daily as needed for breakthrough anxiety 150 tablet 5  . Melatonin 3 MG TABS Take 3 mg by mouth at bedtime.    . metFORMIN (GLUCOPHAGE) 1000 MG tablet Take 1,000 mg by mouth 2 (two) times daily with a meal.    .  OLANZapine (ZYPREXA) 10 MG tablet Take 10 mg by mouth at bedtime.    . Oxycodone HCl 10 MG TABS Take 10 mg by mouth. Take 1 tablet every 6 hours as needed for pain    . prazosin (MINIPRESS) 2 MG capsule Take 2 mg by mouth at bedtime.    Marland Kitchen Propylene Glycol (SYSTANE BALANCE) 0.6 % SOLN Place 1 drop into both eyes at bedtime.    . sennosides-docusate sodium (SENOKOT-S) 8.6-50 MG tablet Take 1 tablet by mouth 2 (two) times daily.    . sitaGLIPtin (JANUVIA) 50 MG tablet Take 50 mg by mouth daily.    Marland Kitchen thiamine 100 MG tablet Take 100 mg by mouth daily.    Marland Kitchen venlafaxine (EFFEXOR) 75 MG tablet Take 225 mg by mouth daily.    . Vitamin D, Ergocalciferol, (DRISDOL) 50000 UNITS CAPS  capsule Take 50,000 Units by mouth every 30 (thirty) days.     No current facility-administered medications for this visit.    Allergies as of 07/27/2015  . (No Known Allergies)    Family History  Problem Relation Age of Onset  . Heart disease    . Diabetes      Social History   Social History  . Marital Status: Divorced    Spouse Name: N/A  . Number of Children: N/A  . Years of Education: N/A   Occupational History  . Not on file.   Social History Main Topics  . Smoking status: Former Smoker -- 2.00 packs/day for 48 years    Types: Cigarettes    Start date: 09/17/1961    Quit date: 09/17/2009  . Smokeless tobacco: Never Used  . Alcohol Use: No     Comment: hx of alcoholism  . Drug Use: No  . Sexual Activity: Not on file   Other Topics Concern  . Not on file   Social History Narrative    Review of Systems: 10-point ROS negative except as per HPI.    Physical Exam: BP 100/60 mmHg  Pulse 62  Temp(Src) 98.1 F (36.7 C) (Oral)  Ht 5\' 8"  (1.727 m)  Wt 157 lb 3.2 oz (71.305 kg)  BMI 23.91 kg/m2 General:   Alert and oriented. Pleasant and cooperative. Well-nourished and well-developed.  Head:  Normocephalic and atraumatic. Eyes:  Without icterus, sclera clear and conjunctiva pink.  Ears:  Normal auditory acuity. Cardiovascular:  S1, S2 present without murmurs appreciated. Extremities without clubbing or edema. Respiratory:  Clear to auscultation bilaterally. No wheezes, rales, or rhonchi. No distress.  Gastrointestinal:  +BS, soft, non-tender and non-distended. No HSM noted. No guarding or rebound. No masses appreciated.  Rectal:  Deferred  Neurologic:  Alert and oriented x4;  Patient with memory and recall issues otherwise grossly normal neurologically. Psych:  Alert and cooperative. Normal mood and affect. Heme/Lymph/Immune: No excessive bruising noted.    07/27/2015 3:20 PM

## 2015-07-27 NOTE — Assessment & Plan Note (Signed)
Patient with recurrnet rectal bleeding. Most recently on 2 stool cards ordered by facility NP. They are unsure of the exact number of times he has had rectal bleeding as he is generally independent with his ADLs and especially toileting. He has had a colonoscopy before, but unsure of exactly when. Social worker states it was "definitely over 10 years ago." Otherwise he is asymptomatic from a GI standpoint. Will proceed with diagnostic colonoscopy for further evaluation.  Proceed with colonoscopy in the OR on propofol/MAC with Dr. Darrick PennaFields in the near future. The risks, benefits, and alternatives have been discussed in detail with the patient. They state understanding and desire to proceed.    The patient is currently on Effexor, oxycodone, Ativan, and Neurontin. Due to polypharmacy we'll plan for the procedure and the OR on propofol/MAC to promote adequate sedation.  Of note he is a brittle diabetic. Advised facility to closely monitor his CBCs while he is on clear liquid diet today before. Take half dose diabetes medications night before and none the morning of. Taking used clear liquids as needed for any decrease in his blood sugar. Also requested that when the procedure scheduled to ensure that endoscopy can check his blood sugar befor the procedure.

## 2015-07-27 NOTE — Patient Instructions (Signed)
1. We will schedule your colonoscopy for you. 2. Further recommendations to be based on the results of your colonoscopy. 3. The day before your procedure you'll need to drink clear liquids. The facility should closely monitor your blood sugar that day and use clear liquids for sugar intake as needed based on your blood sugar. 4. The night before your procedure, take half a diabetes medications. The morning of your procedure take none of your diabetes medicines. 5. Return for follow-up as needed based on the results of your procedure.

## 2015-08-09 NOTE — Patient Instructions (Signed)
Joseph Bowen  08/09/2015     @   Your procedure is scheduled on 08/15/2015.  Report to [facility] at 10:45 A.M.  Call this number if you have problems the morning of surgery:  [phone]   Remember:  Do not eat food or drink liquids after midnight.  Take these medicines the morning of surgery with A SIP OF WATER Nudexta, Aricept, Gabapentin, Synthroid, Ativan, Zyprexa, Minipress, Effexor   Do not wear jewelry, make-up or nail polish.  Do not wear lotions, powders, or perfumes.  You may wear deodorant.  Do not shave 48 hours prior to surgery.  Men may shave face and neck.  Do not bring valuables to the hospital.  [facility] is not responsible for any belongings or valuables.  Contacts, dentures or bridgework may not be worn into surgery.  Leave your suitcase in the car.  After surgery it may be brought to your room.  For patients admitted to the hospital, discharge time will be determined by your treatment team.  Patients discharged the day of surgery will not be allowed to drive home.    Please read over the following fact sheets that you were given. Anesthesia Post-op Instructions     PATIENT INSTRUCTIONS POST-ANESTHESIA  IMMEDIATELY FOLLOWING SURGERY:  Do not drive or operate machinery for the first twenty four hours after surgery.  Do not make any important decisions for twenty four hours after surgery or while taking narcotic pain medications or sedatives.  If you develop intractable nausea and vomiting or a severe headache please notify your doctor immediately.  FOLLOW-UP:  Please make an appointment with your surgeon as instructed. You do not need to follow up with anesthesia unless specifically instructed to do so.  WOUND CARE INSTRUCTIONS (if applicable):  Keep a dry clean dressing on the anesthesia/puncture wound site if there is drainage.  Once the wound has quit draining you may leave it open to air.  Generally you should leave the bandage  intact for twenty four hours unless there is drainage.  If the epidural site drains for more than 36-48 hours please call the anesthesia department.  QUESTIONS?:  Please feel free to call your physician or the hospital operator if you have any questions, and they will be happy to assist you.      Colonoscopy A colonoscopy is an exam to look at the entire large intestine (colon). This exam can help find problems such as tumors, polyps, inflammation, and areas of bleeding. The exam takes about 1 hour.  LET [facility] CARE PROVIDER KNOW ABOUT:   Any allergies you have.  All medicines you are taking, including vitamins, herbs, eye drops, creams, and over-the-counter medicines.  Previous problems you or members of your family have had with the use of anesthetics.  Any blood disorders you have.  Previous surgeries you have had.  Medical conditions you have. RISKS AND COMPLICATIONS  Generally, this is a safe procedure. However, as with any procedure, complications can occur. Possible complications include:  Bleeding.  Tearing or rupture of the colon wall.  Reaction to medicines given during the exam.  Infection (rare). BEFORE THE PROCEDURE   Ask your health care provider about changing or stopping your regular medicines.  You may be prescribed an oral bowel prep. This involves drinking a large amount of medicated liquid, starting the day before your procedure. The liquid will cause you to have multiple loose stools until your stool is almost clear or light green. This cleans  out your colon in preparation for the procedure.  Do not eat or drink anything else once you have started the bowel prep, unless your health care provider tells you it is safe to do so.  Arrange for someone to drive you home after the procedure. PROCEDURE   You will be given medicine to help you relax (sedative).  You will lie on your side with your knees bent.  A long, flexible tube with a light and camera  on the end (colonoscope) will be inserted through the rectum and into the colon. The camera sends video back to a computer screen as it moves through the colon. The colonoscope also releases carbon dioxide gas to inflate the colon. This helps your health care provider see the area better.  During the exam, your health care provider may take a small tissue sample (biopsy) to be examined under a microscope if any abnormalities are found.  The exam is finished when the entire colon has been viewed. AFTER THE PROCEDURE   Do not drive for 24 hours after the exam.  You may have a small amount of blood in your stool.  You may pass moderate amounts of gas and have mild abdominal cramping or bloating. This is caused by the gas used to inflate your colon during the exam.  Ask when your test results will be ready and how you will get your results. Make sure you get your test results.   This information is not intended to replace advice given to you by your health care provider. Make sure you discuss any questions you have with your health care provider.   Document Released: 06/28/2000 Document Revised: 04/21/2013 Document Reviewed: 03/08/2013 Elsevier Interactive Patient Education [employer]

## 2015-08-10 ENCOUNTER — Other Ambulatory Visit: Payer: Self-pay

## 2015-08-10 ENCOUNTER — Encounter (HOSPITAL_COMMUNITY): Payer: Self-pay

## 2015-08-10 ENCOUNTER — Encounter (HOSPITAL_COMMUNITY)
Admission: RE | Admit: 2015-08-10 | Discharge: 2015-08-10 | Disposition: A | Discharge status: Home / Self Care | Payer: Medicare Other | Source: Ambulatory Visit | Attending: Gastroenterology | Admitting: Gastroenterology

## 2015-08-10 DIAGNOSIS — E118 Type 2 diabetes mellitus with unspecified complications: Secondary | ICD-10-CM | POA: Insufficient documentation

## 2015-08-10 DIAGNOSIS — Z7982 Long term (current) use of aspirin: Secondary | ICD-10-CM | POA: Insufficient documentation

## 2015-08-10 DIAGNOSIS — K921 Melena: Secondary | ICD-10-CM | POA: Diagnosis not present

## 2015-08-10 DIAGNOSIS — E162 Hypoglycemia, unspecified: Secondary | ICD-10-CM | POA: Insufficient documentation

## 2015-08-10 DIAGNOSIS — Z79899 Other long term (current) drug therapy: Secondary | ICD-10-CM | POA: Insufficient documentation

## 2015-08-10 DIAGNOSIS — J449 Chronic obstructive pulmonary disease, unspecified: Secondary | ICD-10-CM | POA: Diagnosis not present

## 2015-08-10 DIAGNOSIS — Z01810 Encounter for preprocedural cardiovascular examination: Secondary | ICD-10-CM | POA: Diagnosis not present

## 2015-08-10 DIAGNOSIS — Z01812 Encounter for preprocedural laboratory examination: Secondary | ICD-10-CM | POA: Insufficient documentation

## 2015-08-10 HISTORY — DX: Other encephalopathy: G93.49

## 2015-08-10 LAB — BASIC METABOLIC PANEL
Anion gap: 12 (ref 5–15)
BUN: 15 mg/dL (ref 6–20)
CO2: 22 mmol/L (ref 22–32)
Calcium: 8.9 mg/dL (ref 8.9–10.3)
Chloride: 101 mmol/L (ref 101–111)
Creatinine, Ser: 0.92 mg/dL (ref 0.61–1.24)
GFR calc Af Amer: >60 mL/min (ref >60)
GFR calc non Af Amer: >60 mL/min (ref >60)
Glucose, Bld: 264 mg/dL — ABNORMAL HIGH (ref 65–99)
Potassium: 4.8 mmol/L (ref 3.5–5.1)
Sodium: 135 mmol/L (ref 135–145)

## 2015-08-10 LAB — CBC
HCT: 43.2 % (ref 39.0–52.0)
Hemoglobin: 13.8 g/dL (ref 13.0–17.0)
MCH: 29.2 pg (ref 26.0–34.0)
MCHC: 31.9 g/dL (ref 30.0–36.0)
MCV: 91.5 fL (ref 78.0–100.0)
Platelets: 207 10*3/uL (ref 150–400)
RBC: 4.72 MIL/uL (ref 4.22–5.81)
RDW: 14.1 % (ref 11.5–15.5)
WBC: 5.3 10*3/uL (ref 4.0–10.5)

## 2015-08-15 ENCOUNTER — Ambulatory Visit (HOSPITAL_COMMUNITY): Payer: Medicare Other | Admitting: Anesthesiology

## 2015-08-15 ENCOUNTER — Ambulatory Visit (HOSPITAL_COMMUNITY)
Admission: RE | Admit: 2015-08-15 | Discharge: 2015-08-15 | Disposition: A | Discharge status: Nursing Home (Includes ICF) | Payer: Medicare Other | Source: Ambulatory Visit | Attending: Gastroenterology | Admitting: Gastroenterology

## 2015-08-15 ENCOUNTER — Encounter (HOSPITAL_COMMUNITY)
Admission: RE | Disposition: A | Discharge status: Nursing Home (Includes ICF) | Payer: Self-pay | Source: Ambulatory Visit | Attending: Gastroenterology

## 2015-08-15 DIAGNOSIS — K5710 Diverticulosis of small intestine without perforation or abscess without bleeding: Secondary | ICD-10-CM | POA: Diagnosis not present

## 2015-08-15 DIAGNOSIS — F419 Anxiety disorder, unspecified: Secondary | ICD-10-CM | POA: Insufficient documentation

## 2015-08-15 DIAGNOSIS — K5790 Diverticulosis of intestine, part unspecified, without perforation or abscess without bleeding: Secondary | ICD-10-CM | POA: Diagnosis not present

## 2015-08-15 DIAGNOSIS — K921 Melena: Secondary | ICD-10-CM | POA: Insufficient documentation

## 2015-08-15 DIAGNOSIS — Q438 Other specified congenital malformations of intestine: Secondary | ICD-10-CM | POA: Diagnosis not present

## 2015-08-15 DIAGNOSIS — Z87891 Personal history of nicotine dependence: Secondary | ICD-10-CM | POA: Diagnosis not present

## 2015-08-15 DIAGNOSIS — Z7982 Long term (current) use of aspirin: Secondary | ICD-10-CM | POA: Insufficient documentation

## 2015-08-15 DIAGNOSIS — J449 Chronic obstructive pulmonary disease, unspecified: Secondary | ICD-10-CM | POA: Insufficient documentation

## 2015-08-15 DIAGNOSIS — Z794 Long term (current) use of insulin: Secondary | ICD-10-CM | POA: Insufficient documentation

## 2015-08-15 DIAGNOSIS — E119 Type 2 diabetes mellitus without complications: Secondary | ICD-10-CM | POA: Diagnosis not present

## 2015-08-15 DIAGNOSIS — K648 Other hemorrhoids: Secondary | ICD-10-CM | POA: Insufficient documentation

## 2015-08-15 DIAGNOSIS — F329 Major depressive disorder, single episode, unspecified: Secondary | ICD-10-CM | POA: Insufficient documentation

## 2015-08-15 DIAGNOSIS — I10 Essential (primary) hypertension: Secondary | ICD-10-CM | POA: Insufficient documentation

## 2015-08-15 DIAGNOSIS — E559 Vitamin D deficiency, unspecified: Secondary | ICD-10-CM | POA: Insufficient documentation

## 2015-08-15 DIAGNOSIS — Z79899 Other long term (current) drug therapy: Secondary | ICD-10-CM | POA: Insufficient documentation

## 2015-08-15 HISTORY — PX: COLONOSCOPY WITH PROPOFOL: SHX5780

## 2015-08-15 LAB — GLUCOSE, CAPILLARY
Glucose-Capillary: 98 mg/dL (ref 65–99)
Glucose-Capillary: 164 mg/dL — ABNORMAL HIGH (ref 65–99)
Glucose-Capillary: 105 mg/dL — ABNORMAL HIGH (ref 65–99)

## 2015-08-15 SURGERY — COLONOSCOPY WITH PROPOFOL
Anesthesia: Monitor Anesthesia Care

## 2015-08-15 MED ORDER — PROPOFOL 500 MG/50ML IV EMUL
INTRAVENOUS | Status: DC | PRN
  Administered 2015-08-15: 150 ug/kg/min via INTRAVENOUS
  Administered 2015-08-15: 13:00:00 via INTRAVENOUS

## 2015-08-15 MED ORDER — MINERAL OIL PO OIL
TOPICAL_OIL | ORAL | Status: AC
  Filled 2015-08-15: qty 90

## 2015-08-15 MED ORDER — ONDANSETRON HCL 4 MG/2ML IJ SOLN: 4.0000 mg | Freq: Once | INTRAMUSCULAR | Status: DC | PRN

## 2015-08-15 MED ORDER — LACTATED RINGERS IV SOLN
INTRAVENOUS | Status: DC
  Administered 2015-08-15 (×2): via INTRAVENOUS

## 2015-08-15 MED ORDER — LIDOCAINE HCL (PF) 1 % IJ SOLN
INTRAMUSCULAR | Status: AC
  Filled 2015-08-15: qty 10

## 2015-08-15 MED ORDER — MIDAZOLAM HCL 2 MG/2ML IJ SOLN
1.0000 mg | INTRAMUSCULAR | Status: DC | PRN
Start: 2015-08-15 — End: 2015-08-15
  Administered 2015-08-15: 2 mg via INTRAVENOUS

## 2015-08-15 MED ORDER — DEXTROSE 50 % IV SOLN
INTRAVENOUS | Status: AC
  Filled 2015-08-15: qty 50

## 2015-08-15 MED ORDER — PROPOFOL 10 MG/ML IV BOLUS
INTRAVENOUS | Status: AC
  Filled 2015-08-15: qty 40

## 2015-08-15 MED ORDER — DEXTROSE 50 % IV SOLN
12.5000 g | Freq: Once | INTRAVENOUS | Status: AC
  Administered 2015-08-15: 12.5 g via INTRAVENOUS

## 2015-08-15 MED ORDER — FENTANYL CITRATE (PF) 100 MCG/2ML IJ SOLN: 25.0000 ug | INTRAMUSCULAR | Status: DC | PRN

## 2015-08-15 MED ORDER — MIDAZOLAM HCL 2 MG/2ML IJ SOLN
INTRAMUSCULAR | Status: AC
  Filled 2015-08-15: qty 2

## 2015-08-15 MED ORDER — FENTANYL CITRATE (PF) 100 MCG/2ML IJ SOLN
25.0000 ug | INTRAMUSCULAR | Status: AC
  Administered 2015-08-15 (×2): 25 ug via INTRAVENOUS

## 2015-08-15 MED ORDER — FENTANYL CITRATE (PF) 100 MCG/2ML IJ SOLN
INTRAMUSCULAR | Status: AC
  Filled 2015-08-15: qty 2

## 2015-08-15 MED ORDER — ONDANSETRON HCL 4 MG/2ML IJ SOLN
4.0000 mg | Freq: Once | INTRAMUSCULAR | Status: AC
  Administered 2015-08-15: 4 mg via INTRAVENOUS

## 2015-08-15 MED ORDER — ONDANSETRON HCL 4 MG/2ML IJ SOLN
INTRAMUSCULAR | Status: AC
  Filled 2015-08-15: qty 2

## 2015-08-15 MED ORDER — LIDOCAINE HCL (CARDIAC) 10 MG/ML IV SOLN
INTRAVENOUS | Status: DC | PRN
  Administered 2015-08-15: 50 mg via INTRAVENOUS

## 2015-08-15 NOTE — Anesthesia Postprocedure Evaluation (Signed)
Anesthesia Post Note  Patient: Joseph Bowen  Procedure(s) Performed: Procedure(s) (LRB): COLONOSCOPY WITH PROPOFOL (N/A)  Patient location during evaluation: PACU Anesthesia Type: MAC Level of consciousness: awake and awake and alert Pain management: pain level controlled Vital Signs Assessment: post-procedure vital signs reviewed and stable Respiratory status: spontaneous breathing Cardiovascular status: stable Anesthetic complications: no    Last Vitals:  Filed Vitals:   08/15/15 1205 08/15/15 1315  BP: 146/88 138/88  Pulse:  68  Temp:  36.5 C  Resp: 13 18    Last Pain: There were no vitals filed for this visit.               Minerva Areola

## 2015-08-15 NOTE — Anesthesia Procedure Notes (Signed)
Procedure Name: MAC Date/Time: 08/15/2015 12:05 PM Performed by: Franco Nones Pre-anesthesia Checklist: Patient identified, Emergency Drugs available, Suction available, Timeout performed and Patient being monitored Patient Re-evaluated:Patient Re-evaluated prior to inductionOxygen Delivery Method: Non-rebreather mask

## 2015-08-15 NOTE — Progress Notes (Signed)
REVIEWED-NO ADDITIONAL RECOMMENDATIONS. 

## 2015-08-15 NOTE — Transfer of Care (Signed)
Immediate Anesthesia Transfer of Care Note  Patient: Joseph Bowen  Procedure(s) Performed: Procedure(s) with comments: COLONOSCOPY WITH PROPOFOL (N/A) - 1215pm  Patient Location: PACU  Anesthesia Type:MAC  Level of Consciousness: awake  Airway & Oxygen Therapy: Patient Spontanous Breathing  Post-op Assessment: Report given to RN  Post vital signs: Reviewed and stable  Last Vitals:  Filed Vitals:   08/15/15 1200 08/15/15 1205  BP: 148/86 146/88  Pulse:    Temp:    Resp: 14 13    Complications: No apparent anesthesia complications

## 2015-08-15 NOTE — Op Note (Signed)
[facility] [address] [place], [address]   COLONOSCOPY PROCEDURE REPORT  PATIENT: Joseph Bowen, Joseph Bowen  MR#: [id number] BIRTHDATE: 05-Sep-1945 , 69  yrs. old GENDER: male ENDOSCOPIST: West Bali, MD REFERRED XB:JYNWG Qureshi, M.D. PROCEDURE DATE:  August 22, 2015 PROCEDURE:   Colonoscopy, diagnostic INDICATIONS:hematochezia. MEDICATIONS: Monitored anesthesia care  DESCRIPTION OF PROCEDURE:    Physical exam was performed.  Informed consent was obtained from the patient after explaining the benefits, risks, and alternatives to procedure.  The patient was connected to monitor and placed in left lateral position. Continuous oxygen was provided by nasal cannula and IV medicine administered through an indwelling cannula.  After administration of sedation and rectal exam, the patients rectum was intubated and the EC-[id number] ([id number])  colonoscope was advanced under direct visualization to the cecum.  The scope was removed slowly by carefully examining the color, texture, anatomy, and integrity mucosa on the way out.  The patient was recovered in endoscopy and discharged home in satisfactory condition. Estimated blood loss is zero unless otherwise noted in this procedure report.    COLON FINDINGS: The colon was redundant.  Manual abdominal counter-pressure was used to reach the cecum.  The patient was moved on to their back to reach the cecum, Sigmoid colon would not adequately insufflate, There was moderate diverticulosis noted throughout the entire examined colon with associated colonic narrowing, luminal narrowing, muscular hypertrophy and tortuosity. , and Moderate sized internal hemorrhoids were found.  PREP QUALITY: good.  CECAL W/D TIME: 18       minutes COMPLICATIONS: None  ENDOSCOPIC IMPRESSION: 1.   The LEFT colon IS redundant. ADULT COLONOSCOPE EXCHANGED FOR PEDIATRIC SCOPE. 2.   Sigmoid colon would not adequately insufflate 3.   Moderate diverticulosis  throughout the entire examined colon 4.   Moderate sized internal hemorrhoids  RECOMMENDATIONS: SEE DR.  Lovell Sheehan TO HAVE ANOSCOPY TO COMPLETE EVALUATION FOR RECTAL BLEEDING AND DISCUSS BENEFITS V.  RISKS OF HEMORRHOID SURGERY. FOLLOW A HIGH FIBER DIET. USE PREPARATION H UP TO 4 TIMES A DAY FOR RECTAL BLEEDING, ITCHING, PRESSURE, BURNING, OR PAIN. Next colonoscopy in 10-15 years WITH AN OVERTUBE.   eSigned:  West Bali, MD Aug 22, 2015 8:48 PM    CPT CODES: ICD CODES:  The ICD and CPT codes recommended by this software are interpretations from the data that the clinical staff has captured with the software.  The verification of the translation of this report to the ICD and CPT codes and modifiers is the sole responsibility of the health care institution and practicing physician where this report was generated.  [employer] will not be held responsible for the validity of the ICD and CPT codes included on this report.  AMA assumes no liability for data contained or not contained herein. CPT is a Publishing rights manager of the [employer].

## 2015-08-15 NOTE — H&P (Signed)
Primary Care Physician:  Colon Branch, MD Primary Gastroenterologist:  Dr. Darrick Penna  Pre-Procedure History & Physical: HPI:  Joseph Bowen is a 70 y.o. male here for  BRBPR.  Past Medical History  Diagnosis Date  . Anxiety   . Depression   . Type 2 diabetes mellitus (HCC)   . Glaucoma   . Dementia   . COPD (chronic obstructive pulmonary disease) (HCC)   . Anemia   . Hypoglycemia   . Vitamin B deficiency   . Benign prostatic hypertrophy   . Vitamin D deficiency   . Post traumatic stress disorder   . Chronic pancreatitis (HCC)   . Essential hypertension   . Retinopathy   . Euthyroid sick syndrome   . Personal history of alcoholism (HCC)   . Psychosis   . 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  . Dementia     due to encephalopathy from diabetic coma 2011.    Past Surgical History  Procedure Laterality Date  . Hand surgery Right     middle finger; fractue  . Hip surgery Bilateral     bilateral hip fractures  . Mohs surgery      Melanoma  . Back surgery      removal of melanoma of back.  . Hemorrhoid surgery    . Hernia repair Left     inguinal    Prior to Admission medications   Medication Sig Start Date End Date Taking? Authorizing Provider  aspirin 81 MG chewable tablet Chew 81 mg by mouth daily.   Yes Historical Provider, MD  Dextromethorphan-Quinidine (NUEDEXTA) 20-10 MG CAPS Take 1 capsule by mouth 2 (two) times daily.    Yes Historical Provider, MD  donepezil (ARICEPT) 10 MG tablet Take 10 mg by mouth at bedtime.   Yes Historical Provider, MD  gabapentin (NEURONTIN) 300 MG capsule Take 300 mg by mouth 3 (three) times daily.   Yes Historical Provider, MD  insulin glargine (LANTUS) 100 UNIT/ML injection Inject 20 Units into the skin daily.    Yes Historical Provider, MD  insulin lispro (HUMALOG) 100 UNIT/ML injection Inject 3 Units into the skin 3 (three) times daily with meals. If blood sugar is >200.   Yes Historical Provider, MD  levothyroxine  (SYNTHROID, LEVOTHROID) 75 MCG tablet Take 75 mcg by mouth daily before breakfast.   Yes Historical Provider, MD  lipase/protease/amylase (CREON-10/PANCREASE) 12000 UNITS CPEP Take 3 capsules by mouth 3 (three) times daily with meals. **Take 30 minutes before meals**   Yes Historical Provider, MD  LORazepam (ATIVAN) 0.5 MG tablet Take one tablet by mouth three times daily; Take one tablet by mouth twice daily as needed for breakthrough anxiety Patient taking differently: Take 0.5 mg by mouth 3 (three) times daily.  05/03/13  Yes Sharon Seller, NP  Melatonin 3 MG TABS Take 3 mg by mouth at bedtime.   Yes Historical Provider, MD  metFORMIN (GLUCOPHAGE) 1000 MG tablet Take 1,000 mg by mouth 2 (two) times daily with a meal.   Yes Historical Provider, MD  OLANZapine (ZYPREXA) 10 MG tablet Take 10 mg by mouth at bedtime.   Yes Historical Provider, MD  prazosin (MINIPRESS) 2 MG capsule Take 2 mg by mouth at bedtime.   Yes Historical Provider, MD  Propylene Glycol (SYSTANE BALANCE) 0.6 % SOLN Place 1 drop into both eyes at bedtime.   Yes Historical Provider, MD  sennosides-docusate sodium (SENOKOT-S) 8.6-50 MG tablet Take 1 tablet by mouth 2 (two) times daily.  Yes Historical Provider, MD  sitaGLIPtin (JANUVIA) 50 MG tablet Take 50 mg by mouth daily.   Yes Historical Provider, MD  thiamine 100 MG tablet Take 100 mg by mouth daily.   Yes Historical Provider, MD  venlafaxine (EFFEXOR) 75 MG tablet Take 225 mg by mouth daily.   Yes Historical Provider, MD  Vitamin D, Ergocalciferol, (DRISDOL) 50000 UNITS CAPS capsule Take 50,000 Units by mouth every 30 (thirty) days.   Yes Historical Provider, MD  donepezil (ARICEPT) 5 MG tablet Take 1 tablet daily Patient not taking: Reported on 08/04/2015 10/26/14   Van Clines, MD    Allergies as of 07/27/2015  . (No Known Allergies)    Family History  Problem Relation Age of Onset  . Heart disease    . Diabetes      Social History   Social History  .  Marital Status: Divorced    Spouse Name: N/A  . Number of Children: N/A  . Years of Education: N/A   Occupational History  . Not on file.   Social History Main Topics  . Smoking status: Former Smoker -- 2.00 packs/day for 48 years    Types: Cigarettes    Start date: 09/17/1961    Quit date: 09/17/2009  . Smokeless tobacco: Never Used  . Alcohol Use: No     Comment: hx of alcoholism-last used 2011  . Drug Use: No  . Sexual Activity: No   Other Topics Concern  . Not on file   Social History Narrative    Review of Systems: See HPI, otherwise negative ROS   Physical Exam: BP 144/82 mmHg  Pulse 61  Temp(Src) 98.4 F (36.9 C) (Oral)  Resp 16  SpO2 93% General:   Alert,  pleasant and cooperative in NAD Head:  Normocephalic and atraumatic. Neck:  Supple; Lungs:  Clear throughout to auscultation.    Heart:  Regular rate and rhythm. Abdomen:  Soft, nontender and nondistended. Normal bowel sounds, without guarding, and without rebound.   Neurologic:  Alert and  oriented x4;  grossly normal neurologically.  Impression/Plan:    BRBPR  PLAN: TCS TODAY

## 2015-08-15 NOTE — Discharge Instructions (Signed)
THE RECTAL BLEEDING IS MOST LIKELY DUE TO internal hemorrhoids. HE DID NOT HAVE ANY POLYPS.   YOU SHOULD SEE DR. Lovell Sheehan TO HAVE ANOSCOPY TO COMPLETE YOUR EVALUATION FOR RECTAL BLEEDING AND DISCUSS BENEFITS V. RISKS OF HEMORRHOID SURGERY.  FOLLOW A HIGH FIBER DIET. AVOID ITEMS THAT CAUSE BLOATING. SEE INFO BELOW.  USE PREPARATION H UP TO 4 TIMES A DAY FOR RECTAL BLEEDING, ITCHING, PRESSURE, BURNING, OR PAIN.  Next colonoscopy in 10-15 years.  Colonoscopy Care After Read the instructions outlined below and refer to this sheet in the next week. These discharge instructions provide you with general information on caring for yourself after you leave the hospital. While your treatment has been planned according to the most current medical practices available, unavoidable complications occasionally occur. If you have any problems or questions after discharge, call DR. Endi Lagman, [phone].  ACTIVITY  You may resume your regular activity, but move at a slower pace for the next 24 hours.   Take frequent rest periods for the next 24 hours.   Walking will help get rid of the air and reduce the bloated feeling in your belly (abdomen).   No driving for 24 hours (because of the medicine (anesthesia) used during the test).   You may shower.   Do not sign any important legal documents or operate any machinery for 24 hours (because of the anesthesia used during the test).    NUTRITION  Drink plenty of fluids.   You may resume your normal diet as instructed by your doctor.   Begin with a light meal and progress to your normal diet. Heavy or fried foods are harder to digest and may make you feel sick to your stomach (nauseated).   Avoid alcoholic beverages for 24 hours or as instructed.    MEDICATIONS  You may resume your normal medications.   WHAT YOU CAN EXPECT TODAY  Some feelings of bloating in the abdomen.   Passage of more gas than usual.   Spotting of blood in your stool or  on the toilet paper  .  IF YOU HAD POLYPS REMOVED DURING THE COLONOSCOPY:  Eat a soft diet IF YOU HAVE NAUSEA, BLOATING, ABDOMINAL PAIN, OR VOMITING.    FINDING OUT THE RESULTS OF YOUR TEST Not all test results are available during your visit. DR. Darrick Penna WILL CALL YOU WITHIN 7 DAYS OF YOUR PROCEDUE WITH YOUR RESULTS. Do not assume everything is normal if you have not heard from DR. Norena Bratton IN ONE WEEK, CALL HER OFFICE AT [phone].  SEEK IMMEDIATE MEDICAL ATTENTION AND CALL THE OFFICE: [phone] IF:  You have more than a spotting of blood in your stool.   Your belly is swollen (abdominal distention).   You are nauseated or vomiting.   You have a temperature over 101F.   You have abdominal pain or discomfort that is severe or gets worse throughout the day.

## 2015-08-15 NOTE — Anesthesia Preprocedure Evaluation (Signed)
Anesthesia Evaluation  Patient identified by MRN, date of birth, ID band Patient awake    Reviewed: Allergy & Precautions, NPO status , Patient's Chart, lab work & pertinent test results  Airway Mallampati: I  TM Distance: >3 FB     Dental  (+) Edentulous Upper, Edentulous Lower   Pulmonary COPD, former smoker,    breath sounds clear to auscultation       Cardiovascular hypertension, Pt. on medications  Rhythm:Regular Rate:Normal     Neuro/Psych PSYCHIATRIC DISORDERS (PTSD) Anxiety Depression    GI/Hepatic (+)     substance abuse  alcohol use,   Endo/Other  diabetes, Type 2, Insulin Dependent  Renal/GU      Musculoskeletal   Abdominal   Peds  Hematology   Anesthesia Other Findings   Reproductive/Obstetrics                             Anesthesia Physical Anesthesia Plan  ASA: III  Anesthesia Plan: MAC   Post-op Pain Management:    Induction: Intravenous  Airway Management Planned: Simple Face Mask  Additional Equipment:   Intra-op Plan:   Post-operative Plan:   Informed Consent: I have reviewed the patients History and Physical, chart, labs and discussed the procedure including the risks, benefits and alternatives for the proposed anesthesia with the patient or authorized representative who has indicated his/her understanding and acceptance.     Plan Discussed with:   Anesthesia Plan Comments:         Anesthesia Quick Evaluation

## 2015-08-17 ENCOUNTER — Encounter (HOSPITAL_COMMUNITY): Payer: Self-pay | Admitting: Gastroenterology

## 2015-10-04 ENCOUNTER — Emergency Department (HOSPITAL_COMMUNITY)
Admission: EM | Admit: 2015-10-04 | Discharge: 2015-10-05 | Disposition: A | Discharge status: Home / Self Care | Payer: Medicare Other | Attending: Emergency Medicine | Admitting: Emergency Medicine

## 2015-10-04 ENCOUNTER — Encounter (HOSPITAL_COMMUNITY): Payer: Self-pay

## 2015-10-04 DIAGNOSIS — S0101XA Laceration without foreign body of scalp, initial encounter: Secondary | ICD-10-CM | POA: Insufficient documentation

## 2015-10-04 DIAGNOSIS — Y999 Unspecified external cause status: Secondary | ICD-10-CM | POA: Diagnosis not present

## 2015-10-04 DIAGNOSIS — Z87891 Personal history of nicotine dependence: Secondary | ICD-10-CM | POA: Diagnosis not present

## 2015-10-04 DIAGNOSIS — Y929 Unspecified place or not applicable: Secondary | ICD-10-CM | POA: Diagnosis not present

## 2015-10-04 DIAGNOSIS — Z7984 Long term (current) use of oral hypoglycemic drugs: Secondary | ICD-10-CM | POA: Diagnosis not present

## 2015-10-04 DIAGNOSIS — J449 Chronic obstructive pulmonary disease, unspecified: Secondary | ICD-10-CM | POA: Insufficient documentation

## 2015-10-04 DIAGNOSIS — Y9389 Activity, other specified: Secondary | ICD-10-CM | POA: Insufficient documentation

## 2015-10-04 DIAGNOSIS — I10 Essential (primary) hypertension: Secondary | ICD-10-CM | POA: Diagnosis not present

## 2015-10-04 DIAGNOSIS — S0990XA Unspecified injury of head, initial encounter: Secondary | ICD-10-CM

## 2015-10-04 DIAGNOSIS — Z794 Long term (current) use of insulin: Secondary | ICD-10-CM | POA: Diagnosis not present

## 2015-10-04 DIAGNOSIS — W19XXXA Unspecified fall, initial encounter: Secondary | ICD-10-CM

## 2015-10-04 DIAGNOSIS — W208XXA Other cause of strike by thrown, projected or falling object, initial encounter: Secondary | ICD-10-CM | POA: Diagnosis not present

## 2015-10-04 DIAGNOSIS — F329 Major depressive disorder, single episode, unspecified: Secondary | ICD-10-CM | POA: Insufficient documentation

## 2015-10-04 DIAGNOSIS — E11649 Type 2 diabetes mellitus with hypoglycemia without coma: Secondary | ICD-10-CM | POA: Insufficient documentation

## 2015-10-04 NOTE — ED Provider Notes (Signed)
CSN: [id number]     Arrival date & time 10/04/15  2210 History   First MD Initiated Contact with Patient 10/04/15 2247     Chief Complaint  Patient presents with  . Fall  . Head Injury     (Consider location/radiation/quality/duration/timing/severity/associated sxs/prior Treatment) HPI..... Accidental stumble hitting the countertop while adjusting his TV set. Now with a laceration to his right parietal scalp. No loss of consciousness or neurological deficits. Patient states he is totally normal.  Past Medical History  Diagnosis Date  . Anxiety   . Depression   . Type 2 diabetes mellitus (HCC)   . Glaucoma   . Dementia   . COPD (chronic obstructive pulmonary disease) (HCC)   . Anemia   . Hypoglycemia   . Vitamin B deficiency   . Benign prostatic hypertrophy   . Vitamin D deficiency   . Post traumatic stress disorder   . Chronic pancreatitis (HCC)   . Essential hypertension   . Retinopathy   . Euthyroid sick syndrome   . Personal history of alcoholism (HCC)   . Psychosis   . 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  . Dementia     due to encephalopathy from diabetic coma 2011.   Past Surgical History  Procedure Laterality Date  . Hand surgery Right     middle finger; fractue  . Hip surgery Bilateral     bilateral hip fractures  . Mohs surgery      Melanoma  . Back surgery      removal of melanoma of back.  . Hemorrhoid surgery    . Hernia repair Left     inguinal  . Colonoscopy with propofol N/A 08/15/2015    Procedure: COLONOSCOPY WITH PROPOFOL;  Surgeon: West Bali, MD;  Location: [facility];  Service: Endoscopy;  Laterality: N/A;  1215pm   Family History  Problem Relation Age of Onset  . Heart disease    . Diabetes     Social History  Substance Use Topics  . Smoking status: Former Smoker -- 2.00 packs/day for 48 years    Types: Cigarettes    Start date: 09/17/1961    Quit date: 09/17/2009  . Smokeless tobacco: Never Used  . Alcohol Use:  No     Comment: hx of alcoholism-last used 2011    Review of Systems  All other systems reviewed and are negative.     Allergies  Review of patient's allergies indicates no known allergies.  Home Medications   Prior to Admission medications   Medication Sig Start Date End Date Taking? Authorizing Provider  aspirin 81 MG chewable tablet Chew 81 mg by mouth daily.    Historical Provider, MD  Dextromethorphan-Quinidine (NUEDEXTA) 20-10 MG CAPS Take 1 capsule by mouth 2 (two) times daily.     Historical Provider, MD  donepezil (ARICEPT) 10 MG tablet Take 10 mg by mouth at bedtime.    Historical Provider, MD  donepezil (ARICEPT) 5 MG tablet Take 1 tablet daily Patient not taking: Reported on 08/04/2015 10/26/14   Van Clines, MD  gabapentin (NEURONTIN) 300 MG capsule Take 300 mg by mouth 3 (three) times daily.    Historical Provider, MD  insulin glargine (LANTUS) 100 UNIT/ML injection Inject 20 Units into the skin daily.     Historical Provider, MD  insulin lispro (HUMALOG) 100 UNIT/ML injection Inject 3 Units into the skin 3 (three) times daily with meals. If blood sugar is >200.    Historical  Provider, MD  levothyroxine (SYNTHROID, LEVOTHROID) 75 MCG tablet Take 75 mcg by mouth daily before breakfast.    Historical Provider, MD  lipase/protease/amylase (CREON-10/PANCREASE) 12000 UNITS CPEP Take 3 capsules by mouth 3 (three) times daily with meals. **Take 30 minutes before meals**    Historical Provider, MD  LORazepam (ATIVAN) 0.5 MG tablet Take one tablet by mouth three times daily; Take one tablet by mouth twice daily as needed for breakthrough anxiety Patient taking differently: Take 0.5 mg by mouth 3 (three) times daily.  05/03/13   Sharon SellerJessica K Eubanks, NP  Melatonin 3 MG TABS Take 3 mg by mouth at bedtime.    Historical Provider, MD  metFORMIN (GLUCOPHAGE) 1000 MG tablet Take 1,000 mg by mouth 2 (two) times daily with a meal.    Historical Provider, MD  OLANZapine (ZYPREXA) 10 MG  tablet Take 10 mg by mouth at bedtime.    Historical Provider, MD  prazosin (MINIPRESS) 2 MG capsule Take 2 mg by mouth at bedtime.    Historical Provider, MD  Propylene Glycol (SYSTANE BALANCE) 0.6 % SOLN Place 1 drop into both eyes at bedtime.    Historical Provider, MD  sennosides-docusate sodium (SENOKOT-S) 8.6-50 MG tablet Take 1 tablet by mouth 2 (two) times daily.    Historical Provider, MD  sitaGLIPtin (JANUVIA) 50 MG tablet Take 50 mg by mouth daily.    Historical Provider, MD  thiamine 100 MG tablet Take 100 mg by mouth daily.    Historical Provider, MD  venlafaxine (EFFEXOR) 75 MG tablet Take 225 mg by mouth daily.    Historical Provider, MD  Vitamin D, Ergocalciferol, (DRISDOL) 50000 UNITS CAPS capsule Take 50,000 Units by mouth every 30 (thirty) days.    Historical Provider, MD   BP 145/83 mmHg  Pulse 70  Temp(Src) 97.9 F (36.6 C) (Oral)  Resp 20  Ht 5\' 8"  (1.727 m)  Wt 155 lb (70.308 kg)  BMI 23.57 kg/m2  SpO2 95% Physical Exam  Constitutional: He is oriented to person, place, and time. He appears well-developed and well-nourished.  HENT:  Head: Normocephalic.  2 cm oblique laceration right occipital parietal scalp  Eyes: Conjunctivae and EOM are normal. Pupils are equal, round, and reactive to light.  Neck: Normal range of motion. Neck supple.  Cardiovascular: Normal rate and regular rhythm.   Pulmonary/Chest: Effort normal and breath sounds normal.  Abdominal: Soft. Bowel sounds are normal.  Musculoskeletal: Normal range of motion.  Neurological: He is alert and oriented to person, place, and time.  Skin: Skin is warm and dry.  Psychiatric: He has a normal mood and affect. His behavior is normal.  Nursing note and vitals reviewed.   ED Course  .Marland Kitchen.Laceration Repair Date/Time: 10/04/2015 11:00 PM Performed by: Donnetta HutchingOOK, Anastasya Jewell Authorized by: Donnetta HutchingOOK, Jaanvi Fizer Consent: Verbal consent obtained. Risks and benefits: risks, benefits and alternatives were discussed Consent given  by: patient Comments: 2300:  Wound cleaned with normal saline. No foreign body. This staples 3. Patient tolerated procedure well.   (including critical care time) Labs Review Labs Reviewed - No data to display  Imaging Review No results found. I have personally reviewed and evaluated these images and lab results as part of my medical decision-making.   EKG Interpretation None      MDM   Final diagnoses:  Fall, initial encounter  Minor head injury, initial encounter  Laceration of scalp, initial encounter    Patient is neurologically intact. Scalp laceration stapled as above.    Donnetta HutchingBrian Tyon Cerasoli, MD 10/04/15  2318 

## 2015-10-04 NOTE — Discharge Instructions (Signed)
Keep wound clean and dry. Staples out next Friday.

## 2015-10-04 NOTE — ED Notes (Signed)
Pt is a resident [facility] where he apparently fell off of a countertop while he was trying to adjust his tv.  Unknown if pt had loc due to being in the room by himself.  Pt has a scalp laceration of unknown size due to dressing in place on ems arrival and arrival to e.d.  Pt is awake and alert at this time.

## 2015-10-05 NOTE — ED Notes (Signed)
[facility] at this time. Nurse stated they would not be able to pick patient up and would need to be transferred with EMS.

## 2015-10-20 ENCOUNTER — Encounter: Payer: Self-pay | Admitting: Neurology

## 2015-10-20 ENCOUNTER — Ambulatory Visit (INDEPENDENT_AMBULATORY_CARE_PROVIDER_SITE_OTHER): Payer: Medicare Other | Admitting: Neurology

## 2015-10-20 VITALS — BP 110/70 | HR 78 | Resp 18 | Wt 157.0 lb

## 2015-10-20 DIAGNOSIS — W19XXXA Unspecified fall, initial encounter: Secondary | ICD-10-CM | POA: Diagnosis not present

## 2015-10-20 DIAGNOSIS — S0990XA Unspecified injury of head, initial encounter: Secondary | ICD-10-CM

## 2015-10-20 DIAGNOSIS — F1096 Alcohol use, unspecified with alcohol-induced persisting amnestic disorder: Secondary | ICD-10-CM

## 2015-10-20 NOTE — Progress Notes (Signed)
NEUROLOGY FOLLOW UP OFFICE NOTE  Joseph Bowen Bowen [id number]  HISTORY OF PRESENT ILLNESS: I had the pleasure of seeing Joseph Bowen Bowen in follow-up in the neurology clinic on 10/20/2015. The patient was last seen 6 months ago for memory loss and syncope.He presents for an earlier visit due to recent fall, consult sheet indicates: Neurology visit post-minor head injury with scalp laceration. He is accompanied by 2 group home staff members who are unfamiliar with much of the events. Records from his ER visit were reviewed. Joseph HuaDavid himself recalls the incident, on 10/04/15 he reports that he was trying to change the channel on an elevated TV and stood on a wheelchair to reach up. He slipped and hit his head, sustaining a laceration to the right parietal scalp and required sutures. He denied any loss of consciousness. He denies any headaches, dizziness, vision changes, focal numbness/tingling/weakness. He reports memory is "bad" but states it is unchanged. Group home staff deny any memory changes. He has been taking Aricept 5mg  daily without side effects.   HPI: This is a pleasant 70 yo LH man with a history of hypertension, diabetes, PTSD, who presented for memory loss and syncope. SNF staff reports that they first got involved with [name] in December 2011 after he was admitted for hypoglycemia. He was found to be a brittle diabetic [facility] on hospital discharge. He went back to his trailer and apparently did not respond to visitors that police broke down his door and found him unresponsive with a blood sugar of 9. He was in a coma for 3 days, and Joseph Bowen reports that his memory issues became more noticeable. Prior to that, he would get lost driving (also had DUI) and had missed utility bills. [facility] has [facility] since then, where his health has overall improved. He is not drinking or smoking anymore. His memory has improved some, however Joseph Bowen reports that  he "cannot keep his life in chronological order." He wakes up some days thinking he is 70 years old and would want to go for a drive, or thinks he is in [place] or [place]. 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 He continues to read voraciously and plays chess, but gets frustrated when he realizes he cannot do certain things. He himself reports that his memory is "bad," mainly with short-term memory. He is not aware of family history of memory issues. He is a [place] veteran, denies any head injuries. He has PTSD and night terrors, treated with Prazosin per Joseph Bowen. Joseph Bowen reports exposure to [employer] [place].  Last November 2015, he was in the shower and fell. It was unclear if he lost consciousness, however on 06/12/14, he was sitting down playing chess when he suddenly slumped down. His blood sugar was 130. He was brought to his bed and woke up, then passed out again while lying on the bed. This happened a third time and his BP was noted to be low. Lisinopril was discontinued. He saw Cardiology and Prazosin dose was reduced. No further syncopal episodes since then. No worsening of night terrors.   Diagnostic data: I personally reviewed MRI brain without contrast which showed moderate diffuse volume loss with chronic ventriculomegaly, stable and fairly diminutive appearance of the temporal horns, no transependymal edema suspected. There was moderate chronic microvascular disease.  PAST MEDICAL HISTORY: Past Medical History  Diagnosis Date  . Anxiety   . Depression   .  Type 2 diabetes mellitus (HCC)   . Glaucoma   . Dementia   . COPD (chronic obstructive pulmonary disease) (HCC)   . Anemia   . Hypoglycemia   . Vitamin B deficiency   . Benign prostatic hypertrophy   . Vitamin D deficiency   . Post traumatic stress disorder   . Chronic pancreatitis (HCC)   . Essential hypertension   . Retinopathy   . Euthyroid sick syndrome   . Personal history of  alcoholism (HCC)   . Psychosis   . 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  . Dementia     due to encephalopathy from diabetic coma 2011.    MEDICATIONS: Current Outpatient Prescriptions on File Prior to Visit  Medication Sig Dispense Refill  . aspirin 81 MG chewable tablet Chew 81 mg by mouth daily.    Marland Kitchen Dextromethorphan-Quinidine (NUEDEXTA) 20-10 MG CAPS Take 1 capsule by mouth 2 (two) times daily.     Marland Kitchen donepezil (ARICEPT) 10 MG tablet Take 10 mg by mouth at bedtime.    . gabapentin (NEURONTIN) 300 MG capsule Take 300 mg by mouth 3 (three) times daily.    . insulin glargine (LANTUS) 100 UNIT/ML injection Inject 22 Units into the skin daily.     . insulin lispro (HUMALOG) 100 UNIT/ML injection Inject 3 Units into the skin 3 (three) times daily with meals. If blood sugar is >200.    Marland Kitchen levothyroxine (SYNTHROID, LEVOTHROID) 75 MCG tablet Take 75 mcg by mouth daily before breakfast.    . lipase/protease/amylase (CREON-10/PANCREASE) 12000 UNITS CPEP Take 3 capsules by mouth 3 (three) times daily with meals. **Take 30 minutes before meals**    . LORazepam (ATIVAN) 0.5 MG tablet Take one tablet by mouth three times daily; Take one tablet by mouth twice daily as needed for breakthrough anxiety (Patient taking differently: Take 0.5 mg by mouth 3 (three) times daily. ) 150 tablet 5  . Melatonin 3 MG TABS Take 3 mg by mouth at bedtime.    . metFORMIN (GLUCOPHAGE) 1000 MG tablet Take 1,000 mg by mouth 2 (two) times daily with a meal.    . OLANZapine (ZYPREXA) 10 MG tablet Take 10 mg by mouth at bedtime.    . prazosin (MINIPRESS) 2 MG capsule Take 2 mg by mouth at bedtime.    Marland Kitchen Propylene Glycol (SYSTANE BALANCE) 0.6 % SOLN Place 1 drop into both eyes at bedtime.    . sennosides-docusate sodium (SENOKOT-S) 8.6-50 MG tablet Take 1 tablet by mouth 2 (two) times daily.    . sitaGLIPtin (JANUVIA) 50 MG tablet Take 50 mg by mouth daily.    Marland Kitchen thiamine 100 MG tablet Take 100 mg by mouth daily.    Marland Kitchen  venlafaxine (EFFEXOR) 75 MG tablet Take 225 mg by mouth daily.    . Vitamin D, Ergocalciferol, (DRISDOL) 50000 UNITS CAPS capsule Take 50,000 Units by mouth every 30 (thirty) days.     No current facility-administered medications on file prior to visit.    ALLERGIES: No Known Allergies  FAMILY HISTORY: Family History  Problem Relation Age of Onset  . Heart disease    . Diabetes      SOCIAL HISTORY: Social History   Social History  . Marital Status: Divorced    Spouse Name: N/A  . Number of Children: N/A  . Years of Education: N/A   Occupational History  . Not on file.   Social History Main Topics  . Smoking status: Former Smoker --  2.00 packs/day for 48 years    Types: Cigarettes    Start date: 09/17/1961    Quit date: 09/17/2009  . Smokeless tobacco: Never Used  . Alcohol Use: No     Comment: hx of alcoholism-last used 2011  . Drug Use: No  . Sexual Activity: No   Other Topics Concern  . Not on file   Social History Narrative    REVIEW OF SYSTEMS: Constitutional: No fevers, chills, or sweats, no generalized fatigue, change in appetite Eyes: No visual changes, double vision, eye pain Ear, nose and throat: No hearing loss, ear pain, nasal congestion, sore throat Cardiovascular: No chest pain, palpitations Respiratory:  No shortness of breath at rest or with exertion, wheezes GastrointestinaI: No nausea, vomiting, diarrhea, abdominal pain, fecal incontinence Genitourinary:  No dysuria, urinary retention or frequency Musculoskeletal:  No neck pain, back pain Integumentary: No rash, pruritus, skin lesions Neurological: as above Psychiatric: No depression, insomnia, anxiety Endocrine: No palpitations, fatigue, diaphoresis, mood swings, change in appetite, change in weight, increased thirst Hematologic/Lymphatic:  No anemia, purpura, petechiae. Allergic/Immunologic: no itchy/runny eyes, nasal congestion, recent allergic reactions, rashes  PHYSICAL EXAM: Filed  Vitals:   10/20/15 1048  BP: 110/70  Pulse: 78  Resp: 18   General: No acute distress Head:  Normocephalic/atraumatic Neck: supple, no paraspinal tenderness, full range of motion Heart:  Regular rate and rhythm Lungs:  Clear to auscultation bilaterally Back: No paraspinal tenderness Skin/Extremities: No rash, no edema Neurological Exam: alert and oriented to person, place, states it is March 2017. Initially unable to name president, but chose correctly with multiple choice. No aphasia or dysarthria. Fund of knowledge is appropriate.  Recent and remote memory are impaired. 1/3 delayed recall.  Attention and concentration are normal.    Able to name objects and repeat phrases. Cranial nerves: Pupils equal, round, reactive to light.  Fundoscopic exam unremarkable, no papilledema. Extraocular movements intact with no nystagmus. Visual fields full. Facial sensation intact. No facial asymmetry. Tongue, uvula, palate midline.  Motor: Bulk and tone normal, muscle strength 5/5 throughout with no pronator drift.  Sensation to light touch, temperature and vibration intact.  No extinction to double simultaneous stimulation.  Deep tendon reflexes 2+ throughout, toes downgoing.  Finger to nose testing intact.  Gait narrow-based and steady, able to tandem walk adequately.  Romberg negative. 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 MRI brain without contrast showed volume loss with chronic ventriculomegaly. He presents for an earlier visit for evaluation after minor head injury last 10/04/15. No loss of consciousness, neurological exam non-focal, cognitive status at baseline. Patient was reassured and we discussed fall risk. Continue all medications,  including Aricept and thiamine. No further syncopal episodes since BP medication adjusted. He will follow-up in 1 year and knows to call our office for any changes.   Thank you for allowing me to participate in his care.  Please do not hesitate to call for any questions or concerns.  The duration of this appointment visit was 25 minutes of face-to-face time with the patient.  Greater than 50% of this time was spent in counseling, explanation of diagnosis, planning of further management, and coordination of care.   Patrcia Dolly, M.D.   CC: Dr. Virgina Organ

## 2015-10-20 NOTE — Patient Instructions (Signed)
1. Continue all your medications 2. Follow-up in 1 year, call for any changes

## 2015-12-15 ENCOUNTER — Encounter: Payer: Self-pay | Admitting: Neurology

## 2016-04-22 ENCOUNTER — Ambulatory Visit: Payer: Medicare Other | Admitting: Neurology

## 2016-07-13 IMAGING — CT CT HEAD W/O CM
1 series · 16 of 30 positions shown, 20 images · non-contrast
Comparison: 11/30/2013.

CLINICAL DATA: medical clearance. Psychiatric admission. Initial
encounter.

EXAM:
CT HEAD WITHOUT CONTRAST
TECHNIQUE: Contiguous axial images were obtained from the base of the skull
through the vertex without intravenous contrast.

[Series 2: headseq 4.8 h37s · axial · 0.43mm/px · z∈[+126,+310]mm · 16 of 42 slices shown, 20 images]
[im 2/42  brain]
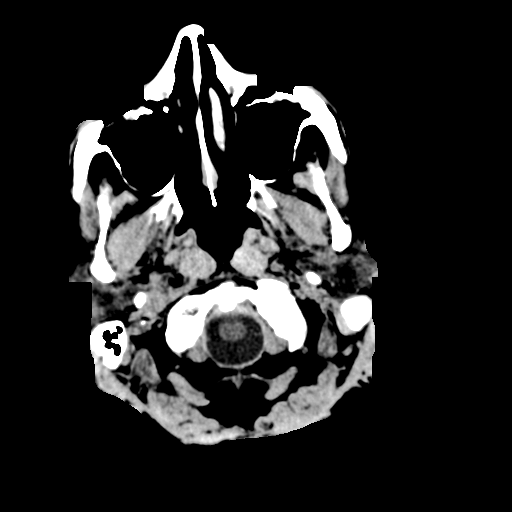
[im 2/42  bone]
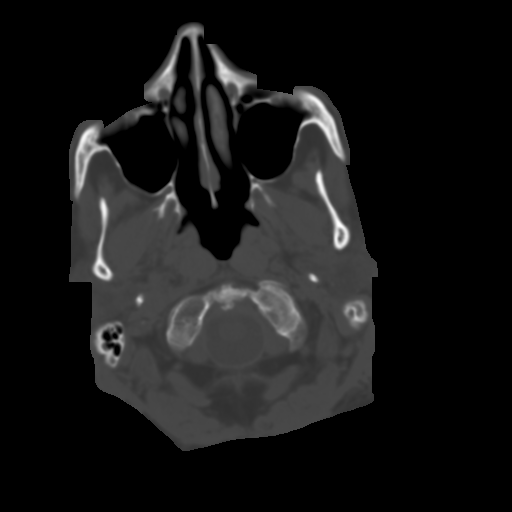
[im 5/42  brain]
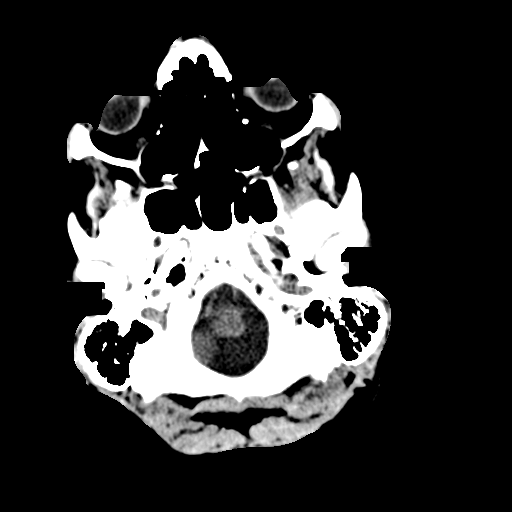
[im 8/42  brain]
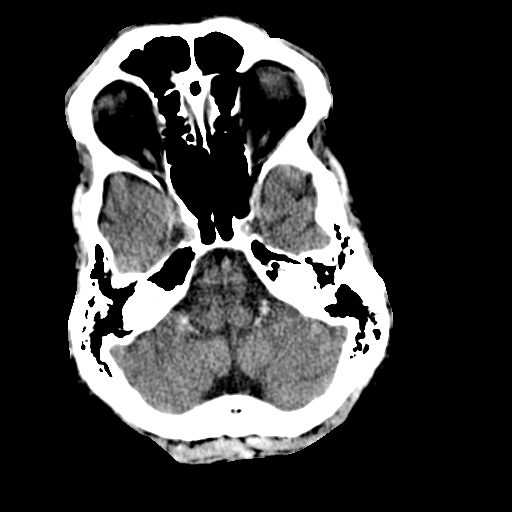
[im 10/42  brain]
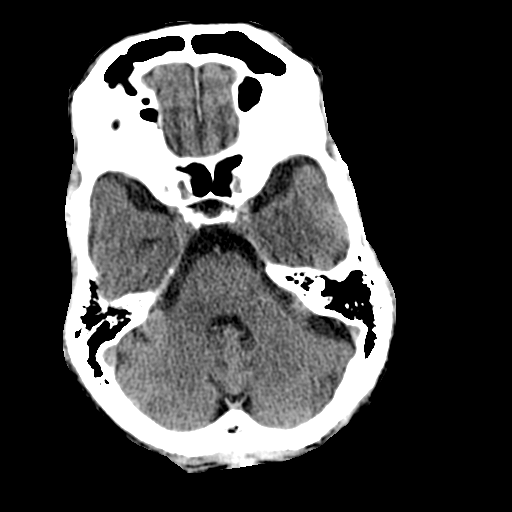
[im 12/42  brain]
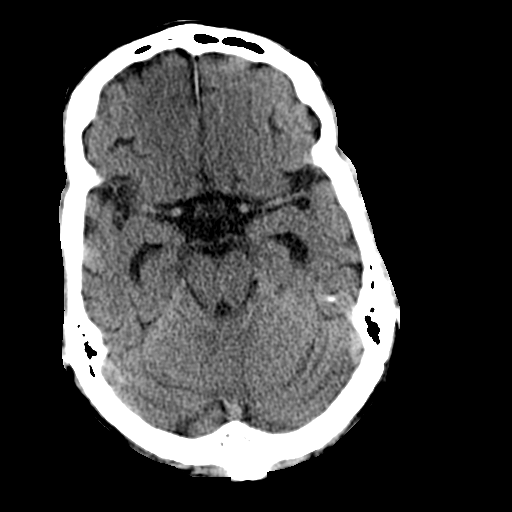
[im 12/42  bone]
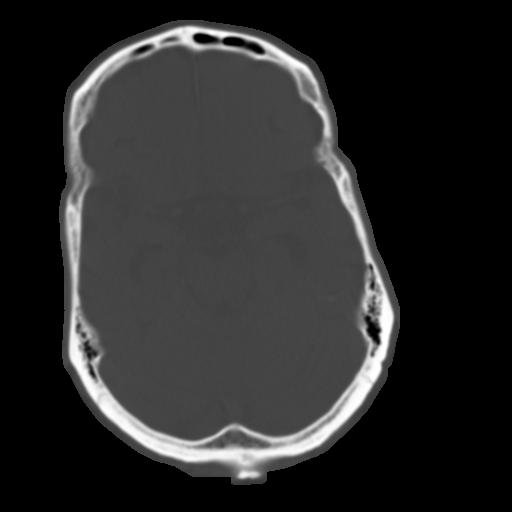
[im 15/42  brain]
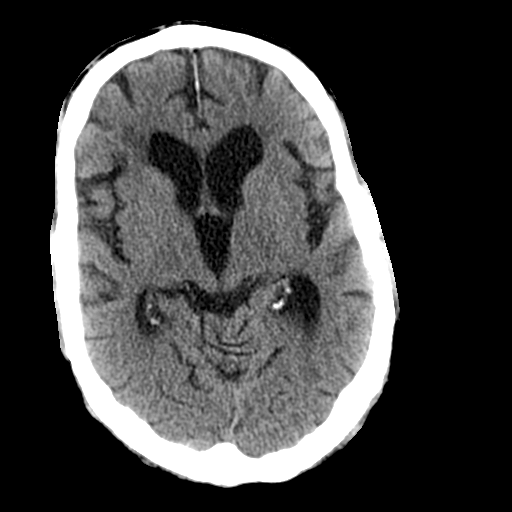
[im 17/42  brain]
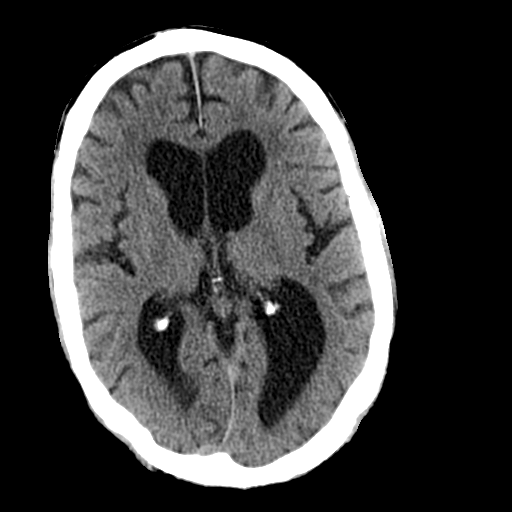
[im 20/42  brain]
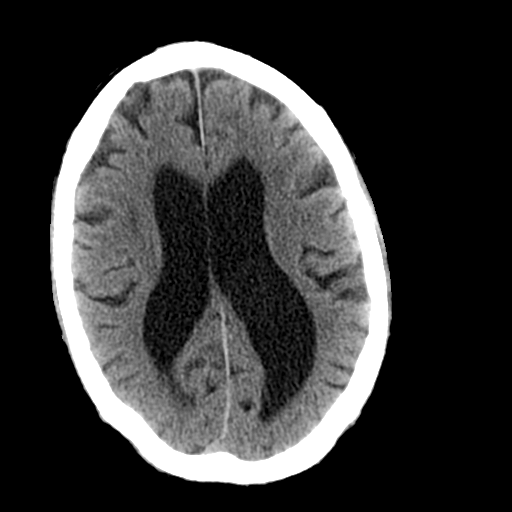
[im 22/42  brain]
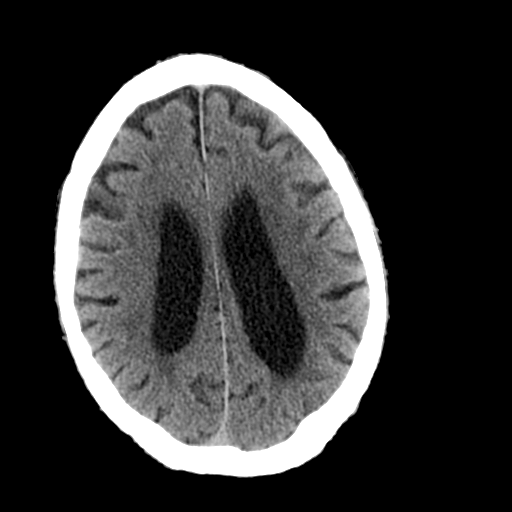
[im 22/42  bone]
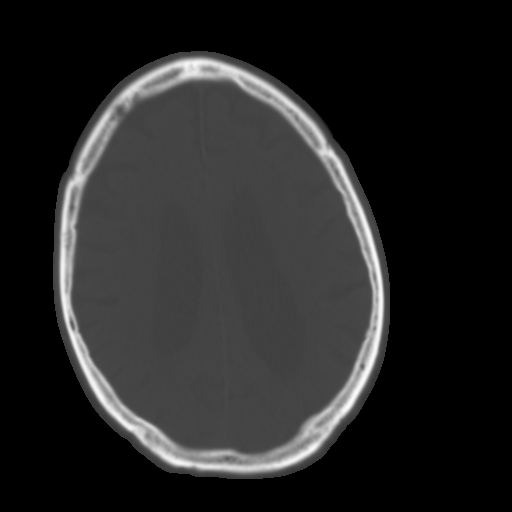
[im 25/42  brain]
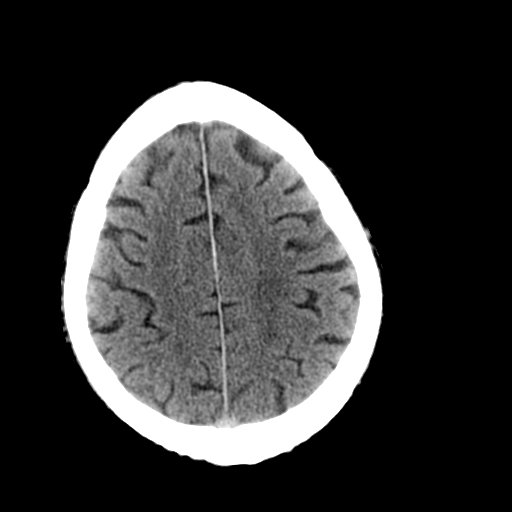
[im 27/42  brain]
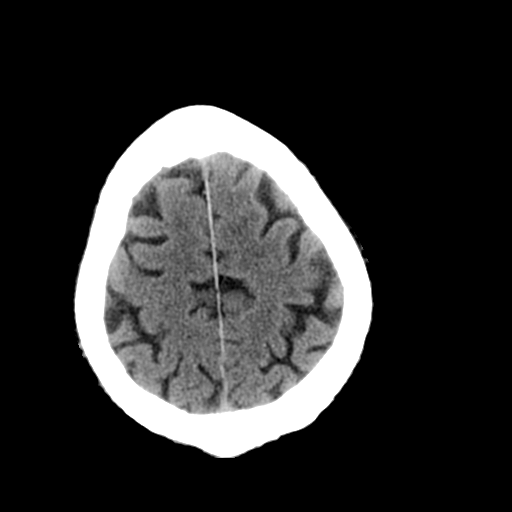
[im 30/42  brain]
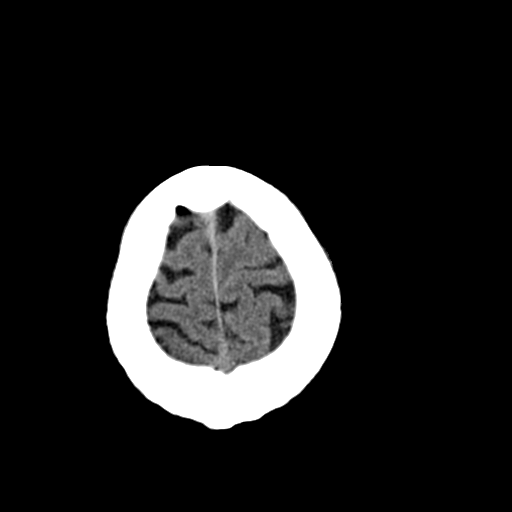
[im 32/42  brain]
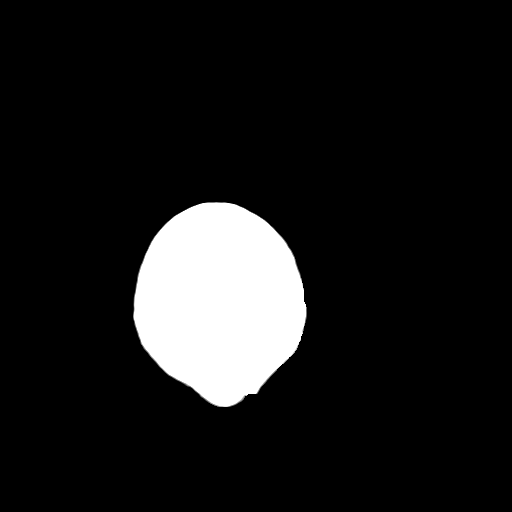
[im 32/42  bone]
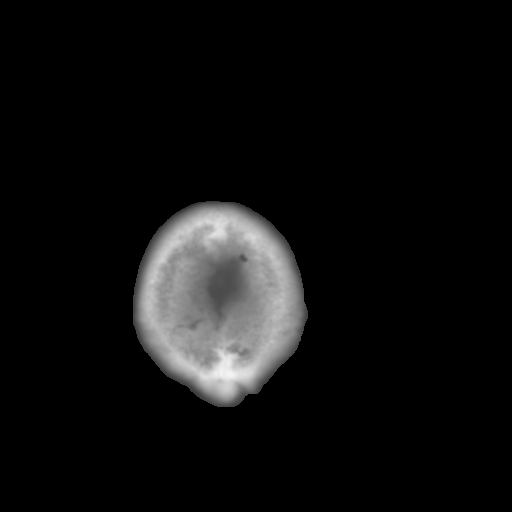
[im 34/42  brain]
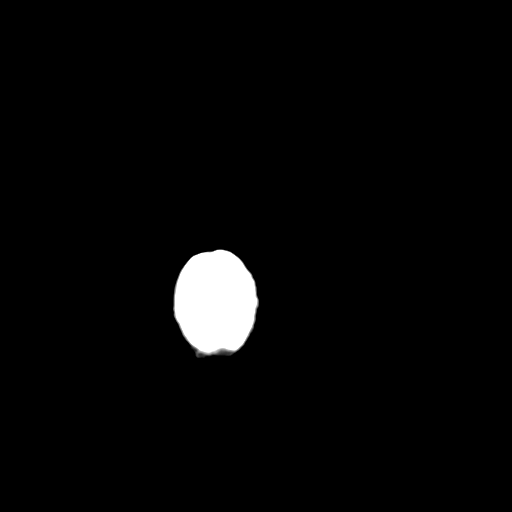
[im 37/42  brain]
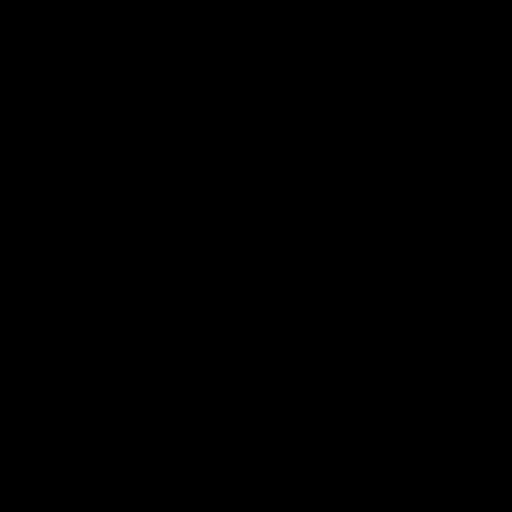
[im 40/42  brain]
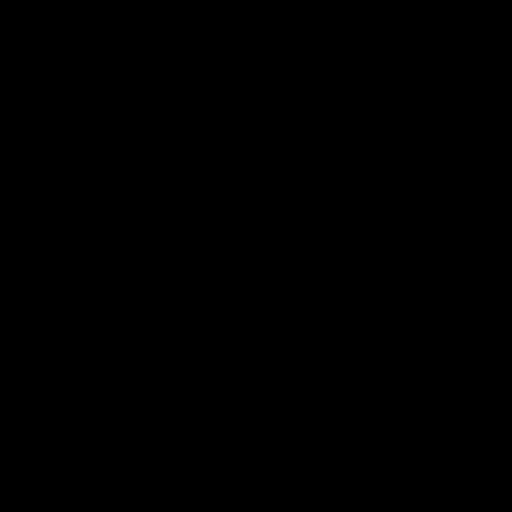

[16 of 30 positions shown; findings below may reference images not displayed]

FINDINGS: No mass lesion, mass effect, midline shift, hydrocephalus,
hemorrhage. No acute territorial cortical ischemia/infarct. Atrophy
and chronic ischemic white matter disease is present. Mastoid air
cells are clear. Visible paranasal sinuses clear. Scout image
appears within normal limits.
IMPRESSION: Atrophy and chronic ischemic white matter disease without acute
intracranial abnormality. No change from 11/30/2013.

## 2016-07-18 ENCOUNTER — Other Ambulatory Visit (HOSPITAL_COMMUNITY): Payer: Self-pay | Admitting: Nurse Practitioner

## 2016-07-18 DIAGNOSIS — M79602 Pain in left arm: Secondary | ICD-10-CM

## 2016-07-22 ENCOUNTER — Telehealth: Payer: Self-pay | Admitting: Neurology

## 2016-07-22 NOTE — Telephone Encounter (Signed)
Joseph Bowen with [facility] called and wanted to get PT in to see Dr Karel JarvisAquino, he is falling and eyes rolling back/Dawn CB# [phone]

## 2016-07-22 NOTE — Telephone Encounter (Signed)
Please advise 

## 2016-07-24 NOTE — Telephone Encounter (Signed)
I have an opening on Friday 830, if he can come in then. Thanks

## 2016-07-24 NOTE — Telephone Encounter (Signed)
Carol notified. Patient coming in Friday at 8:30.

## 2016-07-25 ENCOUNTER — Ambulatory Visit (HOSPITAL_COMMUNITY)
Admission: RE | Admit: 2016-07-25 | Discharge: 2016-07-25 | Disposition: A | Discharge status: Home / Self Care | Payer: Medicare Other | Source: Ambulatory Visit | Attending: Nurse Practitioner | Admitting: Nurse Practitioner

## 2016-07-25 DIAGNOSIS — M47812 Spondylosis without myelopathy or radiculopathy, cervical region: Secondary | ICD-10-CM | POA: Insufficient documentation

## 2016-07-25 DIAGNOSIS — M79602 Pain in left arm: Secondary | ICD-10-CM | POA: Diagnosis present

## 2016-07-25 DIAGNOSIS — M2578 Osteophyte, vertebrae: Secondary | ICD-10-CM | POA: Diagnosis not present

## 2016-07-26 ENCOUNTER — Encounter: Payer: Self-pay | Admitting: *Deleted

## 2016-07-26 ENCOUNTER — Ambulatory Visit (INDEPENDENT_AMBULATORY_CARE_PROVIDER_SITE_OTHER): Payer: Medicare Other | Admitting: Neurology

## 2016-07-26 ENCOUNTER — Encounter: Payer: Self-pay | Admitting: Neurology

## 2016-07-26 ENCOUNTER — Telehealth: Payer: Self-pay | Admitting: *Deleted

## 2016-07-26 VITALS — BP 126/78 | HR 73 | Ht 68.0 in | Wt 150.5 lb

## 2016-07-26 DIAGNOSIS — R55 Syncope and collapse: Secondary | ICD-10-CM | POA: Diagnosis not present

## 2016-07-26 DIAGNOSIS — M5412 Radiculopathy, cervical region: Secondary | ICD-10-CM | POA: Insufficient documentation

## 2016-07-26 DIAGNOSIS — F1096 Alcohol use, unspecified with alcohol-induced persisting amnestic disorder: Secondary | ICD-10-CM

## 2016-07-26 NOTE — Telephone Encounter (Signed)
Selena BattenKim, NP - LM asking if Dr. Diona BrownerMcDowell would obtain cardiac event monitor for syncope for this pt prior to 2/1 appt - pt [facility] today and suggests that the syncope is cardiac related - will route to provider

## 2016-07-26 NOTE — Progress Notes (Signed)
NEUROLOGY FOLLOW UP OFFICE NOTE  Joseph Bowen [id number]  HISTORY OF PRESENT ILLNESS: I had the pleasure of seeing Joseph Bowen in follow-up in the neurology clinic on 07/26/2016. The patient was last seen 9 months ago for memory loss and syncope.He is again accompanied by his legal guardian Joseph Bowen and SNF staff who help supplement information today. He presents for an earlier visit due to dizziness and report of "eyes rolling back" with recent fall on 07/18/16. He has also been referred to Cardiology for 1st degree heart block on recent EKG. Joseph Bowen reports 2 episodes of passing out on 07/19/15 and 07/21/15. He has poor memory, but does recall that both of them occurred when he went from sitting to standing position. Joseph Bowen reports that he remembers having a bad memory day, looking for his truck, trying to leave [facility], when he could recall that he felt lightheaded and felt his eyes roll back. They checked his glucose and BP both times, BP after the second episode was 120/62, glucose levels normal (she does not have the values). He had previously been having syncopal episodes that stopped after Minipress dose was reduced. Joseph Bowen is very hesitant to further reduce this, as this is being used off-label for his night terrors. They have not discussed this with his psychiatrist because she states there is a big turnover of physicians, and they want to make big changes each time instead of adjusting his current medication or adding on. He would be brought to the hospital in the past when psychiatric medications were suddenly changed. Joseph Bowen wonders if he has Seasonal Affective Disorder, he always seems to worsen between November to March, with more behaviors and hospital visits. He states he is not depressed. He states he used to be depressed and does not feel that way currently, stating he feels normal. He has been having left shoulder and arm pain constantly for the past 2 weeks. He has some tingling in his  left hand. He denies any neck pain, no bowel/bladder dysfunction. Right side is unaffected. He had an MRI C-spine yesterday which showed disc bulging at C5-6 effacing the ventral thecal sac, with moderately severe to severe foraminal narrowing, worse on right; moderately severe to severe bilateral foraminal narrowing C6-7; severe bilateral foraminal narrowing C7-T1.   HPI: This is a pleasant 71 yo LH man with a history of hypertension, diabetes, PTSD, who presented for memory loss and syncope. SNF staff reports that they first got involved with Joseph Bowen in December 2011 after he was admitted for hypoglycemia. 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 He went back to his trailer and apparently did not respond to visitors that police broke down his door and found him unresponsive with a blood sugar of 9. He was in a coma for 3 days, and Joseph Bowen reports that his memory issues became more noticeable. Prior to that, he would get lost driving (also had DUI) and had missed utility bills. He has been at [facility] since then, where his health has overall improved. He is not drinking or smoking anymore. His memory has improved some, however Joseph Bowen reports that he "cannot keep his life in chronological order." He wakes up some days thinking he is 71 years old and would want to go for a drive, or thinks he is in [place] or [place]. 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  He continues to read voraciously and plays chess, but gets frustrated when he realizes he cannot do certain things. He himself reports that his memory is "bad," mainly with short-term memory. He is not aware of family history of memory issues. He is a [place] veteran, denies any head injuries. He has PTSD and night terrors, treated with Prazosin per Joseph Bowen. Joseph Bowen reports exposure to [employer] [place].  Last November 2015, he was in  the shower and fell. It was unclear if he lost consciousness, however on 06/12/14, he was sitting down playing chess when he suddenly slumped down. His blood sugar was 130. He was brought to his bed and woke up, then passed out again while lying on the bed. This happened a third time and his BP was noted to be low. Lisinopril was discontinued. He saw Cardiology and Prazosin dose was reduced. No further syncopal episodes since then. No worsening of night terrors.   Diagnostic data: I personally reviewed MRI brain without contrast which showed moderate diffuse volume loss with chronic ventriculomegaly, stable and fairly diminutive appearance of the temporal horns, no transependymal edema suspected. There was moderate chronic microvascular disease.  PAST MEDICAL HISTORY: Past Medical History:  Diagnosis Date  . Anemia   . Anxiety   . Benign prostatic hypertrophy   . Chronic pancreatitis (HCC)   . COPD (chronic obstructive pulmonary disease) (HCC)   . Dementia   . Dementia    due to encephalopathy from diabetic coma 2011.  . Depression   . 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  . Essential hypertension   . Euthyroid sick syndrome   . Glaucoma   . Hypoglycemia   . Personal history of alcoholism (HCC)   . Post traumatic stress disorder   . Psychosis   . Retinopathy   . Type 2 diabetes mellitus (HCC)   . Vitamin B deficiency   . Vitamin D deficiency     MEDICATIONS: Current Outpatient Prescriptions on File Prior to Visit  Medication Sig Dispense Refill  . aspirin 81 MG chewable tablet Chew 81 mg by mouth daily.    Marland Kitchen. levothyroxine (SYNTHROID, LEVOTHROID) 75 MCG tablet Take 75 mcg by mouth daily before breakfast.    . sennosides-docusate sodium (SENOKOT-S) 8.6-50 MG tablet Take 1 tablet by mouth 2 (two) times daily.    Marland Kitchen. thiamine 100 MG tablet Take 100 mg by mouth daily.    Marland Kitchen. venlafaxine (EFFEXOR) 75 MG tablet Take 225 mg by mouth daily.    Marland Kitchen. Dextromethorphan-Quinidine  (NUEDEXTA) 20-10 MG CAPS Take 1 capsule by mouth 2 (two) times daily.     Marland Kitchen. donepezil (ARICEPT) 10 MG tablet Take 10 mg by mouth at bedtime.    . gabapentin (NEURONTIN) 300 MG capsule Take 300 mg by mouth 3 (three) times daily.    . insulin glargine (LANTUS) 100 UNIT/ML injection Inject 22 Units into the skin daily.     . insulin lispro (HUMALOG) 100 UNIT/ML injection Inject 3 Units into the skin 3 (three) times daily with meals. If blood sugar is >200.    Marland Kitchen. lipase/protease/amylase (CREON-10/PANCREASE) 12000 UNITS CPEP Take 3 capsules by mouth 3 (three) times daily with meals. **Take 30 minutes before meals**    . LORazepam (ATIVAN) 0.5 MG tablet Take one tablet by mouth three times daily; Take one tablet by mouth twice daily as needed for breakthrough anxiety (Patient taking differently: Take 0.5 mg by mouth 3 (three) times daily. ) 150 tablet 5  . Melatonin 3 MG  TABS Take 3 mg by mouth at bedtime.    . metFORMIN (GLUCOPHAGE) 1000 MG tablet Take 1,000 mg by mouth 2 (two) times daily with a meal.    . OLANZapine (ZYPREXA) 10 MG tablet Take 10 mg by mouth at bedtime.    . prazosin (MINIPRESS) 2 MG capsule Take 2 mg by mouth at bedtime.    Marland Kitchen Propylene Glycol (SYSTANE BALANCE) 0.6 % SOLN Place 1 drop into both eyes at bedtime.    . sitaGLIPtin (JANUVIA) 50 MG tablet Take 50 mg by mouth daily.    . Vitamin D, Ergocalciferol, (DRISDOL) 50000 UNITS CAPS capsule Take 50,000 Units by mouth every 30 (thirty) days.     No current facility-administered medications on file prior to visit.     ALLERGIES: No Known Allergies  FAMILY HISTORY: Family History  Problem Relation Age of Onset  . Heart disease    . Diabetes      SOCIAL HISTORY: Social History   Social History  . Marital status: Divorced    Spouse name: N/A  . Number of children: N/A  . Years of education: N/A   Occupational History  . Not on file.   Social History Main Topics  . Smoking status: Current Some Day Smoker     Packs/day: 2.00    Years: 48.00    Types: Cigarettes    Start date: 09/17/1961    Last attempt to quit: 09/17/2009  . Smokeless tobacco: Never Used  . Alcohol use No     Comment: hx of alcoholism-last used 2011  . Drug use: No  . Sexual activity: No   Other Topics Concern  . Not on file   Social History Narrative  . No narrative on file    REVIEW OF SYSTEMS: Constitutional: No fevers, chills, or sweats, no generalized fatigue, change in appetite Eyes: No visual changes, double vision, eye pain Ear, nose and throat: No hearing loss, ear pain, nasal congestion, sore throat Cardiovascular: No chest pain, palpitations Respiratory:  No shortness of breath at rest or with exertion, wheezes GastrointestinaI: No nausea, vomiting, diarrhea, abdominal pain, fecal incontinence Genitourinary:  No dysuria, urinary retention or frequency Musculoskeletal:  No neck pain, back pain Integumentary: No rash, pruritus, skin lesions Neurological: as above Psychiatric: No depression, insomnia, anxiety Endocrine: No palpitations, fatigue, diaphoresis, mood swings, change in appetite, change in weight, increased thirst Hematologic/Lymphatic:  No anemia, purpura, petechiae. Allergic/Immunologic: no itchy/runny eyes, nasal congestion, recent allergic reactions, rashes  PHYSICAL EXAM: Vitals:   07/26/16 0827  BP: 126/78  Pulse: 73   Orthostatic VS for the past 24 hrs (Last 3 readings):  BP- Lying Pulse- Lying BP- Sitting Pulse- Sitting BP- Standing at 0 minutes Pulse- Standing at 0 minutes  07/26/16 0915 130/78 64 132/80 67 124/72 78   General: No acute distress Head:  Normocephalic/atraumatic Neck: supple, no paraspinal tenderness, full range of motion Heart:  Regular rate and rhythm Lungs:  Clear to auscultation bilaterally Back: No paraspinal tenderness Skin/Extremities: No rash, no edema Neurological Exam: alert and oriented to person, place, states it is January 2017. Initially unable to name  president, but chose correctly with multiple choice. No aphasia or dysarthria. Fund of knowledge is appropriate.  Recent and remote memory are impaired. 1/3 delayed recall.  Attention and concentration are normal.    Able to name objects and repeat phrases. Cranial nerves: Pupils equal, round, reactive to light.  Extraocular movements intact with no nystagmus. Visual fields full. Facial sensation intact. No facial asymmetry.  Tongue, uvula, palate midline.  Motor: Bulk and tone normal, muscle strength 5/5 throughout with no pronator drift.  Sensation to light touch intact.  No extinction to double simultaneous stimulation.  Deep tendon reflexes 2+ throughout, toes downgoing.  Finger to nose testing intact.  Gait narrow-based and steady, able to tandem walk adequately.  Romberg negative. 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 MRI brain without contrast showed volume loss with chronic ventriculomegaly. He presents for an earlier visit for 2 recent episodes of syncope preceded by lightheadedness when patient stood up, suggestive of orthostatic hypotension. Orthostatic vital signs today do not strictly meet criteria for orthostatic hypotension, but there is note of a drop in his BP standing and increase in heart rate. He had syncopal episodes in the past that improved after Minipress dose was adjusted, however his guardian is concerned about the night terrors worsening if Minipress is further reduced. She was advised to speak to his psychiatrist about other options for treatment of night terrors. He is also having constant left arm pain radiating down to his fingers, cervical MRI showed radiculopathy. PT will be ordered. Continue all other  medications, including Aricept and thiamine. He will follow-up in 1 year and knows to call our office for any changes.   Thank you for allowing me to participate in his care.  Please do not hesitate to call for any questions or concerns.  The duration of this appointment visit was 25 minutes of face-to-face time with the patient.  Greater than 50% of this time was spent in counseling, explanation of diagnosis, planning of further management, and coordination of care.   Patrcia Dolly, M.D.   CC: Dr. Virgina Organ

## 2016-07-26 NOTE — Telephone Encounter (Signed)
Actually, the neurology note indicates suspicion for orthostatic hypotension which is what I suggested back in 2015 as well. A cardiac monitor would not further evaluate this particular problem. It is probably going to go back to recommendations regarding medication adjustments.

## 2016-07-26 NOTE — Patient Instructions (Signed)
1. Proceed with Cardiology visit as scheduled 2. Start Physical Therapy for left cervical radiculopathy 3. Discuss other potential night terror treatments with Psychiatry 4. Follow-up in 1 year, call for any changes

## 2016-07-26 NOTE — Telephone Encounter (Signed)
Kim made aware - says [facility] was [facility] on 07/[facility]/16 - will request

## 2016-08-14 NOTE — Progress Notes (Signed)
Cardiology Office Note  Date: 08/15/2016   ID: Joseph Bowen, DOB 01/07/1946, MRN [id number]  PCP: Colon BranchQURESHI, AYYAZ, MD  Primary Cardiologist: Nona DellSamuel McDowell, MD   Chief Complaint  Patient presents with  . History of syncope    History of Present Illness: Joseph Bowen is a 71 y.o. male that has not been seen in the office since December 2015 when he was evaluated in consultation. [facility], reportedly due to finding of prolonged PR interval on ECG based on limited information. He is following with Neurology, just recently seen for history of orthostatic syncope.  He presents today with an Geophysicist/field seismologistassistant, states that he has had 4 episodes of syncope while standing over the last several weeks. He states that he try stand up slowly, still has trouble sometimes and feels unsteady after he has been up walking. He does not report any palpitations.  Medications have been modified over time to try to reduce orthostasis. We obtained an ECG today which showed normal sinus rhythm and normal intervals. Orthostatics were also obtained with a noted drop in systolic blood pressure from 124 supine to 110 seated and then 90 standing. Heart rate did not however increase very much going from 86 bpm up to 89 bpm. He did feel somewhat lightheaded.  Past Medical History:  Diagnosis Date  . Anemia   . Anxiety   . Benign prostatic hypertrophy   . Chronic pancreatitis (HCC)   . COPD (chronic obstructive pulmonary disease) (HCC)   . Dementia   . Dementia    due to encephalopathy from diabetic coma 2011.  . Depression   . 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  . Essential hypertension   . Euthyroid sick syndrome   . Glaucoma   . Hypoglycemia   . Personal history of alcoholism (HCC)   . Post traumatic stress disorder   . Psychosis   . Retinopathy   . Type 2 diabetes mellitus (HCC)   . Vitamin B deficiency   . Vitamin D deficiency     Past Surgical History:  Procedure  Laterality Date  . BACK SURGERY     removal of melanoma of back.  . COLONOSCOPY WITH PROPOFOL N/A 08/15/2015   Procedure: COLONOSCOPY WITH PROPOFOL;  Surgeon: West BaliSandi L Fields, MD;  Location: [facility];  Service: Endoscopy;  Laterality: N/A;  1215pm  . HAND SURGERY Right    middle finger; fractue  . HEMORRHOID SURGERY    . HERNIA REPAIR Left    inguinal  . HIP SURGERY Bilateral    bilateral hip fractures  . MOHS SURGERY     Melanoma    Current Outpatient Prescriptions  Medication Sig Dispense Refill  . aspirin EC 81 MG tablet Take 81 mg by mouth daily.    Marland Kitchen. Dextromethorphan-Quinidine (NUEDEXTA) 20-10 MG CAPS Take 1 capsule by mouth 2 (two) times daily.     Marland Kitchen. donepezil (ARICEPT) 5 MG tablet Take 5 mg by mouth at bedtime.    . gabapentin (NEURONTIN) 300 MG capsule Take 300 mg by mouth 3 (three) times daily.    . insulin glargine (LANTUS) 100 UNIT/ML injection Inject 18 Units into the skin daily.     . insulin lispro (HUMALOG) 100 UNIT/ML injection Inject 3 Units into the skin 3 (three) times daily with meals. If blood sugar is >200.    Marland Kitchen. levothyroxine (SYNTHROID, LEVOTHROID) 75 MCG tablet Take 75 mcg by mouth daily before breakfast.    .  lipase/protease/amylase (CREON-10/PANCREASE) 12000 UNITS CPEP Take 3 capsules by mouth 3 (three) times daily with meals. **Take 30 minutes before meals**    . LORazepam (ATIVAN) 0.5 MG tablet Take one tablet by mouth three times daily; Take one tablet by mouth twice daily as needed for breakthrough anxiety (Patient taking differently: Take 0.5 mg by mouth 3 (three) times daily. ) 150 tablet 5  . Melatonin 3 MG TABS Take 3 mg by mouth at bedtime.    . metFORMIN (GLUCOPHAGE) 1000 MG tablet Take 1,000 mg by mouth 2 (two) times daily with a meal.    . OLANZapine (ZYPREXA) 10 MG tablet Take 10 mg by mouth at bedtime.    . prazosin (MINIPRESS) 2 MG capsule Take 2 mg by mouth at bedtime.    Marland Kitchen Propylene Glycol (SYSTANE BALANCE) 0.6 % SOLN Place 1 drop into both  eyes at bedtime.    . rosuvastatin (CRESTOR) 10 MG tablet Take 10 mg by mouth daily.    . sennosides-docusate sodium (SENOKOT-S) 8.6-50 MG tablet Take 1 tablet by mouth 2 (two) times daily.    . sitaGLIPtin (JANUVIA) 50 MG tablet Take 50 mg by mouth daily.    Marland Kitchen thiamine 100 MG tablet Take 100 mg by mouth daily.    . traMADol (ULTRAM) 50 MG tablet Take 25 mg by mouth every 8 (eight) hours as needed.    . venlafaxine XR (EFFEXOR-XR) 150 MG 24 hr capsule Take 150 mg by mouth daily with breakfast.    . Vitamin D, Ergocalciferol, (DRISDOL) 50000 UNITS CAPS capsule Take 50,000 Units by mouth every 30 (thirty) days.    . fludrocortisone (FLORINEF) 0.1 MG tablet Take 1 tablet (0.1 mg total) by mouth daily. 30 tablet 6   No current facility-administered medications for this visit.    Allergies:  Patient has no known allergies.   Social History: The patient  reports that he has been smoking Cigarettes.  He started smoking about 54 years ago. He has a 72.00 pack-year smoking history. He has never used smokeless tobacco. He reports that he does not drink alcohol or use drugs.   Family History: The patient's family history is not on file.   ROS:  Please see the history of present illness. Otherwise, complete review of systems is positive for chronic memory problems and dementia.  All other systems are reviewed and negative.   Physical Exam: VS:  BP 134/75   Pulse 86   Ht 5\' 8"  (1.727 m)   Wt 152 lb 12.8 oz (69.3 kg)   SpO2 97%   BMI 23.23 kg/m , BMI Body mass index is 23.23 kg/m.  Wt Readings from Last 3 Encounters:  08/15/16 152 lb 12.8 oz (69.3 kg)  07/26/16 150 lb 8 oz (68.3 kg)  10/20/15 157 lb (71.2 kg)    Chronically ill-appearing male, no distress. HEENT: Conjunctiva and lids normal, oropharynx clear with poor dentition.. Neck: Supple, no elevated JVP or carotid bruits, no thyromegaly. Lungs: Clear to auscultation, nonlabored breathing at rest. Cardiac: Regular rate and rhythm, no S3  or significant systolic murmur, no pericardial rub. Abdomen: Soft, nontender, bowel sounds present. Extremities: No pitting edema, distal pulses 2+. Skin: Warm and dry. Musculoskeletal: No kyphosis. Neuropsychiatric: Alert and oriented x2, affect grossly appropriate.  ECG: I personally reviewed the tracing from 08/10/2015 which showed normal sinus rhythm with PR interval 204 ms.  Recent Labwork:  January 2017: Potassium 4.8, BUN 15, creatinine 0.9, hemoglobin 13.8, platelets 207  Assessment and Plan:  1.  History of orthostatic syncope. This is most likely multifactorial associated with some medication use and also autonomic dysfunction. We will try Florinef 0.1 mg daily to see if a volume expander may help ameliorate some of the symptoms. Doubt that this is arrhythmogenic however. His ECG today is normal.  2. History of hypertension.  3. Hyperlipidemia, on Crestor.  4. Type 2 diabetes mellitus, insulin requiring.  Current medicines were reviewed with the patient today.   Orders Placed This Encounter  Procedures  . EKG 12-Lead    Disposition: Follow-up in 6 months.  Signed, Jonelle Sidle, MD, [facility] 08/15/2016 3:31 PM    [facility] at [facility] [address] [place], [place], [place] [address] Phone: [phone]; Fax: [phone]

## 2016-08-15 ENCOUNTER — Encounter: Payer: Self-pay | Admitting: Cardiology

## 2016-08-15 ENCOUNTER — Ambulatory Visit (INDEPENDENT_AMBULATORY_CARE_PROVIDER_SITE_OTHER): Payer: Medicare Other | Admitting: Cardiology

## 2016-08-15 VITALS — BP 134/75 | HR 86 | Ht 68.0 in | Wt 152.8 lb

## 2016-08-15 DIAGNOSIS — E118 Type 2 diabetes mellitus with unspecified complications: Secondary | ICD-10-CM

## 2016-08-15 DIAGNOSIS — Z794 Long term (current) use of insulin: Secondary | ICD-10-CM

## 2016-08-15 DIAGNOSIS — Z8679 Personal history of other diseases of the circulatory system: Secondary | ICD-10-CM

## 2016-08-15 DIAGNOSIS — I951 Orthostatic hypotension: Secondary | ICD-10-CM | POA: Diagnosis not present

## 2016-08-15 DIAGNOSIS — E782 Mixed hyperlipidemia: Secondary | ICD-10-CM

## 2016-08-15 MED ORDER — FLUDROCORTISONE ACETATE 0.1 MG PO TABS: 0.1000 mg | ORAL_TABLET | Freq: Every day | ORAL | 6 refills | Status: DC

## 2016-08-15 NOTE — Patient Instructions (Signed)
Your physician wants you to follow-up in: 6 months with Dr. Diona BrownerMcdowell. You will receive a reminder letter in the mail two months in advance. If you don't receive a letter, please call our office to schedule the follow-up appointment.   Your physician has recommended you make the following change in your medication:  Start Florinef 0.1mg  daily   Thank you for choosing [facility]!!

## 2016-09-04 ENCOUNTER — Inpatient Hospital Stay (HOSPITAL_COMMUNITY)
Admission: EM | Admit: 2016-09-04 | Discharge: 2016-09-10 | DRG: 392 | Disposition: A | Discharge status: Skilled Nursing Facility | Payer: Medicare Other | Attending: Family Medicine | Admitting: Family Medicine

## 2016-09-04 ENCOUNTER — Encounter (HOSPITAL_COMMUNITY): Payer: Self-pay | Admitting: Emergency Medicine

## 2016-09-04 ENCOUNTER — Emergency Department (HOSPITAL_COMMUNITY): Payer: Medicare Other

## 2016-09-04 DIAGNOSIS — E1165 Type 2 diabetes mellitus with hyperglycemia: Secondary | ICD-10-CM

## 2016-09-04 DIAGNOSIS — F4310 Post-traumatic stress disorder, unspecified: Secondary | ICD-10-CM | POA: Diagnosis present

## 2016-09-04 DIAGNOSIS — Z794 Long term (current) use of insulin: Secondary | ICD-10-CM | POA: Diagnosis not present

## 2016-09-04 DIAGNOSIS — IMO0002 Reserved for concepts with insufficient information to code with codable children: Secondary | ICD-10-CM

## 2016-09-04 DIAGNOSIS — J449 Chronic obstructive pulmonary disease, unspecified: Secondary | ICD-10-CM | POA: Diagnosis present

## 2016-09-04 DIAGNOSIS — N400 Enlarged prostate without lower urinary tract symptoms: Secondary | ICD-10-CM

## 2016-09-04 DIAGNOSIS — E11319 Type 2 diabetes mellitus with unspecified diabetic retinopathy without macular edema: Secondary | ICD-10-CM | POA: Diagnosis present

## 2016-09-04 DIAGNOSIS — R451 Restlessness and agitation: Secondary | ICD-10-CM | POA: Diagnosis present

## 2016-09-04 DIAGNOSIS — F1021 Alcohol dependence, in remission: Secondary | ICD-10-CM | POA: Diagnosis present

## 2016-09-04 DIAGNOSIS — K5901 Slow transit constipation: Secondary | ICD-10-CM | POA: Diagnosis present

## 2016-09-04 DIAGNOSIS — K529 Noninfective gastroenteritis and colitis, unspecified: Secondary | ICD-10-CM | POA: Diagnosis present

## 2016-09-04 DIAGNOSIS — N433 Hydrocele, unspecified: Secondary | ICD-10-CM

## 2016-09-04 DIAGNOSIS — Z85820 Personal history of malignant melanoma of skin: Secondary | ICD-10-CM

## 2016-09-04 DIAGNOSIS — E118 Type 2 diabetes mellitus with unspecified complications: Secondary | ICD-10-CM | POA: Diagnosis not present

## 2016-09-04 DIAGNOSIS — N4400 Torsion of testis, unspecified: Secondary | ICD-10-CM

## 2016-09-04 DIAGNOSIS — I10 Essential (primary) hypertension: Secondary | ICD-10-CM | POA: Diagnosis present

## 2016-09-04 DIAGNOSIS — F1026 Alcohol dependence with alcohol-induced persisting amnestic disorder: Secondary | ICD-10-CM | POA: Diagnosis present

## 2016-09-04 DIAGNOSIS — F03918 Unspecified dementia, unspecified severity, with other behavioral disturbance: Secondary | ICD-10-CM | POA: Diagnosis present

## 2016-09-04 DIAGNOSIS — F0390 Unspecified dementia without behavioral disturbance: Secondary | ICD-10-CM

## 2016-09-04 DIAGNOSIS — E119 Type 2 diabetes mellitus without complications: Secondary | ICD-10-CM

## 2016-09-04 DIAGNOSIS — F1096 Alcohol use, unspecified with alcohol-induced persisting amnestic disorder: Secondary | ICD-10-CM | POA: Diagnosis present

## 2016-09-04 DIAGNOSIS — R109 Unspecified abdominal pain: Secondary | ICD-10-CM | POA: Diagnosis not present

## 2016-09-04 DIAGNOSIS — H409 Unspecified glaucoma: Secondary | ICD-10-CM | POA: Diagnosis present

## 2016-09-04 DIAGNOSIS — R000 Tachycardia, unspecified: Secondary | ICD-10-CM | POA: Diagnosis present

## 2016-09-04 DIAGNOSIS — F0280 Dementia in other diseases classified elsewhere without behavioral disturbance: Secondary | ICD-10-CM | POA: Diagnosis present

## 2016-09-04 DIAGNOSIS — F0391 Unspecified dementia with behavioral disturbance: Secondary | ICD-10-CM | POA: Diagnosis present

## 2016-09-04 DIAGNOSIS — N432 Other hydrocele: Secondary | ICD-10-CM

## 2016-09-04 DIAGNOSIS — Z833 Family history of diabetes mellitus: Secondary | ICD-10-CM

## 2016-09-04 DIAGNOSIS — R229 Localized swelling, mass and lump, unspecified: Secondary | ICD-10-CM

## 2016-09-04 DIAGNOSIS — F17210 Nicotine dependence, cigarettes, uncomplicated: Secondary | ICD-10-CM | POA: Diagnosis present

## 2016-09-04 LAB — ETHANOL: Alcohol, Ethyl (B): <5 mg/dL (ref <5)

## 2016-09-04 LAB — RAPID URINE DRUG SCREEN, HOSP PERFORMED
Amphetamines: NOT DETECTED
Barbiturates: NOT DETECTED
Benzodiazepines: POSITIVE — AB
Cocaine: NOT DETECTED
Opiates: NOT DETECTED
Tetrahydrocannabinol: NOT DETECTED

## 2016-09-04 LAB — URINALYSIS, ROUTINE W REFLEX MICROSCOPIC
Bilirubin Urine: NEGATIVE
Glucose, UA: NEGATIVE mg/dL
Hgb urine dipstick: NEGATIVE
Ketones, ur: 5 mg/dL — AB
Leukocytes, UA: NEGATIVE
Nitrite: NEGATIVE
Protein, ur: NEGATIVE mg/dL
Specific Gravity, Urine: 1.021 (ref 1.005–1.030)
pH: 5 (ref 5.0–8.0)

## 2016-09-04 LAB — CBC WITH DIFFERENTIAL/PLATELET
Basophils Absolute: 0 10*3/uL (ref 0.0–0.1)
Basophils Relative: 0 %
Eosinophils Absolute: 0 10*3/uL (ref 0.0–0.7)
Eosinophils Relative: 1 %
HCT: 40.8 % (ref 39.0–52.0)
Hemoglobin: 13.5 g/dL (ref 13.0–17.0)
Lymphocytes Relative: 9 %
Lymphs Abs: 0.5 10*3/uL — ABNORMAL LOW (ref 0.7–4.0)
MCH: 29.5 pg (ref 26.0–34.0)
MCHC: 33.1 g/dL (ref 30.0–36.0)
MCV: 89.1 fL (ref 78.0–100.0)
Monocytes Absolute: 0.5 10*3/uL (ref 0.1–1.0)
Monocytes Relative: 9 %
Neutro Abs: 4.8 10*3/uL (ref 1.7–7.7)
Neutrophils Relative %: 81 %
Platelets: 242 10*3/uL (ref 150–400)
RBC: 4.58 MIL/uL (ref 4.22–5.81)
RDW: 13.5 % (ref 11.5–15.5)
WBC: 5.8 10*3/uL (ref 4.0–10.5)

## 2016-09-04 LAB — COMPREHENSIVE METABOLIC PANEL
ALT: 18 U/L (ref 17–63)
AST: 45 U/L — ABNORMAL HIGH (ref 15–41)
Albumin: 3.5 g/dL (ref 3.5–5.0)
Alkaline Phosphatase: 75 U/L (ref 38–126)
Anion gap: 10 (ref 5–15)
BUN: 14 mg/dL (ref 6–20)
CO2: 28 mmol/L (ref 22–32)
Calcium: 8.5 mg/dL — ABNORMAL LOW (ref 8.9–10.3)
Chloride: 101 mmol/L (ref 101–111)
Creatinine, Ser: 0.75 mg/dL (ref 0.61–1.24)
GFR calc Af Amer: >60 mL/min (ref >60)
GFR calc non Af Amer: >60 mL/min (ref >60)
Glucose, Bld: 61 mg/dL — ABNORMAL LOW (ref 65–99)
Potassium: 3 mmol/L — ABNORMAL LOW (ref 3.5–5.1)
Sodium: 139 mmol/L (ref 135–145)
Total Bilirubin: 0.6 mg/dL (ref 0.3–1.2)
Total Protein: 6.5 g/dL (ref 6.5–8.1)

## 2016-09-04 LAB — LACTIC ACID, PLASMA: Lactic Acid, Venous: 1 mmol/L (ref 0.5–1.9)

## 2016-09-04 LAB — LIPASE, BLOOD: Lipase: <10 U/L — ABNORMAL LOW (ref 11–51)

## 2016-09-04 LAB — CBG MONITORING, ED
Glucose-Capillary: 45 mg/dL — ABNORMAL LOW (ref 65–99)
Glucose-Capillary: 104 mg/dL — ABNORMAL HIGH (ref 65–99)

## 2016-09-04 MED ORDER — LORAZEPAM 0.5 MG PO TABS
0.5000 mg | ORAL_TABLET | Freq: Three times a day (TID) | ORAL | Status: DC
  Administered 2016-09-05 – 2016-09-10 (×14): 0.5 mg via ORAL
  Filled 2016-09-04 (×15): qty 1

## 2016-09-04 MED ORDER — SODIUM CHLORIDE 0.9 % IV SOLN
INTRAVENOUS | Status: DC
  Administered 2016-09-04: 22:00:00 via INTRAVENOUS

## 2016-09-04 MED ORDER — IOPAMIDOL (ISOVUE-300) INJECTION 61%
100.0000 mL | Freq: Once | INTRAVENOUS | Status: AC | PRN
  Administered 2016-09-04: 100 mL via INTRAVENOUS

## 2016-09-04 MED ORDER — OLANZAPINE 5 MG PO TABS
5.0000 mg | ORAL_TABLET | Freq: Every day | ORAL | Status: DC
  Administered 2016-09-05 – 2016-09-09 (×6): 5 mg via ORAL
  Filled 2016-09-04 (×6): qty 1

## 2016-09-04 MED ORDER — VITAMIN B-1 100 MG PO TABS
100.0000 mg | ORAL_TABLET | Freq: Every day | ORAL | Status: DC
  Administered 2016-09-06 – 2016-09-10 (×4): 100 mg via ORAL
  Filled 2016-09-04 (×6): qty 1

## 2016-09-04 MED ORDER — SODIUM CHLORIDE 0.9 % IV BOLUS (SEPSIS)
500.0000 mL | Freq: Once | INTRAVENOUS | Status: AC
  Administered 2016-09-04: 500 mL via INTRAVENOUS

## 2016-09-04 MED ORDER — SERTRALINE HCL 50 MG PO TABS
25.0000 mg | ORAL_TABLET | Freq: Every day | ORAL | Status: DC
  Administered 2016-09-06 – 2016-09-10 (×4): 25 mg via ORAL
  Filled 2016-09-04 (×5): qty 1

## 2016-09-04 MED ORDER — ONDANSETRON HCL 4 MG PO TABS: 4.0000 mg | ORAL_TABLET | Freq: Four times a day (QID) | ORAL | Status: DC | PRN

## 2016-09-04 MED ORDER — ENOXAPARIN SODIUM 40 MG/0.4ML ~~LOC~~ SOLN
40.0000 mg | SUBCUTANEOUS | Status: DC
  Administered 2016-09-06 – 2016-09-10 (×4): 40 mg via SUBCUTANEOUS
  Filled 2016-09-04 (×6): qty 0.4

## 2016-09-04 MED ORDER — PANCRELIPASE (LIP-PROT-AMYL) 12000-38000 UNITS PO CPEP
36000.0000 [IU] | ORAL_CAPSULE | Freq: Three times a day (TID) | ORAL | Status: DC
  Administered 2016-09-06 – 2016-09-10 (×11): 36000 [IU] via ORAL
  Filled 2016-09-04 (×11): qty 3

## 2016-09-04 MED ORDER — PIPERACILLIN-TAZOBACTAM 3.375 G IVPB 30 MIN
3.3750 g | Freq: Once | INTRAVENOUS | Status: AC
  Administered 2016-09-04: 3.375 g via INTRAVENOUS
  Filled 2016-09-04: qty 50

## 2016-09-04 MED ORDER — FENTANYL CITRATE (PF) 100 MCG/2ML IJ SOLN
100.0000 ug | INTRAMUSCULAR | Status: DC | PRN
  Administered 2016-09-04: 100 ug via INTRAVENOUS
  Filled 2016-09-04: qty 2

## 2016-09-04 MED ORDER — ONDANSETRON HCL 4 MG/2ML IJ SOLN
4.0000 mg | Freq: Once | INTRAMUSCULAR | Status: AC
  Administered 2016-09-04: 4 mg via INTRAVENOUS
  Filled 2016-09-04: qty 2

## 2016-09-04 MED ORDER — SODIUM CHLORIDE 0.9 % IV SOLN
INTRAVENOUS | Status: AC
  Administered 2016-09-05: via INTRAVENOUS

## 2016-09-04 MED ORDER — INSULIN ASPART 100 UNIT/ML ~~LOC~~ SOLN
0.0000 [IU] | SUBCUTANEOUS | Status: DC
  Administered 2016-09-06: 5 [IU] via SUBCUTANEOUS
  Administered 2016-09-06: 1 [IU] via SUBCUTANEOUS
  Administered 2016-09-07: 5 [IU] via SUBCUTANEOUS
  Administered 2016-09-08 (×2): 2 [IU] via SUBCUTANEOUS
  Administered 2016-09-08: 7 [IU] via SUBCUTANEOUS
  Administered 2016-09-08: 2 [IU] via SUBCUTANEOUS
  Administered 2016-09-08: 5 [IU] via SUBCUTANEOUS
  Administered 2016-09-08: 7 [IU] via SUBCUTANEOUS
  Administered 2016-09-08: 9 [IU] via SUBCUTANEOUS
  Administered 2016-09-09: 2 [IU] via SUBCUTANEOUS
  Administered 2016-09-09: 5 [IU] via SUBCUTANEOUS
  Administered 2016-09-09: 7 [IU] via SUBCUTANEOUS
  Administered 2016-09-09: 5 [IU] via SUBCUTANEOUS
  Administered 2016-09-10 (×3): 2 [IU] via SUBCUTANEOUS
  Administered 2016-09-10: 5 [IU] via SUBCUTANEOUS

## 2016-09-04 MED ORDER — VENLAFAXINE HCL ER 75 MG PO CP24
150.0000 mg | ORAL_CAPSULE | Freq: Every day | ORAL | Status: DC
  Administered 2016-09-05 – 2016-09-10 (×5): 150 mg via ORAL
  Filled 2016-09-04 (×6): qty 2

## 2016-09-04 MED ORDER — OXYCODONE HCL 5 MG PO TABS
5.0000 mg | ORAL_TABLET | ORAL | Status: DC | PRN
  Administered 2016-09-05: 5 mg via ORAL
  Filled 2016-09-04: qty 1

## 2016-09-04 MED ORDER — SODIUM CHLORIDE 0.9 % IV SOLN: 30.0000 meq | Freq: Once | INTRAVENOUS | Status: DC

## 2016-09-04 MED ORDER — POTASSIUM CHLORIDE 10 MEQ/100ML IV SOLN: 10.0000 meq | INTRAVENOUS | Status: DC

## 2016-09-04 MED ORDER — IOPAMIDOL (ISOVUE-300) INJECTION 61%
INTRAVENOUS | Status: AC
  Filled 2016-09-04: qty 30

## 2016-09-04 MED ORDER — POTASSIUM CHLORIDE IN NACL 20-0.9 MEQ/L-% IV SOLN
INTRAVENOUS | Status: DC
  Administered 2016-09-05: 01:00:00 via INTRAVENOUS

## 2016-09-04 MED ORDER — GABAPENTIN 400 MG PO CAPS
400.0000 mg | ORAL_CAPSULE | Freq: Three times a day (TID) | ORAL | Status: DC
  Administered 2016-09-05 – 2016-09-10 (×14): 400 mg via ORAL
  Filled 2016-09-04 (×16): qty 1

## 2016-09-04 MED ORDER — ONDANSETRON HCL 4 MG/2ML IJ SOLN: 4.0000 mg | Freq: Four times a day (QID) | INTRAMUSCULAR | Status: DC | PRN

## 2016-09-04 NOTE — ED Provider Notes (Signed)
[facility] Provider Note   CSN: [id number] Arrival date & time: 09/04/16  1807     History   Chief Complaint Chief Complaint  Patient presents with  . Abdominal Pain    HPI Joseph MiniumDavid Bowen is a 71 y.o. male.  He presents for evaluation of abdominal pain and right scrotal swelling.  He states the swelling in his scrotum started today and the abdominal pain started last week.  He is a relatively poor historian.  He presents by EMS for evaluation from his group home.  He denies nausea vomiting fever or chills.  He was apparently seen last week, at an outlying hospital, but the patient thinks that he was seen today at that facility.  There are no other known modifying factors.  HPI  Past Medical History:  Diagnosis Date  . Anemia   . Anxiety   . Benign prostatic hypertrophy   . Chronic pancreatitis (HCC)   . COPD (chronic obstructive pulmonary disease) (HCC)   . Dementia   . Dementia    due to encephalopathy from diabetic coma 2011.  . Depression   . 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  . Essential hypertension   . Euthyroid sick syndrome   . Glaucoma   . Hypoglycemia   . Personal history of alcoholism (HCC)   . Post traumatic stress disorder   . Psychosis   . Retinopathy   . Type 2 diabetes mellitus (HCC)   . Vitamin B deficiency   . Vitamin D deficiency     Patient Active Problem List   Diagnosis Date Noted  . Left cervical radiculopathy 07/26/2016  . Closed head injury 10/20/2015  . Fall 10/20/2015  . Heme + stool 07/27/2015  . 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 Memory loss 10/31/2014  . Dizziness 07/04/2014  . Syncope 07/04/2014  . Type 2 diabetes mellitus (HCC) 07/04/2014  . Fracture, pelvis closed (HCC) 12/01/2013  . Proximal phalanx fracture of finger 12/01/2013  . Fx metacarpal neck-closed 12/01/2013  . Pelvis fracture (HCC) 11/10/2012  . Pelvic fracture (HCC) 09/23/2012  . Dislocation of PIP joint of finger  09/23/2012  . Dupuytren's disease of palm 09/02/2012    Past Surgical History:  Procedure Laterality Date  . BACK SURGERY     removal of melanoma of back.  . COLONOSCOPY WITH PROPOFOL N/A 08/15/2015   Procedure: COLONOSCOPY WITH PROPOFOL;  Surgeon: West BaliSandi L Fields, MD;  Location: [facility];  Service: Endoscopy;  Laterality: N/A;  1215pm  . HAND SURGERY Right    middle finger; fractue  . HEMORRHOID SURGERY    . HERNIA REPAIR Left    inguinal  . HIP SURGERY Bilateral    bilateral hip fractures  . MOHS SURGERY     Melanoma       Home Medications    Prior to Admission medications   Medication Sig Start Date End Date Taking? Authorizing Provider  aspirin EC 81 MG tablet Take 81 mg by mouth daily.   Yes Historical Provider, MD  bisacodyl (DULCOLAX) 10 MG suppository Place 10 mg rectally as needed for moderate constipation.   Yes Historical Provider, MD  Dextromethorphan-Quinidine (NUEDEXTA) 20-10 MG CAPS Take 1 capsule by mouth 2 (two) times daily.    Yes Historical Provider, MD  docusate sodium (COLACE) 100 MG capsule Take 100 mg by mouth daily as needed for mild constipation.   Yes Historical Provider, MD  fludrocortisone (FLORINEF) 0.1 MG tablet Take 1 tablet (0.1 mg total) by  mouth daily. 08/15/16  Yes Jonelle Sidle, MD  gabapentin (NEURONTIN) 400 MG capsule Take 400 mg by mouth 3 (three) times daily.   Yes Historical Provider, MD  guaifenesin (ROBITUSSIN) 100 MG/5ML syrup Take 200 mg by mouth every 4 (four) hours as needed for cough.   Yes Historical Provider, MD  insulin glargine (LANTUS) 100 UNIT/ML injection Inject 18 Units into the skin daily.    Yes Historical Provider, MD  insulin lispro (HUMALOG) 100 UNIT/ML injection Inject 3 Units into the skin 3 (three) times daily with meals. If blood sugar is >200.   Yes Historical Provider, MD  levothyroxine (SYNTHROID, LEVOTHROID) 75 MCG tablet Take 75 mcg by mouth daily before breakfast.   Yes Historical Provider, MD    lipase/protease/amylase (CREON-10/PANCREASE) 12000 UNITS CPEP Take 3 capsules by mouth 3 (three) times daily with meals. **Take 30 minutes before meals**   Yes Historical Provider, MD  LORazepam (ATIVAN) 0.5 MG tablet Take one tablet by mouth three times daily; Take one tablet by mouth twice daily as needed for breakthrough anxiety Patient taking differently: Take 0.5 mg by mouth 3 (three) times daily.  05/03/13  Yes Sharon Seller, NP  Melatonin 3 MG TABS Take 3 mg by mouth at bedtime.   Yes Historical Provider, MD  metFORMIN (GLUCOPHAGE) 1000 MG tablet Take 1,000 mg by mouth 2 (two) times daily with a meal.   Yes Historical Provider, MD  OLANZapine (ZYPREXA) 5 MG tablet Take 5 mg by mouth at bedtime.   Yes Historical Provider, MD  prazosin (MINIPRESS) 2 MG capsule Take 2 mg by mouth at bedtime.   Yes Historical Provider, MD  Propylene Glycol (SYSTANE BALANCE) 0.6 % SOLN Place 1 drop into both eyes at bedtime.   Yes Historical Provider, MD  rosuvastatin (CRESTOR) 10 MG tablet Take 10 mg by mouth daily.   Yes Historical Provider, MD  sennosides-docusate sodium (SENOKOT-S) 8.6-50 MG tablet Take 1 tablet by mouth 2 (two) times daily.   Yes Historical Provider, MD  sertraline (ZOLOFT) 25 MG tablet Take 25 mg by mouth daily.   Yes Historical Provider, MD  sitaGLIPtin (JANUVIA) 50 MG tablet Take 50 mg by mouth daily.   Yes Historical Provider, MD  thiamine 100 MG tablet Take 100 mg by mouth daily.   Yes Historical Provider, MD  traMADol (ULTRAM) 50 MG tablet Take 25 mg by mouth every 8 (eight) hours as needed for moderate pain.    Yes Historical Provider, MD  venlafaxine XR (EFFEXOR-XR) 150 MG 24 hr capsule Take 150 mg by mouth daily with breakfast.   Yes Historical Provider, MD    Family History Family History  Problem Relation Age of Onset  . Heart disease    . Diabetes      Social History Social History  Substance Use Topics  . Smoking status: Current Some Day Smoker    Packs/day: 1.50     Years: 48.00    Types: Cigarettes    Start date: 09/17/1961    Last attempt to quit: 09/17/2009  . Smokeless tobacco: Never Used  . Alcohol use No     Comment: hx of alcoholism-last used 2011     Allergies   Patient has no known allergies.   Review of Systems Review of Systems  All other systems reviewed and are negative.    Physical Exam Updated Vital Signs BP 152/82   Pulse 94   Temp 99.3 F (37.4 C) (Oral)   Resp 16   Ht 5'  8" (1.727 m)   Wt 152 lb (68.9 kg)   SpO2 93%   BMI 23.11 kg/m   Physical Exam  Constitutional: He is oriented to person, place, and time. He appears well-developed and well-nourished.  HENT:  Head: Normocephalic and atraumatic.  Right Ear: External ear normal.  Left Ear: External ear normal.  Eyes: Conjunctivae and EOM are normal. Pupils are equal, round, and reactive to light.  Neck: Normal range of motion and phonation normal. Neck supple.  Cardiovascular: Normal rate, regular rhythm and normal heart sounds.   Pulmonary/Chest: Effort normal and breath sounds normal. He exhibits no bony tenderness.  Abdominal: Soft. He exhibits distension. There is tenderness (Diffuse, mild). There is no rebound and no guarding.  Genitourinary:  Genitourinary Comments: Normal penis.  No urethral discharge.  Right testicle enlarged, 2-1/2-3 times normal size and very tender.  Left testicle normal.  No other palpable scrotal abnormality.  No redness or swelling of the scrotal sac.  Musculoskeletal: Normal range of motion.  Neurological: He is alert and oriented to person, place, and time. No cranial nerve deficit or sensory deficit. He exhibits normal muscle tone. Coordination normal.  Skin: Skin is warm, dry and intact.  Psychiatric: He has a normal mood and affect. His behavior is normal. Judgment and thought content normal.  Nursing note and vitals reviewed.    ED Treatments / Results  Labs (all labs ordered are listed, but only abnormal results are  displayed) Labs Reviewed  COMPREHENSIVE METABOLIC PANEL - Abnormal; Notable for the following:       Result Value   Potassium 3.0 (*)    Glucose, Bld 61 (*)    Calcium 8.5 (*)    AST 45 (*)    All other components within normal limits  CBC WITH DIFFERENTIAL/PLATELET - Abnormal; Notable for the following:    Lymphs Abs 0.5 (*)    All other components within normal limits  URINALYSIS, ROUTINE W REFLEX MICROSCOPIC - Abnormal; Notable for the following:    Ketones, ur 5 (*)    All other components within normal limits    EKG  EKG Interpretation None       Radiology US Scrotum  Result Date: 09/04/2016 CLINICAL DATA:  Evaluate for mass or testicular torsion. Scrotal swelling. EXAM: ULTRASOUND OF SCROTUM TECHNIQUE: Complete ultrasound examination of the testicles, epididymis, and other scrotal structures was performed. COMPARISON:  None. FINDINGS: Right testicle Measurements: 3.9 x 1.5 x 1.5 cm. No mass or microlithiasis visualized. Left testicle Measurements: 4.5 x 2.3 x 3.0 cm. No mass or microlithiasis visualized. Right epididymis:  6 mm epididymal cyst noted. Left epididymis:  Normal in size and appearance. Hydrocele:  Large right hydrocele.  Small left hydrocele identified. Varicocele:  None visualized. IMPRESSION: 1. No evidence for testicular mass or torsion. 2. Bilateral hydroceles, right greater than left. 3. Atrophy of the right testis. Electronically Signed   By: Signa Kell M.D.   On: 09/04/2016 20:09   Ct Abdomen Pelvis W Contrast  Result Date: 09/04/2016 CLINICAL DATA:  71 y/o  M; right testicle pain and diarrhea. EXAM: CT ABDOMEN AND PELVIS WITH CONTRAST TECHNIQUE: Multidetector CT imaging of the abdomen and pelvis was performed using the standard protocol following bolus administration of intravenous contrast. CONTRAST:  ISOVUE-300 IOPAMIDOL (ISOVUE-300) INJECTION 61% COMPARISON:  06/02/2011 CT abdomen and pelvis. 08/08/2010 CT of the chest. FINDINGS: Lower chest: New  10 mm pulmonary nodule in the left upper lobe (series 8 image 4). Mild coronary artery calcification in  the LAD. Small pericardial effusion. Small right pleural effusion. Paraseptal emphysema. Hepatobiliary: No focal liver abnormality is seen. No gallstones, gallbladder wall thickening, or biliary dilatation. Pancreas: Atrophic in calcified pancreas compatible sequelae of chronic pancreatitis. Spleen: Normal in size without focal abnormality. Adrenals/Urinary Tract: Well-circumscribed fluid attenuating lesions within the left kidney measuring up to 26 mm compatible with cysts seen on prior CT of abdomen and pelvis. Normal adrenal glands. 11 mm right kidney lower pole stone. No hydronephrosis. Normal bladder. Stomach/Bowel: Diffuse moderate dilatation of the colon with extensive enhancing wall thickening compatible with acute colitis. Mild dilatation of small bowel probably represents reactive ileus. Additionally, the appendix is mildly dilated without significant wall thickening, likely representing a reactive change. No pneumoperitoneum or intraperitoneal abscess identified. Vascular/Lymphatic: Aortic atherosclerosis. No enlarged abdominal or pelvic lymph nodes. Embolization of the celiac axis. Reproductive: Prostate enlargement with a enhancing masslike component invaginating into the floor of the bladder measuring 23 x 15 x 20 mm (AP x ML x CC series 2, image 87 and series 6, image 57). Other: Small volume of peritoneal ascites. Musculoskeletal: Degenerative changes of the visible thoracic and lumbar spine most pronounced at the L5-S1 level. Mild bilateral hip osteoarthrosis with acetabular fibrocystic degeneration. Stable mild compression deformity of the L1 vertebral body. IMPRESSION: 1. Moderate to severe acute pancolitis. No evidence for perforation or abscess. 2. Small volume of ascites is likely inflammatory and related to colitis. 3. Mild distention of small bowel probably represents a reactive ileus. 4.  10 mm pulmonary nodule in left upper lobe. Scarring was present in this area on the prior CT of the chest and this may be related. Consider one of the following in 3 months for both low-risk and high-risk individuals: (a) repeat chest CT, (b) follow-up PET-CT, or (c) tissue sampling. This recommendation follows the consensus statement: Guidelines for Management of Incidental Pulmonary Nodules Detected on CT Images: From the Fleischner Society 2017; Radiology 2017; 284:228-243. 5. Small pericardial effusion. 6. Small right-sided pleural effusion. 7. Right kidney lower pole 11 mm stone. 8. Severe aortic atherosclerosis. 9. Chronic pancreatitis. 10. Prostate enlargement with an enhancing masslike component invaginating into the floor bladder measuring up to 23 mm, this may represent central gland hypertrophy or prostate cancer. Clinical correlation recommended. 11. Mild coronary artery calcification. Paraseptal emphysema of the lungs. Electronically Signed   By: Mitzi Hansen M.D.   On: 09/04/2016 20:56   [place] Art/ven Flow Abd Pelv Doppler  Result Date: 09/04/2016 CLINICAL DATA:  Evaluate for mass or testicular torsion. Scrotal swelling. EXAM: ULTRASOUND OF SCROTUM TECHNIQUE: Complete ultrasound examination of the testicles, epididymis, and other scrotal structures was performed. COMPARISON:  None. FINDINGS: Right testicle Measurements: 3.9 x 1.5 x 1.5 cm. No mass or microlithiasis visualized. Left testicle Measurements: 4.5 x 2.3 x 3.0 cm. No mass or microlithiasis visualized. Right epididymis:  6 mm epididymal cyst noted. Left epididymis:  Normal in size and appearance. Hydrocele:  Large right hydrocele.  Small left hydrocele identified. Varicocele:  None visualized. IMPRESSION: 1. No evidence for testicular mass or torsion. 2. Bilateral hydroceles, right greater than left. 3. Atrophy of the right testis. Electronically Signed   By: Signa Kell M.D.   On: 09/04/2016 20:09    Procedures Procedures  (including critical care time)  Medications Ordered in ED Medications  iopamidol (ISOVUE-300) 61 % injection (not administered)  fentaNYL (SUBLIMAZE) injection 100 mcg (100 mcg Intravenous Given 09/04/16 1934)  piperacillin-tazobactam (ZOSYN) IVPB 3.375 g (not administered)  0.9 %  sodium chloride  infusion (not administered)  sodium chloride 0.9 % bolus 500 mL (0 mLs Intravenous Stopped 09/04/16 1939)  ondansetron (ZOFRAN) injection 4 mg (4 mg Intravenous Given 09/04/16 1934)  iopamidol (ISOVUE-300) 61 % injection 100 mL (100 mLs Intravenous Contrast Given 09/04/16 2012)     Initial Impression / Assessment and Plan / ED Course  I have reviewed the triage vital signs and the nursing notes.  Pertinent labs & imaging results that were available during my care of the patient were reviewed by me and considered in my medical decision making (see chart for details).     Medications  iopamidol (ISOVUE-300) 61 % injection (not administered)  fentaNYL (SUBLIMAZE) injection 100 mcg (100 mcg Intravenous Given 09/04/16 1934)  piperacillin-tazobactam (ZOSYN) IVPB 3.375 g (not administered)  0.9 %  sodium chloride infusion (not administered)  sodium chloride 0.9 % bolus 500 mL (0 mLs Intravenous Stopped 09/04/16 1939)  ondansetron (ZOFRAN) injection 4 mg (4 mg Intravenous Given 09/04/16 1934)  iopamidol (ISOVUE-300) 61 % injection 100 mL (100 mLs Intravenous Contrast Given 09/04/16 2012)    Patient Vitals for the past 24 hrs:  BP Temp Temp src Pulse Resp SpO2 Height Weight  09/04/16 2030 152/82 - - 94 16 93 % - -  09/04/16 2000 156/89 - - 102 17 94 % - -  09/04/16 1930 178/99 - - 93 - 99 % - -  09/04/16 1900 155/92 - - 102 - 97 % - -  09/04/16 1830 158/85 - - 102 - 95 % - -  09/04/16 1815 185/98 99.3 F (37.4 C) Oral 106 18 95 % - -  09/04/16 1810 - - - - - - 5\' 8"  (1.727 m) 152 lb (68.9 kg)   Consult Hospitalist to admit- agrees   Final Clinical Impressions(s) / ED Diagnoses   Final  diagnoses:  Testicular torsion  Mass  Colitis  Hydrocele, unspecified hydrocele type   Note regarding above diagnoses-the patient had an ultrasound to rule out testicular torsion.  As caused entry of testicular torsion as a diagnosis.  The patient does not have testicular torsion.  Abdominal pain, with right scrotal swelling and pain.  I suspect the scrotal discomfort is referred pain, from pancolitis.  There is no apparent acute scrotal or testicular abnormality.  The patient is nontoxic. Multiple incidental CT abnormalities.  Doubt severe sepsis, metabolic instability or impending vascular collapse.  Nursing Notes Reviewed/ Care Coordinated Applicable Imaging Reviewed Interpretation of Laboratory Data incorporated into ED treatment   Plan: Admit    New Prescriptions New Prescriptions   No medications on file     Mancel Bale, MD 09/05/16 [phone]

## 2016-09-04 NOTE — ED Triage Notes (Signed)
Seen at [facility] this past week for abd pain. [facility]. Denies n/v. Diarrhea x4 today-denies black or bloody stools. A/o

## 2016-09-05 DIAGNOSIS — F4310 Post-traumatic stress disorder, unspecified: Secondary | ICD-10-CM

## 2016-09-05 DIAGNOSIS — N432 Other hydrocele: Secondary | ICD-10-CM

## 2016-09-05 DIAGNOSIS — J449 Chronic obstructive pulmonary disease, unspecified: Secondary | ICD-10-CM

## 2016-09-05 DIAGNOSIS — N433 Hydrocele, unspecified: Secondary | ICD-10-CM

## 2016-09-05 DIAGNOSIS — F1096 Alcohol use, unspecified with alcohol-induced persisting amnestic disorder: Secondary | ICD-10-CM

## 2016-09-05 DIAGNOSIS — E118 Type 2 diabetes mellitus with unspecified complications: Secondary | ICD-10-CM

## 2016-09-05 LAB — LACTIC ACID, PLASMA: Lactic Acid, Venous: 0.8 mmol/L (ref 0.5–1.9)

## 2016-09-05 LAB — BASIC METABOLIC PANEL
Anion gap: 8 (ref 5–15)
BUN: 12 mg/dL (ref 6–20)
CO2: 24 mmol/L (ref 22–32)
Calcium: 7.4 mg/dL — ABNORMAL LOW (ref 8.9–10.3)
Chloride: 103 mmol/L (ref 101–111)
Creatinine, Ser: 0.7 mg/dL (ref 0.61–1.24)
GFR calc Af Amer: >60 mL/min (ref >60)
GFR calc non Af Amer: >60 mL/min (ref >60)
Glucose, Bld: 144 mg/dL — ABNORMAL HIGH (ref 65–99)
Potassium: 3.3 mmol/L — ABNORMAL LOW (ref 3.5–5.1)
Sodium: 135 mmol/L (ref 135–145)

## 2016-09-05 LAB — GLUCOSE, CAPILLARY
Glucose-Capillary: 131 mg/dL — ABNORMAL HIGH (ref 65–99)
Glucose-Capillary: 56 mg/dL — ABNORMAL LOW (ref 65–99)
Glucose-Capillary: 120 mg/dL — ABNORMAL HIGH (ref 65–99)
Glucose-Capillary: 99 mg/dL (ref 65–99)
Glucose-Capillary: 123 mg/dL — ABNORMAL HIGH (ref 65–99)
Glucose-Capillary: 122 mg/dL — ABNORMAL HIGH (ref 65–99)
Glucose-Capillary: 125 mg/dL — ABNORMAL HIGH (ref 65–99)

## 2016-09-05 LAB — MRSA PCR SCREENING: MRSA by PCR: NEGATIVE

## 2016-09-05 LAB — CBC
HCT: 37.5 % — ABNORMAL LOW (ref 39.0–52.0)
Hemoglobin: 12.8 g/dL — ABNORMAL LOW (ref 13.0–17.0)
MCH: 30.1 pg (ref 26.0–34.0)
MCHC: 34.1 g/dL (ref 30.0–36.0)
MCV: 88.2 fL (ref 78.0–100.0)
Platelets: 209 10*3/uL (ref 150–400)
RBC: 4.25 MIL/uL (ref 4.22–5.81)
RDW: 13.6 % (ref 11.5–15.5)
WBC: 4.7 10*3/uL (ref 4.0–10.5)

## 2016-09-05 LAB — MAGNESIUM: Magnesium: 1.1 mg/dL — ABNORMAL LOW (ref 1.7–2.4)

## 2016-09-05 MED ORDER — LORAZEPAM 2 MG/ML IJ SOLN
0.5000 mg | Freq: Once | INTRAMUSCULAR | Status: AC
  Administered 2016-09-05: 0.5 mg via INTRAMUSCULAR
  Filled 2016-09-05: qty 1

## 2016-09-05 MED ORDER — PIPERACILLIN-TAZOBACTAM 3.375 G IVPB
3.3750 g | Freq: Three times a day (TID) | INTRAVENOUS | Status: DC
  Administered 2016-09-05 – 2016-09-07 (×5): 3.375 g via INTRAVENOUS
  Filled 2016-09-05 (×5): qty 50

## 2016-09-05 MED ORDER — BENZONATATE 100 MG PO CAPS
200.0000 mg | ORAL_CAPSULE | Freq: Three times a day (TID) | ORAL | Status: DC | PRN
  Administered 2016-09-05 – 2016-09-08 (×2): 200 mg via ORAL
  Filled 2016-09-05 (×2): qty 2

## 2016-09-05 MED ORDER — MAGNESIUM SULFATE 2 GM/50ML IV SOLN
2.0000 g | Freq: Once | INTRAVENOUS | Status: AC
  Administered 2016-09-05: 2 g via INTRAVENOUS
  Filled 2016-09-05: qty 50

## 2016-09-05 MED ORDER — MAGNESIUM SULFATE 2 GM/50ML IV SOLN
2.0000 g | Freq: Once | INTRAVENOUS | Status: DC
  Filled 2016-09-05: qty 50

## 2016-09-05 MED ORDER — KCL IN DEXTROSE-NACL 20-5-0.9 MEQ/L-%-% IV SOLN
INTRAVENOUS | Status: DC
  Administered 2016-09-05 – 2016-09-10 (×9): via INTRAVENOUS

## 2016-09-05 MED ORDER — PIPERACILLIN-TAZOBACTAM 3.375 G IVPB
3.3750 g | Freq: Once | INTRAVENOUS | Status: AC
  Administered 2016-09-05: 3.375 g via INTRAVENOUS
  Filled 2016-09-05: qty 50

## 2016-09-05 MED ORDER — LORAZEPAM 2 MG/ML IJ SOLN
0.5000 mg | Freq: Four times a day (QID) | INTRAMUSCULAR | Status: DC | PRN
  Administered 2016-09-06: 0.5 mg via INTRAMUSCULAR
  Filled 2016-09-05 (×4): qty 1

## 2016-09-05 NOTE — Progress Notes (Signed)
PROGRESS NOTE    Joseph Bowen  ZOX:[id number] DOB: 08/08/1945 DOA: 09/04/2016 PCP: Colon BranchQURESHI, AYYAZ, MD    Brief Narrative:  Joseph Bowen is a 71 y.o. male with medical history significant of PTSD, COPD, etoh abuse, dementia, pancreatitis comes in from home (pt says he lives alone, per EDP he came from group home) with over one day of abdominal pain with no n/v with some diarrhea.  No fevers.  He also c/o right scrotal pain.  Denies any urinary symptoms.   He denies drinking etoh regularly.  Pt found to have pancolitis and referred for admission for his infection.  He denies any recent abx.   Assessment & Plan:   Principal Problem:   Colitis Active Problems:   Type 2 diabetes mellitus (HCC)   Wernicke-Korsakoff syndrome (alcoholic) (HCC)   Dementia without behavioral disturbance   Personal history of alcoholism (HCC)   Post traumatic stress disorder   Chronic obstructive pulmonary disease (HCC)   Hydrocele   Colitis - continue iv zosyn - one episodes of slightly loose stool - patient's guardian [facility] - Keep npo x ice /meds - slight tenderness with deep palpation  Type 2 diabetes mellitus (HCC) - SSI - when able to take PO will place on carb modified diet  Wernicke-Korsakoff syndrome (alcoholic) (HCC) - noted  Dementia - noted - need to avoid opiates per patient's guardian as this makes him violent  Personal history of alcoholism (HCC) - denied usage at this time - Alcohol level <5 - UDS positive for benzos which patient chronically takes  Post traumatic stress disorder - noted - will avoid opiates as this tends to aggrevate PTSD per his guardian   COPD (chronic obstructive pulmonary disease) (HCC) - stable and compensated at this time   Bilateral hydroceles  - imaging neg for any torsion - Patient declined examination today - discussed with his guardian that he may need urology eval at some point   Continue all home meds.     DVT prophylaxis: scds  Code Status:  full Family Communication: discussed with patient's legal guardian [name] Disposition Plan:  Per day team    Consultants:   None  Procedures:   None  Antimicrobials:   Zosyn 2/21>    Subjective: Patient was very agitated this am and took out his own IV and was aggressive with staff.  Discussed with his guardian and patient occasionally will act out when he takes opiates.  She states he is never given opiates outpatient because his reaction is similar to what was experienced this am.  He received a dose of IM ativan and since that time has been quiet and compliant and not aggressive.  Objective: Vitals:   09/04/16 2230 09/04/16 2322 09/04/16 2350 09/05/16 0521  BP: 157/98 156/82  132/77  Pulse: 106 100  (!) 105  Resp: 16 17 16 16   Temp:   99.3 F (37.4 C) 99.1 F (37.3 C)  TempSrc:   Oral Oral  SpO2: 91% 93% 94% 90%  Weight:   67.4 kg (148 lb 11.2 oz) 68.6 kg (151 lb 4.8 oz)  Height:   5\' 8"  (1.727 m)     Intake/Output Summary (Last 24 hours) at 09/05/16 0830 Last data filed at 09/05/16 0500  Gross per 24 hour  Intake          1096.66 ml  Output              150 ml  Net  946.66 ml   Filed Weights   09/04/16 1810 09/04/16 2350 09/05/16 0521  Weight: 68.9 kg (152 lb) 67.4 kg (148 lb 11.2 oz) 68.6 kg (151 lb 4.8 oz)    Examination:  General exam: Appears calm and comfortable  Respiratory system: Clear to auscultation. Respiratory effort normal. Cardiovascular system: S1 & S2 heard, RRR. No JVD, murmurs, rubs, gallops or clicks. No pedal edema. Gastrointestinal system: Abdomen is nondistended, soft and minimally tender in the left upper quadrant. No organomegaly or masses felt. Normal bowel sounds heard. Central nervous system: Alert and oriented. No focal neurological deficits. Extremities: Symmetric 5 x 5 power. Skin: No rashes, lesions or ulcers Psychiatry: mood & affect  appropriate.     Data Reviewed: I have personally reviewed following labs and imaging studies  CBC:  Recent Labs Lab 09/04/16 1853 09/05/16 0521  WBC 5.8 4.7  NEUTROABS 4.8  --   HGB 13.5 12.8*  HCT 40.8 37.5*  MCV 89.1 88.2  PLT 242 209   Basic Metabolic Panel:  Recent Labs Lab 09/03/16 2203 09/04/16 1853 09/05/16 0521  NA  --  139 135  K  --  3.0* 3.3*  CL  --  101 103  CO2  --  28 24  GLUCOSE  --  61* 144*  BUN  --  14 12  CREATININE  --  0.75 0.70  CALCIUM  --  8.5* 7.4*  MG 1.1*  --   --    GFR: Estimated Creatinine Clearance: 84.3 mL/min (by C-G formula based on SCr of 0.7 mg/dL). Liver Function Tests:  Recent Labs Lab 09/04/16 1853  AST 45*  ALT 18  ALKPHOS 75  BILITOT 0.6  PROT 6.5  ALBUMIN 3.5    Recent Labs Lab 09/04/16 2203  LIPASE <10*   No results for input(s): AMMONIA in the last 168 hours. Coagulation Profile: No results for input(s): INR, PROTIME in the last 168 hours. Cardiac Enzymes: No results for input(s): CKTOTAL, CKMB, CKMBINDEX, TROPONINI in the last 168 hours. BNP (last 3 results) No results for input(s): PROBNP in the last 8760 hours. HbA1C: No results for input(s): HGBA1C in the last 72 hours. CBG:  Recent Labs Lab 09/04/16 2219 09/04/16 2333 09/05/16 0333 09/05/16 0405 09/05/16 0734  GLUCAP 45* 104* 56* 131* 120*   Lipid Profile: No results for input(s): CHOL, HDL, LDLCALC, TRIG, CHOLHDL, LDLDIRECT in the last 72 hours. Thyroid Function Tests: No results for input(s): TSH, T4TOTAL, FREET4, T3FREE, THYROIDAB in the last 72 hours. Anemia Panel: No results for input(s): VITAMINB12, FOLATE, FERRITIN, TIBC, IRON, RETICCTPCT in the last 72 hours. Sepsis Labs:  Recent Labs Lab 09/04/16 2231 09/05/16 0037  LATICACIDVEN 1.0 0.8    Recent Results (from the past 240 hour(s))  MRSA PCR Screening     Status: None   Collection Time: 09/05/16 12:55 AM  Result Value Ref Range Status   MRSA by PCR NEGATIVE  NEGATIVE Final    Comment:        The GeneXpert MRSA Assay (FDA approved for NASAL specimens only), is one component of a comprehensive MRSA colonization surveillance program. It is not intended to diagnose MRSA infection nor to guide or monitor treatment for MRSA infections.          Radiology Studies: US Scrotum  Result Date: 09/04/2016 CLINICAL DATA:  Evaluate for mass or testicular torsion. Scrotal swelling. EXAM: ULTRASOUND OF SCROTUM TECHNIQUE: Complete ultrasound examination of the testicles, epididymis, and other scrotal structures was performed. COMPARISON:  None. FINDINGS:  Right testicle Measurements: 3.9 x 1.5 x 1.5 cm. No mass or microlithiasis visualized. Left testicle Measurements: 4.5 x 2.3 x 3.0 cm. No mass or microlithiasis visualized. Right epididymis:  6 mm epididymal cyst noted. Left epididymis:  Normal in size and appearance. Hydrocele:  Large right hydrocele.  Small left hydrocele identified. Varicocele:  None visualized. IMPRESSION: 1. No evidence for testicular mass or torsion. 2. Bilateral hydroceles, right greater than left. 3. Atrophy of the right testis. Electronically Signed   By: Signa Kell M.D.   On: 09/04/2016 20:09   Ct Abdomen Pelvis W Contrast  Result Date: 09/04/2016 CLINICAL DATA:  71 y/o  M; right testicle pain and diarrhea. EXAM: CT ABDOMEN AND PELVIS WITH CONTRAST TECHNIQUE: Multidetector CT imaging of the abdomen and pelvis was performed using the standard protocol following bolus administration of intravenous contrast. CONTRAST:  ISOVUE-300 IOPAMIDOL (ISOVUE-300) INJECTION 61% COMPARISON:  06/02/2011 CT abdomen and pelvis. 08/08/2010 CT of the chest. FINDINGS: Lower chest: New 10 mm pulmonary nodule in the left upper lobe (series 8 image 4). Mild coronary artery calcification in the LAD. Small pericardial effusion. Small right pleural effusion. Paraseptal emphysema. Hepatobiliary: No focal liver abnormality is seen. No gallstones,  gallbladder wall thickening, or biliary dilatation. Pancreas: Atrophic in calcified pancreas compatible sequelae of chronic pancreatitis. Spleen: Normal in size without focal abnormality. Adrenals/Urinary Tract: Well-circumscribed fluid attenuating lesions within the left kidney measuring up to 26 mm compatible with cysts seen on prior CT of abdomen and pelvis. Normal adrenal glands. 11 mm right kidney lower pole stone. No hydronephrosis. Normal bladder. Stomach/Bowel: Diffuse moderate dilatation of the colon with extensive enhancing wall thickening compatible with acute colitis. Mild dilatation of small bowel probably represents reactive ileus. Additionally, the appendix is mildly dilated without significant wall thickening, likely representing a reactive change. No pneumoperitoneum or intraperitoneal abscess identified. Vascular/Lymphatic: Aortic atherosclerosis. No enlarged abdominal or pelvic lymph nodes. Embolization of the celiac axis. Reproductive: Prostate enlargement with a enhancing masslike component invaginating into the floor of the bladder measuring 23 x 15 x 20 mm (AP x ML x CC series 2, image 87 and series 6, image 57). Other: Small volume of peritoneal ascites. Musculoskeletal: Degenerative changes of the visible thoracic and lumbar spine most pronounced at the L5-S1 level. Mild bilateral hip osteoarthrosis with acetabular fibrocystic degeneration. Stable mild compression deformity of the L1 vertebral body. IMPRESSION: 1. Moderate to severe acute pancolitis. No evidence for perforation or abscess. 2. Small volume of ascites is likely inflammatory and related to colitis. 3. Mild distention of small bowel probably represents a reactive ileus. 4. 10 mm pulmonary nodule in left upper lobe. Scarring was present in this area on the prior CT of the chest and this may be related. Consider one of the following in 3 months for both low-risk and high-risk individuals: (a) repeat chest CT, (b) follow-up PET-CT,  or (c) tissue sampling. This recommendation follows the consensus statement: Guidelines for Management of Incidental Pulmonary Nodules Detected on CT Images: From the Fleischner Society 2017; Radiology 2017; 284:228-243. 5. Small pericardial effusion. 6. Small right-sided pleural effusion. 7. Right kidney lower pole 11 mm stone. 8. Severe aortic atherosclerosis. 9. Chronic pancreatitis. 10. Prostate enlargement with an enhancing masslike component invaginating into the floor bladder measuring up to 23 mm, this may represent central gland hypertrophy or prostate cancer. Clinical correlation recommended. 11. Mild coronary artery calcification. Paraseptal emphysema of the lungs. Electronically Signed   By: Mitzi Hansen M.D.   On: 09/04/2016 20:56  [place] Art/ven Flow Abd Pelv Doppler  Result Date: 09/04/2016 CLINICAL DATA:  Evaluate for mass or testicular torsion. Scrotal swelling. EXAM: ULTRASOUND OF SCROTUM TECHNIQUE: Complete ultrasound examination of the testicles, epididymis, and other scrotal structures was performed. COMPARISON:  None. FINDINGS: Right testicle Measurements: 3.9 x 1.5 x 1.5 cm. No mass or microlithiasis visualized. Left testicle Measurements: 4.5 x 2.3 x 3.0 cm. No mass or microlithiasis visualized. Right epididymis:  6 mm epididymal cyst noted. Left epididymis:  Normal in size and appearance. Hydrocele:  Large right hydrocele.  Small left hydrocele identified. Varicocele:  None visualized. IMPRESSION: 1. No evidence for testicular mass or torsion. 2. Bilateral hydroceles, right greater than left. 3. Atrophy of the right testis. Electronically Signed   By: Signa Kell M.D.   On: 09/04/2016 20:09        Scheduled Meds: . sodium chloride   Intravenous STAT  . enoxaparin (LOVENOX) injection  40 mg Subcutaneous Q24H  . gabapentin  400 mg Oral TID  . insulin aspart  0-9 Units Subcutaneous Q4H  . lipase/protease/amylase  36,000 Units Oral TID WC  . LORazepam  0.5 mg Oral  TID  . OLANZapine  5 mg Oral QHS  . piperacillin-tazobactam (ZOSYN)  IV  3.375 g Intravenous Once  . piperacillin-tazobactam (ZOSYN)  IV  3.375 g Intravenous Q8H  . sertraline  25 mg Oral Daily  . thiamine  100 mg Oral Daily  . venlafaxine XR  150 mg Oral Q breakfast   Continuous Infusions: . dextrose 5 % and 0.9 % NaCl with KCl 20 mEq/L 100 mL/hr at 09/05/16 0352     LOS: 1 day    Time spent: 35 minutes    Katrinka Blazing, MD [facility] Pager [phone]  If 7PM-7AM, please contact night-coverage [email or web address] Password [email or web address] 09/05/2016, 8:30 AM

## 2016-09-05 NOTE — Clinical Social Work Note (Signed)
Clinical Social Work Assessment  Patient Details  Name: Joseph Bowen MRN: [id number] Date of Birth: 04/08/1946  Date of referral:  09/05/16               Reason for consult:  Discharge Planning                Permission sought to share information with:    Permission granted to share information::     Name::        Agency::     Relationship::     Contact Information:     Housing/Transportation Living arrangements for Joseph past 2 months:  Skilled Nursing Facility Source of Information:  Facility Patient Interpreter Needed:  None Criminal Activity/Legal Involvement Pertinent to Current Situation/Hospitalization:  No - Comment as needed Significant Relationships:  Siblings Lives with:  Facility Resident Do you feel safe going back to [facility] where you live?  Yes Need for family participation in patient care:  No (Coment)  Care giving concerns:  None reported. [facility]    Social Worker assessment / plan:  CSW left voicemail for pt's [facility] Bowen guardian, [facility] Bowen requesting return call. Per Joseph Bowen at facility, pt has been resident at [facility] since 2012. He has dementia and PTSD. At baseline, pt ambulates with a walker. He is nursing level of care and okay to return. Per hospital staff, pt has been somewhat aggressive. CSW also spoke with [facility] at [facility] who shared that due to diagnoses, pt can be agitated but is not usually aggressive. Joseph Bowen aware and feels that it is likely due to change in environment. Awaiting return call from guardian.   Employment status:  Retired Database administratornsurance information:  Managed Medicare PT Recommendations:  Not assessed at this time Information / Referral to community resources:  Other (Comment Required) (anticipate return to [facility])  Patient/Family's Response to care:  Anticipate return to [facility] when medically stable.   Patient/Family's Understanding of and Emotional Response to Diagnosis, Current  Treatment, and Prognosis:  Awaiting return call from guardian- [facility].   Emotional Assessment Appearance:  Appears stated age Attitude/Demeanor/Rapport:  Unable to Assess Affect (typically observed):  Unable to Assess Orientation:  Oriented to Self Alcohol / Substance use:  Not Applicable Psych involvement (Current and /or in Joseph community):  No (Comment)  Discharge Needs  Concerns to be addressed:  Discharge Planning Concerns Readmission within Joseph last 30 days:  No Current discharge risk:  Cognitively Impaired Barriers to Discharge:  Continued Medical Work up   [place] Northern Santa FeStultz, Marciel Offenberger [place], LCSW 09/05/2016, 9:37 AM [phone]

## 2016-09-05 NOTE — Progress Notes (Signed)
Pharmacy Antibiotic Note  Joseph MiniumDavid Bowen is a 71 y.o. male admitted on 09/04/2016 with intra-abdominal infection.  Pharmacy has been consulted for Zosyn dosing.  Plan: Zosyn 3.375gm IV q8h EID, infused over 4 hours F/U cxs and clinical progress  Height: 5\' 8"  (172.7 cm) Weight: 151 lb 4.8 oz (68.6 kg) IBW/kg (Calculated) : 68.4  Temp (24hrs), Avg:99.2 F (37.3 C), Min:99.1 F (37.3 C), Max:99.3 F (37.4 C)   Recent Labs Lab 09/04/16 1853 09/04/16 2231 09/05/16 0037 09/05/16 0521  WBC 5.8  --   --  4.7  CREATININE 0.75  --   --  0.70  LATICACIDVEN  --  1.0 0.8  --     Estimated Creatinine Clearance: 84.3 mL/min (by C-G formula based on SCr of 0.7 mg/dL).    No Known Allergies  Antimicrobials this admission: Zosyn 2/22 >>   Dose adjustments this admission: N/A  Microbiology results: 2/22 MRSA PCR: neg  Thank you for allowing pharmacy to be a part of this patient's care. Joseph CyphersLorie Zoe Bowen, BS Pharm D, [place] Clinical Pharmacist Pager [phone] 09/05/2016 7:43 AM

## 2016-09-05 NOTE — NC FL2 (Signed)
[place] MEDICAID FL2 LEVEL OF CARE SCREENING TOOL     IDENTIFICATION  Patient Name: Joseph Bowen Birthdate: 02/16/1946 Sex: male Admission Date (Current Location): 09/04/2016  [place]:  [place] [id number] L Facility and Address:  [facility],  [address], [place] [id number]      Provider Number: [id number]  Attending Physician Name and Address:  Joseph SchilderAlexandria U Kadolph, MD  Relative Name and Phone Number:       Current Level of Care: Hospital Recommended Level of Care: Skilled Nursing Facility Prior Approval Number:    Date Approved/Denied:   PASRR Number: [id number]  Discharge Plan: SNF    Current Diagnoses: Patient Active Problem List   Diagnosis Date Noted  . Hydrocele   . Colitis 09/04/2016  . Dementia without behavioral disturbance   . Personal history of alcoholism (HCC)   . Post traumatic stress disorder   . Chronic obstructive pulmonary disease (HCC)   . Left cervical radiculopathy 07/26/2016  . Closed head injury 10/20/2015  . Fall 10/20/2015  . Heme + stool 07/27/2015  . 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 Memory loss 10/31/2014  . Dizziness 07/04/2014  . Syncope 07/04/2014  . Type 2 diabetes mellitus (HCC) 07/04/2014  . Fracture, pelvis closed (HCC) 12/01/2013  . Proximal phalanx fracture of finger 12/01/2013  . Fx metacarpal neck-closed 12/01/2013  . Pelvis fracture (HCC) 11/10/2012  . Pelvic fracture (HCC) 09/23/2012  . Dislocation of PIP joint of finger 09/23/2012  . Dupuytren's disease of palm 09/02/2012    Orientation RESPIRATION BLADDER Height & Weight     Self  Normal Continent Weight: 151 lb 4.8 oz (68.6 kg) Height:  5\' 8"  (172.7 cm)  BEHAVIORAL SYMPTOMS/MOOD NEUROLOGICAL BOWEL NUTRITION STATUS     (n/a) Continent Diet (NPO time specified. See d/c summary for updates.)  AMBULATORY STATUS COMMUNICATION OF NEEDS Skin   Extensive Assist Verbally Bruising                        Personal Care Assistance Level of Assistance  Bathing, Dressing, Feeding Bathing Assistance: Maximum assistance Feeding assistance: Limited assistance Dressing Assistance: Maximum assistance     Functional Limitations Info  Sight, Hearing, Speech Sight Info: Adequate Hearing Info: Adequate Speech Info: Adequate    SPECIAL CARE FACTORS FREQUENCY                       Contractures      Additional Factors Info  Insulin Sliding Scale, Psychotropic Code Status Info: Full code Allergies Info: No known allergies Psychotropic Info: Ativan         Current Medications (09/05/2016):  This is the current hospital active medication list Current Facility-Administered Medications  Medication Dose Route Frequency Provider Last Rate Last Dose  . 0.9 %  sodium chloride infusion   Intravenous STAT Joseph BaleElliott Wentz, MD 100 mL/hr at 09/05/16 0004    . benzonatate (TESSALON) capsule 200 mg  200 mg Oral TID PRN Joseph GauzeKaren J Kirby-Graham, NP   200 mg at 09/05/16 0043  . dextrose 5 % and 0.9 % NaCl with KCl 20 mEq/L infusion   Intravenous Continuous Joseph Monicaachal A Erinn, MD 100 mL/hr at 09/05/16 0352    . enoxaparin (LOVENOX) injection 40 mg  40 mg Subcutaneous Q24H Joseph Bowen Huaavid, MD      . gabapentin (NEURONTIN) capsule 400 mg  400 mg Oral TID Joseph Monicaachal A Darrik, MD   400 mg at 09/05/16 0043  .  insulin aspart (novoLOG) injection 0-9 Units  0-9 Units Subcutaneous Q4H Joseph Monica, MD      . lipase/protease/amylase (CREON) capsule 36,000 Units  36,000 Units Oral TID WC Joseph Monica, MD      . LORazepam (ATIVAN) injection 0.5 mg  0.5 mg Intramuscular Q6H PRN Joseph Schilder, MD      . LORazepam (ATIVAN) tablet 0.5 mg  0.5 mg Oral TID Joseph Monica, MD   0.5 mg at 09/05/16 0043  . OLANZapine (ZYPREXA) tablet 5 mg  5 mg Oral QHS Joseph Monica, MD   5 mg at 09/05/16 0043  . ondansetron (ZOFRAN) tablet 4 mg  4 mg Oral Q6H PRN Joseph Monica, MD       Or  . ondansetron (ZOFRAN) injection 4 mg  4 mg  Intravenous Q6H PRN Joseph Monica, MD      . piperacillin-tazobactam (ZOSYN) IVPB 3.375 g  3.375 g Intravenous Q8H Joseph Schilder, MD      . sertraline (ZOLOFT) tablet 25 mg  25 mg Oral Daily Joseph Monica, MD      . thiamine (VITAMIN B-1) tablet 100 mg  100 mg Oral Daily Joseph Monica, MD      . venlafaxine XR (EFFEXOR-XR) 24 hr capsule 150 mg  150 mg Oral Q breakfast Joseph Monica, MD         Discharge Medications: Please see discharge summary for a list of discharge medications.  Relevant Imaging Results:  Relevant Lab Results:   Additional Information Pt has been somewhat aggressive with staff. SNF aware.   Joseph Bowen, [place] [phone]

## 2016-09-05 NOTE — Progress Notes (Signed)
ANTIBIOTIC CONSULT NOTE-Preliminary  Pharmacy Consult for Zosyn Indication: Intra-abdominal infection  No Known Allergies  Patient Measurements: Height: 5\' 8"  (172.7 cm) Weight: 148 lb 11.2 oz (67.4 kg) IBW/kg (Calculated) : 68.4  Vital Signs: Temp: 99.3 F (37.4 C) (02/21 2350) Temp Source: Oral (02/21 2350) BP: 156/82 (02/21 2322) Pulse Rate: 100 (02/21 2322)  Labs:  Recent Labs  09/04/16 1853  WBC 5.8  HGB 13.5  PLT 242  CREATININE 0.75    Estimated Creatinine Clearance: 83.2 mL/min (by C-G formula based on SCr of 0.75 mg/dL).  No results for input(s): VANCOTROUGH, VANCOPEAK, VANCORANDOM, GENTTROUGH, GENTPEAK, GENTRANDOM, TOBRATROUGH, TOBRAPEAK, TOBRARND, AMIKACINPEAK, AMIKACINTROU, AMIKACIN in the last 72 hours.   Microbiology: No results found for this or any previous visit (from the past 720 hour(s)).  Medical History: Past Medical History:  Diagnosis Date  . Anemia   . Anxiety   . Benign prostatic hypertrophy   . Chronic pancreatitis (HCC)   . COPD (chronic obstructive pulmonary disease) (HCC)   . Dementia   . Dementia    due to encephalopathy from diabetic coma 2011.  . Depression   . 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  . Essential hypertension   . Euthyroid sick syndrome   . Glaucoma   . Hypoglycemia   . Personal history of alcoholism (HCC)   . Post traumatic stress disorder   . Psychosis   . Retinopathy   . Type 2 diabetes mellitus (HCC)   . Vitamin B deficiency   . Vitamin D deficiency     Medications:  Zosyn 3.375 Gm IV x 1 dose given in the ED  Assessment: 71 yo male seen in the ED for abdominal pain. Pt admitted for pan colitis and IV antibiotics.  Goal of Therapy:  Eradicate infection  Plan:  Preliminary review of pertinent patient information completed.  Protocol will be initiated with a one-time dose( of Zosyn 3.375 Gm IV 8 hours after the initial dose given in the Ed  [facility] clinical pharmacist will complete  review during morning rounds to assess patient and finalize treatment regimen.  Arelia SneddonMason, Muranda Coye Anne, [facility] HospitalRPH 09/05/2016,1:01 AM

## 2016-09-05 NOTE — H&P (Signed)
History and Physical    Kale Rondeau ZOX:[id number] DOB: 03/10/1946 DOA: 09/04/2016  PCP: Colon Branch, MD  Patient coming from: home  Chief Complaint:  Abdominal pain  HPI: Joseph Bowen is a 71 y.o. male with medical history significant of PTSD, COPD, etoh abuse, dementia, pancreatitis comes in from home (pt says he lives alone, per EDP he came from group home) with over one day of abdominal pain with no n/v with some diarrhea.  No fevers.  He also c/o right scrotal pain.  Denies any urinary symptoms.   He denies drinking etoh regularly.  Pt found to have pancolitis and referred for admission for his infection.  He denies any recent abx.  Review of Systems: As per HPI otherwise 10 point review of systems negative but may be unreliable due to h/o dementia  Past Medical History:  Diagnosis Date  . Anemia   . Anxiety   . Benign prostatic hypertrophy   . Chronic pancreatitis (HCC)   . COPD (chronic obstructive pulmonary disease) (HCC)   . Dementia   . Dementia    due to encephalopathy from diabetic coma 2011.  . Depression   . 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  . Essential hypertension   . Euthyroid sick syndrome   . Glaucoma   . Hypoglycemia   . Personal history of alcoholism (HCC)   . Post traumatic stress disorder   . Psychosis   . Retinopathy   . Type 2 diabetes mellitus (HCC)   . Vitamin B deficiency   . Vitamin D deficiency     Past Surgical History:  Procedure Laterality Date  . BACK SURGERY     removal of melanoma of back.  . COLONOSCOPY WITH PROPOFOL N/A 08/15/2015   Procedure: COLONOSCOPY WITH PROPOFOL;  Surgeon: West Bali, MD;  Location: [facility];  Service: Endoscopy;  Laterality: N/A;  1215pm  . HAND SURGERY Right    middle finger; fractue  . HEMORRHOID SURGERY    . HERNIA REPAIR Left    inguinal  . HIP SURGERY Bilateral    bilateral hip fractures  . MOHS SURGERY     Melanoma     reports that he has been smoking Cigarettes.  He  started smoking about 55 years ago. He has a 72.00 pack-year smoking history. He has never used smokeless tobacco. He reports that he does not drink alcohol or use drugs.  No Known Allergies  Family History  Problem Relation Age of Onset  . Heart disease    . Diabetes      Prior to Admission medications   Medication Sig Start Date End Date Taking? Authorizing Provider  aspirin EC 81 MG tablet Take 81 mg by mouth daily.   Yes Historical Provider, MD  bisacodyl (DULCOLAX) 10 MG suppository Place 10 mg rectally as needed for moderate constipation.   Yes Historical Provider, MD  Dextromethorphan-Quinidine (NUEDEXTA) 20-10 MG CAPS Take 1 capsule by mouth 2 (two) times daily.    Yes Historical Provider, MD  docusate sodium (COLACE) 100 MG capsule Take 100 mg by mouth daily as needed for mild constipation.   Yes Historical Provider, MD  fludrocortisone (FLORINEF) 0.1 MG tablet Take 1 tablet (0.1 mg total) by mouth daily. 08/15/16  Yes Jonelle Sidle, MD  gabapentin (NEURONTIN) 400 MG capsule Take 400 mg by mouth 3 (three) times daily.   Yes Historical Provider, MD  guaifenesin (ROBITUSSIN) 100 MG/5ML syrup Take 200 mg by mouth every 4 (four)  hours as needed for cough.   Yes Historical Provider, MD  insulin glargine (LANTUS) 100 UNIT/ML injection Inject 18 Units into the skin daily.    Yes Historical Provider, MD  insulin lispro (HUMALOG) 100 UNIT/ML injection Inject 3 Units into the skin 3 (three) times daily with meals. If blood sugar is >200.   Yes Historical Provider, MD  levothyroxine (SYNTHROID, LEVOTHROID) 75 MCG tablet Take 75 mcg by mouth daily before breakfast.   Yes Historical Provider, MD  lipase/protease/amylase (CREON-10/PANCREASE) 12000 UNITS CPEP Take 3 capsules by mouth 3 (three) times daily with meals. **Take 30 minutes before meals**   Yes Historical Provider, MD  LORazepam (ATIVAN) 0.5 MG tablet Take one tablet by mouth three times daily; Take one tablet by mouth twice daily as  needed for breakthrough anxiety Patient taking differently: Take 0.5 mg by mouth 3 (three) times daily.  05/03/13  Yes Sharon Seller, NP  Melatonin 3 MG TABS Take 3 mg by mouth at bedtime.   Yes Historical Provider, MD  metFORMIN (GLUCOPHAGE) 1000 MG tablet Take 1,000 mg by mouth 2 (two) times daily with a meal.   Yes Historical Provider, MD  OLANZapine (ZYPREXA) 5 MG tablet Take 5 mg by mouth at bedtime.   Yes Historical Provider, MD  prazosin (MINIPRESS) 2 MG capsule Take 2 mg by mouth at bedtime.   Yes Historical Provider, MD  Propylene Glycol (SYSTANE BALANCE) 0.6 % SOLN Place 1 drop into both eyes at bedtime.   Yes Historical Provider, MD  rosuvastatin (CRESTOR) 10 MG tablet Take 10 mg by mouth daily.   Yes Historical Provider, MD  sennosides-docusate sodium (SENOKOT-S) 8.6-50 MG tablet Take 1 tablet by mouth 2 (two) times daily.   Yes Historical Provider, MD  sertraline (ZOLOFT) 25 MG tablet Take 25 mg by mouth daily.   Yes Historical Provider, MD  sitaGLIPtin (JANUVIA) 50 MG tablet Take 50 mg by mouth daily.   Yes Historical Provider, MD  thiamine 100 MG tablet Take 100 mg by mouth daily.   Yes Historical Provider, MD  traMADol (ULTRAM) 50 MG tablet Take 25 mg by mouth every 8 (eight) hours as needed for moderate pain.    Yes Historical Provider, MD  venlafaxine XR (EFFEXOR-XR) 150 MG 24 hr capsule Take 150 mg by mouth daily with breakfast.   Yes Historical Provider, MD    Physical Exam: Vitals:   09/04/16 2130 09/04/16 2230 09/04/16 2322 09/04/16 2350  BP: 164/84 157/98 156/82   Pulse: 103 106 100   Resp: 17 16 17 16   Temp:    99.3 F (37.4 C)  TempSrc:    Oral  SpO2: (!) 89% 91% 93% 94%  Weight:    67.4 kg (148 lb 11.2 oz)  Height:    5\' 8"  (1.727 m)    Constitutional: NAD, calm, comfortable Vitals:   09/04/16 2130 09/04/16 2230 09/04/16 2322 09/04/16 2350  BP: 164/84 157/98 156/82   Pulse: 103 106 100   Resp: 17 16 17 16   Temp:    99.3 F (37.4 C)  TempSrc:    Oral   SpO2: (!) 89% 91% 93% 94%  Weight:    67.4 kg (148 lb 11.2 oz)  Height:    5\' 8"  (1.727 m)   Eyes: PERRL, lids and conjunctivae normal ENMT: Mucous membranes are moist. Posterior pharynx clear of any exudate or lesions.Normal dentition.  Neck: normal, supple, no masses, no thyromegaly Respiratory: clear to auscultation bilaterally, no wheezing, no crackles. Normal respiratory effort.  No accessory muscle use.  Cardiovascular: Regular rate and rhythm, no murmurs / rubs / gallops. No extremity edema. 2+ pedal pulses. No carotid bruits.  Abdomen: no tenderness, no masses palpated. No hepatosplenomegaly. Bowel sounds positive.  GU:  Right testicle painful with movement, some swelling no redness, left testicle normal Musculoskeletal: no clubbing / cyanosis. No joint deformity upper and lower extremities. Good ROM, no contractures. Normal muscle tone.  Skin: no rashes, lesions, ulcers. No induration Neurologic: CN 2-12 grossly intact. Sensation intact, DTR normal. Strength 5/5 in all 4.  Psychiatric: Normal judgment and insight. Alert and oriented x 3. Normal mood.    Labs on Admission: I have personally reviewed following labs and imaging studies  CBC:  Recent Labs Lab 09/04/16 1853  WBC 5.8  NEUTROABS 4.8  HGB 13.5  HCT 40.8  MCV 89.1  PLT 242   Basic Metabolic Panel:  Recent Labs Lab 09/04/16 1853  NA 139  K 3.0*  CL 101  CO2 28  GLUCOSE 61*  BUN 14  CREATININE 0.75  CALCIUM 8.5*   GFR: Estimated Creatinine Clearance: 83.2 mL/min (by C-G formula based on SCr of 0.75 mg/dL). Liver Function Tests:  Recent Labs Lab 09/04/16 1853  AST 45*  ALT 18  ALKPHOS 75  BILITOT 0.6  PROT 6.5  ALBUMIN 3.5    Recent Labs Lab 09/04/16 2203  LIPASE <10*   CBG:  Recent Labs Lab 09/04/16 2219 09/04/16 2333  GLUCAP 45* 104*   Urine analysis:    Component Value Date/Time   COLORURINE YELLOW 09/04/2016 1843   APPEARANCEUR CLEAR 09/04/2016 1843   LABSPEC 1.021  09/04/2016 1843   PHURINE 5.0 09/04/2016 1843   GLUCOSEU NEGATIVE 09/04/2016 1843   HGBUR NEGATIVE 09/04/2016 1843   BILIRUBINUR NEGATIVE 09/04/2016 1843   KETONESUR 5 (A) 09/04/2016 1843   PROTEINUR NEGATIVE 09/04/2016 1843   UROBILINOGEN 0.2 01/22/2015 1000   NITRITE NEGATIVE 09/04/2016 1843   LEUKOCYTESUR NEGATIVE 09/04/2016 1843   Radiological Exams on Admission: US Scrotum  Result Date: 09/04/2016 CLINICAL DATA:  Evaluate for mass or testicular torsion. Scrotal swelling. EXAM: ULTRASOUND OF SCROTUM TECHNIQUE: Complete ultrasound examination of the testicles, epididymis, and other scrotal structures was performed. COMPARISON:  None. FINDINGS: Right testicle Measurements: 3.9 x 1.5 x 1.5 cm. No mass or microlithiasis visualized. Left testicle Measurements: 4.5 x 2.3 x 3.0 cm. No mass or microlithiasis visualized. Right epididymis:  6 mm epididymal cyst noted. Left epididymis:  Normal in size and appearance. Hydrocele:  Large right hydrocele.  Small left hydrocele identified. Varicocele:  None visualized. IMPRESSION: 1. No evidence for testicular mass or torsion. 2. Bilateral hydroceles, right greater than left. 3. Atrophy of the right testis. Electronically Signed   By: Signa Kell M.D.   On: 09/04/2016 20:09   Ct Abdomen Pelvis W Contrast  Result Date: 09/04/2016 CLINICAL DATA:  71 y/o  M; right testicle pain and diarrhea. EXAM: CT ABDOMEN AND PELVIS WITH CONTRAST TECHNIQUE: Multidetector CT imaging of the abdomen and pelvis was performed using the standard protocol following bolus administration of intravenous contrast. CONTRAST:  ISOVUE-300 IOPAMIDOL (ISOVUE-300) INJECTION 61% COMPARISON:  06/02/2011 CT abdomen and pelvis. 08/08/2010 CT of the chest. FINDINGS: Lower chest: New 10 mm pulmonary nodule in the left upper lobe (series 8 image 4). Mild coronary artery calcification in the LAD. Small pericardial effusion. Small right pleural effusion. Paraseptal emphysema. Hepatobiliary: No  focal liver abnormality is seen. No gallstones, gallbladder wall thickening, or biliary dilatation. Pancreas: Atrophic in calcified pancreas  compatible sequelae of chronic pancreatitis. Spleen: Normal in size without focal abnormality. Adrenals/Urinary Tract: Well-circumscribed fluid attenuating lesions within the left kidney measuring up to 26 mm compatible with cysts seen on prior CT of abdomen and pelvis. Normal adrenal glands. 11 mm right kidney lower pole stone. No hydronephrosis. Normal bladder. Stomach/Bowel: Diffuse moderate dilatation of the colon with extensive enhancing wall thickening compatible with acute colitis. Mild dilatation of small bowel probably represents reactive ileus. Additionally, the appendix is mildly dilated without significant wall thickening, likely representing a reactive change. No pneumoperitoneum or intraperitoneal abscess identified. Vascular/Lymphatic: Aortic atherosclerosis. No enlarged abdominal or pelvic lymph nodes. Embolization of the celiac axis. Reproductive: Prostate enlargement with a enhancing masslike component invaginating into the floor of the bladder measuring 23 x 15 x 20 mm (AP x ML x CC series 2, image 87 and series 6, image 57). Other: Small volume of peritoneal ascites. Musculoskeletal: Degenerative changes of the visible thoracic and lumbar spine most pronounced at the L5-S1 level. Mild bilateral hip osteoarthrosis with acetabular fibrocystic degeneration. Stable mild compression deformity of the L1 vertebral body. IMPRESSION: 1. Moderate to severe acute pancolitis. No evidence for perforation or abscess. 2. Small volume of ascites is likely inflammatory and related to colitis. 3. Mild distention of small bowel probably represents a reactive ileus. 4. 10 mm pulmonary nodule in left upper lobe. Scarring was present in this area on the prior CT of the chest and this may be related. Consider one of the following in 3 months for both low-risk and high-risk  individuals: (a) repeat chest CT, (b) follow-up PET-CT, or (c) tissue sampling. This recommendation follows the consensus statement: Guidelines for Management of Incidental Pulmonary Nodules Detected on CT Images: From the Fleischner Society 2017; Radiology 2017; 284:228-243. 5. Small pericardial effusion. 6. Small right-sided pleural effusion. 7. Right kidney lower pole 11 mm stone. 8. Severe aortic atherosclerosis. 9. Chronic pancreatitis. 10. Prostate enlargement with an enhancing masslike component invaginating into the floor bladder measuring up to 23 mm, this may represent central gland hypertrophy or prostate cancer. Clinical correlation recommended. 11. Mild coronary artery calcification. Paraseptal emphysema of the lungs. Electronically Signed   By: Mitzi Hansen M.D.   On: 09/04/2016 20:56   [place] Art/ven Flow Abd Pelv Doppler  Result Date: 09/04/2016 CLINICAL DATA:  Evaluate for mass or testicular torsion. Scrotal swelling. EXAM: ULTRASOUND OF SCROTUM TECHNIQUE: Complete ultrasound examination of the testicles, epididymis, and other scrotal structures was performed. COMPARISON:  None. FINDINGS: Right testicle Measurements: 3.9 x 1.5 x 1.5 cm. No mass or microlithiasis visualized. Left testicle Measurements: 4.5 x 2.3 x 3.0 cm. No mass or microlithiasis visualized. Right epididymis:  6 mm epididymal cyst noted. Left epididymis:  Normal in size and appearance. Hydrocele:  Large right hydrocele.  Small left hydrocele identified. Varicocele:  None visualized. IMPRESSION: 1. No evidence for testicular mass or torsion. 2. Bilateral hydroceles, right greater than left. 3. Atrophy of the right testis. Electronically Signed   By: Signa Kell M.D.   On: 09/04/2016 20:09   Old chart reviewed Case discussed with EDP Case discussed with med RN   Assessment/Plan 71 yo male with diffuse abdominal pain for a day found to have pancolitis  Principal Problem:   Colitis- place on iv zosyn.  No  diarrhea since arrival.  Ivf.  Pt was at [facility] last week for same thing, request records for review.  Keep npo x ice /meds. abd exam is benign. Lactate normal.  Active Problems:   Type  2 diabetes mellitus (HCC)- SSI   Wernicke-Korsakoff syndrome (alcoholic) (HCC)- noted   Dementia- noted, no behavioral issues at this time   Personal history of alcoholism (HCC)- denies usage at this time, ck level and uds   Post traumatic stress disorder- noted   COPD (chronic obstructive pulmonary disease) (HCC)- stable and compensated at this time   Bilateral hydroceles - imaging neg for any torsion.  May need urology eval at some point   Continue all home meds.     DVT prophylaxis: scds  Code Status:  full Family Communication: none  Disposition Plan:  Per day team Consults called:  none Admission status:  Full admit    Pt seen before midnight Darwyn,RACHAL A MD [facility]  If 7PM-7AM, please contact night-coverage [email or web address] Password [email or web address]  09/05/2016, 12:11 AM

## 2016-09-05 NOTE — Care Management Note (Signed)
Case Management Note  Patient Details  Name: Joseph Bowen MRN: [id number] Date of Birth: 10/13/1945  Subjective/Objective:          [facility]           Action/Plan: Anticipate DC back to SNF when medically stable. CSW aware of admission.   Expected Discharge Date:       09/07/2016           Expected Discharge Plan:  Skilled Nursing Facility  In-House Referral:  Clinical Social Work  Discharge planning Services     Post Acute Care Choice:  NA Choice offered to:  NA  DME Arranged:    DME Agency:     HH Arranged:    HH Agency:     Status of Service:  Completed, signed off  If discussed at [employer] Meetings, dates discussed:    Additional Comments:  Joseph Bowen, Joseph OilerSharley Diane, RN 09/05/2016, 9:57 AM

## 2016-09-06 DIAGNOSIS — F0390 Unspecified dementia without behavioral disturbance: Secondary | ICD-10-CM

## 2016-09-06 DIAGNOSIS — F1021 Alcohol dependence, in remission: Secondary | ICD-10-CM

## 2016-09-06 LAB — CBC WITH DIFFERENTIAL/PLATELET
Basophils Absolute: 0 10*3/uL (ref 0.0–0.1)
Basophils Relative: 0 %
Eosinophils Absolute: 0 10*3/uL (ref 0.0–0.7)
Eosinophils Relative: 1 %
HCT: 35.8 % — ABNORMAL LOW (ref 39.0–52.0)
Hemoglobin: 12 g/dL — ABNORMAL LOW (ref 13.0–17.0)
Lymphocytes Relative: 20 %
Lymphs Abs: 0.7 10*3/uL (ref 0.7–4.0)
MCH: 29.7 pg (ref 26.0–34.0)
MCHC: 33.5 g/dL (ref 30.0–36.0)
MCV: 88.6 fL (ref 78.0–100.0)
Monocytes Absolute: 0.6 10*3/uL (ref 0.1–1.0)
Monocytes Relative: 16 %
Neutro Abs: 2.4 10*3/uL (ref 1.7–7.7)
Neutrophils Relative %: 63 %
Platelets: 216 10*3/uL (ref 150–400)
RBC: 4.04 MIL/uL — ABNORMAL LOW (ref 4.22–5.81)
RDW: 14.2 % (ref 11.5–15.5)
WBC: 3.7 10*3/uL — ABNORMAL LOW (ref 4.0–10.5)

## 2016-09-06 LAB — GLUCOSE, CAPILLARY
Glucose-Capillary: 107 mg/dL — ABNORMAL HIGH (ref 65–99)
Glucose-Capillary: 132 mg/dL — ABNORMAL HIGH (ref 65–99)
Glucose-Capillary: 44 mg/dL — CL (ref 65–99)
Glucose-Capillary: 133 mg/dL — ABNORMAL HIGH (ref 65–99)
Glucose-Capillary: 280 mg/dL — ABNORMAL HIGH (ref 65–99)

## 2016-09-06 LAB — BASIC METABOLIC PANEL
Anion gap: 7 (ref 5–15)
BUN: 8 mg/dL (ref 6–20)
CO2: 27 mmol/L (ref 22–32)
Calcium: 7.7 mg/dL — ABNORMAL LOW (ref 8.9–10.3)
Chloride: 105 mmol/L (ref 101–111)
Creatinine, Ser: 0.66 mg/dL (ref 0.61–1.24)
GFR calc Af Amer: >60 mL/min (ref >60)
GFR calc non Af Amer: >60 mL/min (ref >60)
Glucose, Bld: 153 mg/dL — ABNORMAL HIGH (ref 65–99)
Potassium: 3.7 mmol/L (ref 3.5–5.1)
Sodium: 139 mmol/L (ref 135–145)

## 2016-09-06 LAB — MAGNESIUM: Magnesium: 1.6 mg/dL — ABNORMAL LOW (ref 1.7–2.4)

## 2016-09-06 MED ORDER — PRAZOSIN HCL 1 MG PO CAPS
2.0000 mg | ORAL_CAPSULE | Freq: Every day | ORAL | Status: DC
  Administered 2016-09-06 – 2016-09-09 (×4): 2 mg via ORAL
  Filled 2016-09-06 (×5): qty 2

## 2016-09-06 MED ORDER — GLUCOSE 40 % PO GEL
ORAL | Status: AC
  Administered 2016-09-06: 37.5 g
  Filled 2016-09-06: qty 1

## 2016-09-06 NOTE — Progress Notes (Signed)
Assessment this am,patient in bilateral wrist restraints,agitated. Introduced myself to patient. The patient was alert to self, confused to place,and time, dementia noted. I  Explained what I was going to do,and informed the patient that I would discontinue the restraints,he agree,and thank me. Patient can be impulsive at times. Restraints discontinued,and the [employer] monitor in place,and in room with patient. Will continue to monitor patient.

## 2016-09-06 NOTE — Progress Notes (Addendum)
CRITICAL VALUE ALERT  Critical value received:  CBG 44  Date of notification:  09/06/2016  Time of notification:  1709  Critical value read back:Yes.    Nurse who received alert:  Billie LadeValdese Odin Mariani  MD notified (1st page):  Dr Rinaldo RatelKadolph  Time of first page:  1713  MD notified (2nd page):  Time of second page:  Responding MD:  Dr Rinaldo RatelKadolph  Time MD responded:  [phone]

## 2016-09-06 NOTE — Progress Notes (Signed)
PROGRESS NOTE    Joseph Bowen  ZOX:[id number] DOB: January 29, 1946 DOA: 09/04/2016 PCP: Colon Branch, MD    Brief Narrative:  Joseph Bowen is a 71 y.o. male with medical history significant of PTSD, COPD, etoh abuse, dementia, pancreatitis comes in from home (pt says he lives alone, per EDP he came from group home) with over one day of abdominal pain with no n/v with some diarrhea.  No fevers.  He also c/o right scrotal pain.  Denies any urinary symptoms.   He denies drinking etoh regularly.  Pt found to have pancolitis and referred for admission for his infection.  He denies any recent abx.   Assessment & Plan:   Principal Problem:   Colitis Active Problems:   Type 2 diabetes mellitus (HCC)   Wernicke-Korsakoff syndrome (alcoholic) (HCC)   Dementia without behavioral disturbance   Personal history of alcoholism (HCC)   Post traumatic stress disorder   Chronic obstructive pulmonary disease (HCC)   Hydrocele   Colitis - continue iv zosyn - yesterday 5 episodes of stools - patient's guardian denies patient receiving antibiotics at [facility] for abdominal infection - Keep npo x ice /meds (no abdominal pain so can start clear liquid diet) - no tenderness on exam today  Tachycardia - telemetry strip has few strips incorrectly labeled atrial fibrillation - will order EKG to evaluate rate and rhythmn - at some points heart rate ~180 - draw CBC, BMP and Mg - add metoprolol IV as needed for tachycardia  Type 2 diabetes mellitus (HCC) - SSI - when able to take PO will place on carb modified diet - will start clear liquids today  Wernicke-Korsakoff syndrome (alcoholic) (HCC) - noted  Dementia - noted - need to avoid opiates per patient's guardian as this makes him violent  Personal history of alcoholism (HCC) - denied usage at this time - Alcohol level <5 - UDS positive for benzos which patient chronically takes  Post traumatic stress disorder - noted - will avoid opiates as  this tends to aggrevate PTSD per his guardian - restart prazosin   COPD (chronic obstructive pulmonary disease) (HCC) - stable and compensated at this time   Bilateral hydroceles  - imaging neg for any torsion - discussed with his guardian that he may need urology eval at some point - on physical exam today patient scrotum does not appear significant enlarged       DVT prophylaxis: scds  Code Status:  full Family Communication: SW left message for patient's guardian Disposition Plan:  pending tolerance of diet (and advancement of diet) and improvement in heart rate    Consultants:   None  Procedures:   None  Antimicrobials:   Zosyn 2/21>    Subjective: Patient asleep initially at time of exam.  Awakens and denies any pain.  Telemetry strip showing tachycardia with heart rates in 110s.  Patient denies being hungry.   Objective: Vitals:   09/04/16 2350 09/05/16 0521 09/05/16 2331 09/06/16 0549  BP:  132/77 130/68 (!) 144/75  Pulse:  (!) 105 (!) 108 (!) 111  Resp: 16 16 18 20   Temp: 99.3 F (37.4 C) 99.1 F (37.3 C) 98.9 F (37.2 C) 98.2 F (36.8 C)  TempSrc: Oral Oral Oral Oral  SpO2: 94% 90% 92% 92%  Weight: 67.4 kg (148 lb 11.2 oz) 68.6 kg (151 lb 4.8 oz)  68.5 kg (151 lb 0.2 oz)  Height: 5\' 8"  (1.727 m)       Intake/Output Summary (Last 24 hours) at 09/06/16  1251 Last data filed at 09/06/16 0900  Gross per 24 hour  Intake             2400 ml  Output                0 ml  Net             2400 ml   Filed Weights   09/04/16 2350 09/05/16 0521 09/06/16 0549  Weight: 67.4 kg (148 lb 11.2 oz) 68.6 kg (151 lb 4.8 oz) 68.5 kg (151 lb 0.2 oz)    Examination:  General exam: Appears calm and comfortable  Respiratory system: Clear to auscultation. Respiratory effort normal. Cardiovascular system: S1 & S2 heard, RRR. No JVD, murmurs, rubs, gallops or clicks. No pedal edema. Gastrointestinal system: Abdomen is nondistended, soft and nontender. No  organomegaly or masses felt. Normal bowel sounds heard. Central nervous system: sleepy but alert. No focal neurological deficits. Extremities: Symmetric 5 x 5 power. Skin: No rashes, lesions or ulcers Psychiatry: mood & affect appropriate.     Data Reviewed: I have personally reviewed following labs and imaging studies  CBC:  Recent Labs Lab 09/04/16 1853 09/05/16 0521  WBC 5.8 4.7  NEUTROABS 4.8  --   HGB 13.5 12.8*  HCT 40.8 37.5*  MCV 89.1 88.2  PLT 242 209   Basic Metabolic Panel:  Recent Labs Lab 09/03/16 2203 09/04/16 1853 09/05/16 0521  NA  --  139 135  K  --  3.0* 3.3*  CL  --  101 103  CO2  --  28 24  GLUCOSE  --  61* 144*  BUN  --  14 12  CREATININE  --  0.75 0.70  CALCIUM  --  8.5* 7.4*  MG 1.1*  --   --    GFR: Estimated Creatinine Clearance: 84.3 mL/min (by C-G formula based on SCr of 0.7 mg/dL). Liver Function Tests:  Recent Labs Lab 09/04/16 1853  AST 45*  ALT 18  ALKPHOS 75  BILITOT 0.6  PROT 6.5  ALBUMIN 3.5    Recent Labs Lab 09/04/16 2203  LIPASE <10*   No results for input(s): AMMONIA in the last 168 hours. Coagulation Profile: No results for input(s): INR, PROTIME in the last 168 hours. Cardiac Enzymes: No results for input(s): CKTOTAL, CKMB, CKMBINDEX, TROPONINI in the last 168 hours. BNP (last 3 results) No results for input(s): PROBNP in the last 8760 hours. HbA1C: No results for input(s): HGBA1C in the last 72 hours. CBG:  Recent Labs Lab 09/05/16 1117 09/05/16 1704 09/05/16 2051 09/06/16 0745 09/06/16 1131  GLUCAP 123* 122* 125* 107* 132*   Lipid Profile: No results for input(s): CHOL, HDL, LDLCALC, TRIG, CHOLHDL, LDLDIRECT in the last 72 hours. Thyroid Function Tests: No results for input(s): TSH, T4TOTAL, FREET4, T3FREE, THYROIDAB in the last 72 hours. Anemia Panel: No results for input(s): VITAMINB12, FOLATE, FERRITIN, TIBC, IRON, RETICCTPCT in the last 72 hours. Sepsis Labs:  Recent Labs Lab  09/04/16 2231 09/05/16 0037  LATICACIDVEN 1.0 0.8    Recent Results (from the past 240 hour(s))  MRSA PCR Screening     Status: None   Collection Time: 09/05/16 12:55 AM  Result Value Ref Range Status   MRSA by PCR NEGATIVE NEGATIVE Final    Comment:        The GeneXpert MRSA Assay (FDA approved for NASAL specimens only), is one component of a comprehensive MRSA colonization surveillance program. It is not intended to diagnose MRSA infection nor to  guide or monitor treatment for MRSA infections.          Radiology Studies: [place] Scrotum  Result Date: 09/04/2016 CLINICAL DATA:  Evaluate for mass or testicular torsion. Scrotal swelling. EXAM: ULTRASOUND OF SCROTUM TECHNIQUE: Complete ultrasound examination of the testicles, epididymis, and other scrotal structures was performed. COMPARISON:  None. FINDINGS: Right testicle Measurements: 3.9 x 1.5 x 1.5 cm. No mass or microlithiasis visualized. Left testicle Measurements: 4.5 x 2.3 x 3.0 cm. No mass or microlithiasis visualized. Right epididymis:  6 mm epididymal cyst noted. Left epididymis:  Normal in size and appearance. Hydrocele:  Large right hydrocele.  Small left hydrocele identified. Varicocele:  None visualized. IMPRESSION: 1. No evidence for testicular mass or torsion. 2. Bilateral hydroceles, right greater than left. 3. Atrophy of the right testis. Electronically Signed   By: Signa Kellaylor  Stroud M.D.   On: 09/04/2016 20:09   Ct Abdomen Pelvis W Contrast  Result Date: 09/04/2016 CLINICAL DATA:  71 y/o  M; right testicle pain and diarrhea. EXAM: CT ABDOMEN AND PELVIS WITH CONTRAST TECHNIQUE: Multidetector CT imaging of the abdomen and pelvis was performed using the standard protocol following bolus administration of intravenous contrast. CONTRAST:  100mL ISOVUE-300 IOPAMIDOL (ISOVUE-300) INJECTION 61% COMPARISON:  06/02/2011 CT abdomen and pelvis. 08/08/2010 CT of the chest. FINDINGS: Lower chest: New 10 mm pulmonary nodule in the left  upper lobe (series 8 image 4). Mild coronary artery calcification in the LAD. Small pericardial effusion. Small right pleural effusion. Paraseptal emphysema. Hepatobiliary: No focal liver abnormality is seen. No gallstones, gallbladder wall thickening, or biliary dilatation. Pancreas: Atrophic in calcified pancreas compatible sequelae of chronic pancreatitis. Spleen: Normal in size without focal abnormality. Adrenals/Urinary Tract: Well-circumscribed fluid attenuating lesions within the left kidney measuring up to 26 mm compatible with cysts seen on prior CT of abdomen and pelvis. Normal adrenal glands. 11 mm right kidney lower pole stone. No hydronephrosis. Normal bladder. Stomach/Bowel: Diffuse moderate dilatation of the colon with extensive enhancing wall thickening compatible with acute colitis. Mild dilatation of small bowel probably represents reactive ileus. Additionally, the appendix is mildly dilated without significant wall thickening, likely representing a reactive change. No pneumoperitoneum or intraperitoneal abscess identified. Vascular/Lymphatic: Aortic atherosclerosis. No enlarged abdominal or pelvic lymph nodes. Embolization of the celiac axis. Reproductive: Prostate enlargement with a enhancing masslike component invaginating into the floor of the bladder measuring 23 x 15 x 20 mm (AP x ML x CC series 2, image 87 and series 6, image 57). Other: Small volume of peritoneal ascites. Musculoskeletal: Degenerative changes of the visible thoracic and lumbar spine most pronounced at the L5-S1 level. Mild bilateral hip osteoarthrosis with acetabular fibrocystic degeneration. Stable mild compression deformity of the L1 vertebral body. IMPRESSION: 1. Moderate to severe acute pancolitis. No evidence for perforation or abscess. 2. Small volume of ascites is likely inflammatory and related to colitis. 3. Mild distention of small bowel probably represents a reactive ileus. 4. 10 mm pulmonary nodule in left upper  lobe. Scarring was present in this area on the prior CT of the chest and this may be related. Consider one of the following in 3 months for both low-risk and high-risk individuals: (a) repeat chest CT, (b) follow-up PET-CT, or (c) tissue sampling. This recommendation follows the consensus statement: Guidelines for Management of Incidental Pulmonary Nodules Detected on CT Images: From the Fleischner Society 2017; Radiology 2017; 284:228-243. 5. Small pericardial effusion. 6. Small right-sided pleural effusion. 7. Right kidney lower pole 11 mm stone. 8. Severe aortic atherosclerosis. 9.  Chronic pancreatitis. 10. Prostate enlargement with an enhancing masslike component invaginating into the floor bladder measuring up to 23 mm, this may represent central gland hypertrophy or prostate cancer. Clinical correlation recommended. 11. Mild coronary artery calcification. Paraseptal emphysema of the lungs. Electronically Signed   By: Mitzi Hansen M.D.   On: 09/04/2016 20:56   [place] Art/ven Flow Abd Pelv Doppler  Result Date: 09/04/2016 CLINICAL DATA:  Evaluate for mass or testicular torsion. Scrotal swelling. EXAM: ULTRASOUND OF SCROTUM TECHNIQUE: Complete ultrasound examination of the testicles, epididymis, and other scrotal structures was performed. COMPARISON:  None. FINDINGS: Right testicle Measurements: 3.9 x 1.5 x 1.5 cm. No mass or microlithiasis visualized. Left testicle Measurements: 4.5 x 2.3 x 3.0 cm. No mass or microlithiasis visualized. Right epididymis:  6 mm epididymal cyst noted. Left epididymis:  Normal in size and appearance. Hydrocele:  Large right hydrocele.  Small left hydrocele identified. Varicocele:  None visualized. IMPRESSION: 1. No evidence for testicular mass or torsion. 2. Bilateral hydroceles, right greater than left. 3. Atrophy of the right testis. Electronically Signed   By: Signa Kell M.D.   On: 09/04/2016 20:09        Scheduled Meds: . enoxaparin (LOVENOX) injection   40 mg Subcutaneous Q24H  . gabapentin  400 mg Oral TID  . insulin aspart  0-9 Units Subcutaneous Q4H  . lipase/protease/amylase  36,000 Units Oral TID WC  . LORazepam  0.5 mg Oral TID  . magnesium sulfate 1 - 4 g bolus IVPB  2 g Intravenous Once  . OLANZapine  5 mg Oral QHS  . piperacillin-tazobactam (ZOSYN)  IV  3.375 g Intravenous Q8H  . prazosin  2 mg Oral QHS  . sertraline  25 mg Oral Daily  . thiamine  100 mg Oral Daily  . venlafaxine XR  150 mg Oral Q breakfast   Continuous Infusions: . dextrose 5 % and 0.9 % NaCl with KCl 20 mEq/L 100 mL/hr at 09/06/16 0816     LOS: 2 days    Time spent: 30 minutes    Katrinka Blazing, MD [facility] Pager [phone]  If 7PM-7AM, please contact night-coverage [email or web address] Password [facility] 09/06/2016, 12:51 PM

## 2016-09-06 NOTE — Progress Notes (Signed)
Pt has been very confused and agitated tonight and very hard to redirect. He continuously got out of bed without calling and would become upset when staff would try to assist him to the bed side commode. Pt pulled IV out and hand mitts were applied after new IV access was established. IM 0.5mg  Ativan administered with little effect. Pt seemed to become more agitated. Avasist Telesitter initiated, which prompted more agitation when the telesitter would interact with pt he became beligerant. Pt got out of bed and became aggressive towards staff members, pushing and swatting at them. Security was called to the room to help contain the pt. Soft wrist restraints applied to both wrists per MD order. Pt in bed at this time. No distress noted. See restraint flowsheet for further details. Will continue to monitor. Vivi Fernslare Eliah Ozawa, BSN, RN 09/06/2016 4:35 AM

## 2016-09-06 NOTE — Care Management Important Message (Signed)
Important Message  Patient Details  Name: Peter MiniumDavid Hur MRN: [id number] Date of Birth: 09/07/1945   Medicare Important Message Given:  Yes    Malcolm MetroChildress, Varie Machamer Demske, RN 09/06/2016, 9:51 AM

## 2016-09-06 NOTE — Progress Notes (Signed)
Pt remained in restraints during night shift. Avasist telesitter monitoring pt. Pt still very agitated and verbally abusive toward staff members and also trying to kick them when trying to clean him up after an incontinent episode. Shortly after he had another episode of incontinence and refused to be cleaned up, kicking and yelling, causing the IV to become dislosdged. [facility] Day shift team currently cleaning him up.  Vivi Fernslare Pahola Dimmitt, BSN, RN 09/06/2016 7:42 AM

## 2016-09-06 NOTE — Progress Notes (Signed)
Patient placed on Enteric precaution per MD orders, patient informed,and educated, Joseph KaufmannMelissa Bowen who is the patient's guardian also notified,and educated,both verbalized understanding. Education  handouts given on enteric isolation. Will continue to monitor patient..Marland Kitchen

## 2016-09-06 NOTE — Clinical Social Work Note (Signed)
CSW updated [name] on pt. Facility aware of anticipated weekend d/c. CSW left voicemail for guardian, Joseph AldermanMelissa Price regarding information as well.   Derenda FennelKara Musab Wingard, LCSW [phone]

## 2016-09-07 LAB — GLUCOSE, CAPILLARY
Glucose-Capillary: 270 mg/dL — ABNORMAL HIGH (ref 65–99)
Glucose-Capillary: 95 mg/dL (ref 65–99)
Glucose-Capillary: 169 mg/dL — ABNORMAL HIGH (ref 65–99)
Glucose-Capillary: 290 mg/dL — ABNORMAL HIGH (ref 65–99)
Glucose-Capillary: 284 mg/dL — ABNORMAL HIGH (ref 65–99)

## 2016-09-07 LAB — GASTROINTESTINAL PANEL BY PCR, STOOL (REPLACES STOOL CULTURE)

## 2016-09-07 LAB — C DIFFICILE QUICK SCREEN W PCR REFLEX
C Diff antigen: NEGATIVE
C Diff interpretation: NOT DETECTED
C Diff toxin: NEGATIVE

## 2016-09-07 MED ORDER — DEXTROSE 5 % IV SOLN
INTRAVENOUS | Status: AC
  Filled 2016-09-07: qty 10

## 2016-09-07 MED ORDER — DEXTROSE 5 % IV SOLN
2.0000 g | INTRAVENOUS | Status: DC
  Administered 2016-09-07 – 2016-09-08 (×2): 2 g via INTRAVENOUS
  Filled 2016-09-07 (×3): qty 2

## 2016-09-07 MED ORDER — LORAZEPAM 2 MG/ML IJ SOLN
0.2500 mg | Freq: Once | INTRAMUSCULAR | Status: AC
  Administered 2016-09-07: 0.25 mg via INTRAVENOUS
  Filled 2016-09-07: qty 1

## 2016-09-07 MED ORDER — DEXTROSE 5 % IV SOLN
INTRAVENOUS | Status: AC
  Filled 2016-09-07: qty 2

## 2016-09-07 MED ORDER — METRONIDAZOLE 500 MG PO TABS
500.0000 mg | ORAL_TABLET | Freq: Three times a day (TID) | ORAL | Status: DC
  Administered 2016-09-07 – 2016-09-10 (×9): 500 mg via ORAL
  Filled 2016-09-07 (×9): qty 1

## 2016-09-07 NOTE — Progress Notes (Signed)
No meds given patient refused them all today.  He is confused.

## 2016-09-07 NOTE — Progress Notes (Signed)
PROGRESS NOTE    Joseph Bowen  WUJ:[id number] DOB: [birth year] DOA: 09/04/2016 PCP: Colon Branch, MD    Brief Narrative:  Joseph Bowen is a 71 y.o. male with medical history significant of PTSD, COPD, etoh abuse, dementia, pancreatitis comes in from home (pt says he lives alone, per EDP he came from group home) with over one day of abdominal pain with no n/v with some diarrhea.  No fevers.  He also c/o right scrotal pain.  Denies any urinary symptoms.   He denies drinking etoh regularly.  Pt found to have pancolitis and referred for admission for his infection.  He denies any recent abx.   Assessment & Plan:   Principal Problem:   Colitis Active Problems:   Type 2 diabetes mellitus (HCC)   Wernicke-Korsakoff syndrome (alcoholic) (HCC)   Dementia without behavioral disturbance   Personal history of alcoholism (HCC)   Post traumatic stress disorder   Chronic obstructive pulmonary disease (HCC)   Hydrocele   Colitis - change antibiotics to ceftriaxone and flagyl - patient's guardian denies patient receiving antibiotics at [facility] for abdominal infection - started on clear liquid diet - no tenderness on exam today  Tachycardia - telemetry strip improperly labeled as patient but belonged to another patient  Type 2 diabetes mellitus (HCC) - SSI - when able to take PO will place on carb modified diet - will start clear liquids today  Wernicke-Korsakoff syndrome (alcoholic) (HCC) - noted  Dementia - noted - need to avoid opiates per patient's guardian as this makes him violent  Personal history of alcoholism (HCC) - denied usage at this time - Alcohol level <5 - UDS positive for benzos which patient chronically takes  Post traumatic stress disorder - noted - will avoid opiates as this tends to aggrevate PTSD per his guardian - restart prazosin   COPD (chronic obstructive pulmonary disease) (HCC) - stable and compensated at this time   Bilateral hydroceles  - imaging  neg for any torsion - discussed with his guardian that he may need urology eval at some point - on physical exam today patient scrotum does not appear significant enlarged       DVT prophylaxis: scds  Code Status:  full Family Communication: Spoke with patient's guardian yesterday bedside Disposition Plan:  pending tolerance of diet (and advancement of diet)     Consultants:   None  Procedures:   None  Antimicrobials:   Zosyn 2/21> 2/24  Rocephin 2/24>  Flagyl 2/24>   Subjective: Patient agitated and combative during shift.  Required two IV ativan doses to calm him down. Still having consistent diarrhea throughout the day.     Objective: Vitals:   09/06/16 1436 09/06/16 2312 09/07/16 0500 09/07/16 1436  BP: (!) 143/80 (!) 179/98 128/61 (!) 156/89  Pulse: 85 77 83 74  Resp: 20 20 20 20   Temp: 98.3 F (36.8 C) 98.2 F (36.8 C) 98.6 F (37 C) 98.7 F (37.1 C)  TempSrc: Oral Oral Oral Oral  SpO2: 96% 96% 94% 97%  Weight:   68.3 kg (150 lb 9.2 oz)   Height:        Intake/Output Summary (Last 24 hours) at 09/07/16 1824 Last data filed at 09/07/16 0300  Gross per 24 hour  Intake             1210 ml  Output                0 ml  Net  1210 ml   Filed Weights   09/05/16 0521 09/06/16 0549 09/07/16 0500  Weight: 68.6 kg (151 lb 4.8 oz) 68.5 kg (151 lb 0.2 oz) 68.3 kg (150 lb 9.2 oz)    Examination:  General exam: Appears mildly agitated Respiratory system: Clear to auscultation. Respiratory effort normal. Cardiovascular system: S1 & S2 heard, RRR. No JVD, murmurs, rubs, gallops or clicks. No pedal edema. Gastrointestinal system: Abdomen is nondistended, soft and nontender. No organomegaly or masses felt. Normal bowel sounds heard. Central nervous system: agitated. No focal neurological deficits. Extremities: Symmetric 5 x 5 power. Skin: No rashes, lesions or ulcers Psychiatry: very agitated     Data Reviewed: I have personally reviewed  following labs and imaging studies  CBC:  Recent Labs Lab 09/04/16 1853 09/05/16 0521 09/06/16 1308  WBC 5.8 4.7 3.7*  NEUTROABS 4.8  --  2.4  HGB 13.5 12.8* 12.0*  HCT 40.8 37.5* 35.8*  MCV 89.1 88.2 88.6  PLT 242 209 216   Basic Metabolic Panel:  Recent Labs Lab 09/03/16 2203 09/04/16 1853 09/05/16 0521 09/06/16 1308  NA  --  139 135 139  K  --  3.0* 3.3* 3.7  CL  --  101 103 105  CO2  --  28 24 27   GLUCOSE  --  61* 144* 153*  BUN  --  14 12 8   CREATININE  --  0.75 0.70 0.66  CALCIUM  --  8.5* 7.4* 7.7*  MG 1.1*  --   --  1.6*   GFR: Estimated Creatinine Clearance: 84.2 mL/min (by C-G formula based on SCr of 0.66 mg/dL). Liver Function Tests:  Recent Labs Lab 09/04/16 1853  AST 45*  ALT 18  ALKPHOS 75  BILITOT 0.6  PROT 6.5  ALBUMIN 3.5    Recent Labs Lab 09/04/16 2203  LIPASE <10*   No results for input(s): AMMONIA in the last 168 hours. Coagulation Profile: No results for input(s): INR, PROTIME in the last 168 hours. Cardiac Enzymes: No results for input(s): CKTOTAL, CKMB, CKMBINDEX, TROPONINI in the last 168 hours. BNP (last 3 results) No results for input(s): PROBNP in the last 8760 hours. HbA1C: No results for input(s): HGBA1C in the last 72 hours. CBG:  Recent Labs Lab 09/06/16 2052 09/07/16 0109 09/07/16 0519 09/07/16 0751 09/07/16 1559  GLUCAP 280* 270* 95 169* 290*   Lipid Profile: No results for input(s): CHOL, HDL, LDLCALC, TRIG, CHOLHDL, LDLDIRECT in the last 72 hours. Thyroid Function Tests: No results for input(s): TSH, T4TOTAL, FREET4, T3FREE, THYROIDAB in the last 72 hours. Anemia Panel: No results for input(s): VITAMINB12, FOLATE, FERRITIN, TIBC, IRON, RETICCTPCT in the last 72 hours. Sepsis Labs:  Recent Labs Lab 09/04/16 2231 09/05/16 0037  LATICACIDVEN 1.0 0.8    Recent Results (from the past 240 hour(s))  MRSA PCR Screening     Status: None   Collection Time: 09/05/16 12:55 AM  Result Value Ref Range  Status   MRSA by PCR NEGATIVE NEGATIVE Final    Comment:        The GeneXpert MRSA Assay (FDA approved for NASAL specimens only), is one component of a comprehensive MRSA colonization surveillance program. It is not intended to diagnose MRSA infection nor to guide or monitor treatment for MRSA infections.   C difficile quick scan w PCR reflex     Status: None   Collection Time: 09/06/16  9:00 PM  Result Value Ref Range Status   C Diff antigen NEGATIVE NEGATIVE Final   C Diff  toxin NEGATIVE NEGATIVE Final   C Diff interpretation No C. difficile detected.  Final  Gastrointestinal Panel by PCR , Stool     Status: None   Collection Time: 09/06/16  9:00 PM  Result Value Ref Range Status   Campylobacter species NOT DETECTED NOT DETECTED Final   Plesimonas shigelloides NOT DETECTED NOT DETECTED Final   Salmonella species NOT DETECTED NOT DETECTED Final   Yersinia enterocolitica NOT DETECTED NOT DETECTED Final   Vibrio species NOT DETECTED NOT DETECTED Final   Vibrio cholerae NOT DETECTED NOT DETECTED Final   Enteroaggregative E coli (EAEC) NOT DETECTED NOT DETECTED Final   Enteropathogenic E coli (EPEC) NOT DETECTED NOT DETECTED Final   Enterotoxigenic E coli (ETEC) NOT DETECTED NOT DETECTED Final   Shiga like toxin producing E coli (STEC) NOT DETECTED NOT DETECTED Final   Shigella/Enteroinvasive E coli (EIEC) NOT DETECTED NOT DETECTED Final   Cryptosporidium NOT DETECTED NOT DETECTED Final   Cyclospora cayetanensis NOT DETECTED NOT DETECTED Final   Entamoeba histolytica NOT DETECTED NOT DETECTED Final   Giardia lamblia NOT DETECTED NOT DETECTED Final   Adenovirus F40/41 NOT DETECTED NOT DETECTED Final   Astrovirus NOT DETECTED NOT DETECTED Final   Norovirus GI/GII NOT DETECTED NOT DETECTED Final   Rotavirus A NOT DETECTED NOT DETECTED Final   Sapovirus (I, II, IV, and V) NOT DETECTED NOT DETECTED Final         Radiology Studies: No results found.      Scheduled  Meds: . enoxaparin (LOVENOX) injection  40 mg Subcutaneous Q24H  . gabapentin  400 mg Oral TID  . insulin aspart  0-9 Units Subcutaneous Q4H  . lipase/protease/amylase  36,000 Units Oral TID WC  . LORazepam  0.25 mg Intravenous Once  . LORazepam  0.5 mg Oral TID  . magnesium sulfate 1 - 4 g bolus IVPB  2 g Intravenous Once  . OLANZapine  5 mg Oral QHS  . piperacillin-tazobactam (ZOSYN)  IV  3.375 g Intravenous Q8H  . prazosin  2 mg Oral QHS  . sertraline  25 mg Oral Daily  . thiamine  100 mg Oral Daily  . venlafaxine XR  150 mg Oral Q breakfast   Continuous Infusions: . dextrose 5 % and 0.9 % NaCl with KCl 20 mEq/L 100 mL/hr at 09/07/16 0537     LOS: 3 days    Time spent: 30 minutes    Katrinka Blazing, MD [facility] Pager [phone]  If 7PM-7AM, please contact night-coverage [email or web address] Password [facility] 09/07/2016, 6:24 PM

## 2016-09-07 NOTE — Progress Notes (Signed)
Late Entry For 09/07/16   The patient became agitated today wanting to find his pants and leave.  He was very confused and combative.  Security, Press photographercharge nurse, house supervisor was notified.  Patient was medicated with scheduled prn med and was also ordered a one time order of ativan. He then after then calmed down and laid in the bed only jumping up impulsively when he had to have a bm which is diarrhea, and then he lays down.  Discussed with the supervisor to get the patient a sitter because he is a very high risk for falls.  She stated one would arrive at 1500.

## 2016-09-08 LAB — GLUCOSE, CAPILLARY
Glucose-Capillary: 165 mg/dL — ABNORMAL HIGH (ref 65–99)
Glucose-Capillary: 176 mg/dL — ABNORMAL HIGH (ref 65–99)
Glucose-Capillary: 198 mg/dL — ABNORMAL HIGH (ref 65–99)
Glucose-Capillary: 295 mg/dL — ABNORMAL HIGH (ref 65–99)
Glucose-Capillary: 329 mg/dL — ABNORMAL HIGH (ref 65–99)
Glucose-Capillary: 38 mg/dL — CL (ref 65–99)
Glucose-Capillary: 386 mg/dL — ABNORMAL HIGH (ref 65–99)
Glucose-Capillary: 49 mg/dL — ABNORMAL LOW (ref 65–99)

## 2016-09-08 MED ORDER — MAGNESIUM SULFATE 2 GM/50ML IV SOLN
2.0000 g | Freq: Once | INTRAVENOUS | Status: AC
  Administered 2016-09-08: 2 g via INTRAVENOUS
  Filled 2016-09-08: qty 50

## 2016-09-08 MED ORDER — GUAIFENESIN 100 MG/5ML PO SOLN
5.0000 mL | ORAL | Status: DC | PRN
  Administered 2016-09-08: 100 mg via ORAL
  Filled 2016-09-08: qty 5

## 2016-09-08 NOTE — Progress Notes (Signed)
PROGRESS NOTE    Joseph Bowen  ZOX:[id number] DOB: 12/30/1945 DOA: 09/04/2016 PCP: Colon BranchQURESHI, AYYAZ, MD    Brief Narrative:  Joseph Bowen with medical history significant of PTSD, COPD, etoh abuse, dementia, pancreatitis comes in from home (pt says he lives alone, per EDP he came from group home) with over one day of abdominal pain with no n/v with some diarrhea.  No fevers.  He also c/o right scrotal pain.  Denies any urinary symptoms.   He denies drinking etoh regularly.  Pt found to have pancolitis and referred for admission for his infection.  He denies any recent abx.   Assessment & Plan:   Principal Problem:   Colitis Active Problems:   Type 2 diabetes mellitus (HCC)   Wernicke-Korsakoff syndrome (alcoholic) (HCC)   Dementia without behavioral disturbance   Personal history of alcoholism (HCC)   Post traumatic stress disorder   Chronic obstructive pulmonary disease (HCC)   Hydrocele   Colitis - continue ceftriaxone and flagyl - patient's guardian [facility] - started on clear liquid diet - no tenderness on exam today - gastroenterology consulted- appreciate their recommendations  - C diff negative - GI pathogen panel negative  Tachycardia - telemetry strip improperly labeled as patient but belonged to another patient  Type 2 diabetes mellitus (HCC) - SSI - when able to take PO will place on carb modified diet - will start clear liquids today  Wernicke-Korsakoff syndrome (alcoholic) (HCC) - noted  Dementia - noted - need to avoid opiates per patient's guardian as this makes him violent  Personal history of alcoholism (HCC) - denied usage at this time - Alcohol level <5 - UDS positive for benzos which patient chronically takes  Post traumatic stress disorder - noted - will avoid opiates as this tends to aggrevate PTSD per his guardian - restart prazosin   COPD (chronic obstructive  pulmonary disease) (HCC) - stable and compensated at this time   Bilateral hydroceles  - imaging neg for any torsion - discussed with his guardian that he may need urology eval at some point - on physical exam today patient scrotum does not appear significant enlarged - CT scan also showed        DVT prophylaxis: scds  Code Status:  full Family Communication: no family bedside Disposition Plan:  pending tolerance of diet (and advancement of diet)     Consultants:   None  Procedures:   None  Antimicrobials:   Zosyn 2/21> 2/24  Rocephin 2/24>  Flagyl 2/24>   Subjective: Patient reports his abdominal pain is better today (his guardian states he is unable to provide accurate answers to questions).  Patient asleep at time of exam.  Has had 9 bowel movements yesterday.  He is asking if he can eat.    Objective: Vitals:   09/07/16 0500 09/07/16 1436 09/07/16 2300 09/08/16 0522  BP: 128/61 (!) 156/89 (!) 189/82 140/75  Pulse: 83 74 65 61  Resp: 20 20 20 18   Temp: 98.6 F (37 C) 98.7 F (37.1 C) 98.9 F (37.2 C) 98.5 F (36.9 C)  TempSrc: Oral Oral Oral Oral  SpO2: 94% 97% 95% 96%  Weight: 68.3 kg (150 lb 9.2 oz)     Height:        Intake/Output Summary (Last 24 hours) at 09/08/16 1134 Last data filed at 09/08/16 0820  Gross per 24 hour  Intake  360 ml  Output              600 ml  Net             -240 ml   Filed Weights   09/05/16 0521 09/06/16 0549 09/07/16 0500  Weight: 68.6 kg (151 lb 4.8 oz) 68.5 kg (151 lb 0.2 oz) 68.3 kg (150 lb 9.2 oz)    Examination:  General exam: Appears calm today- sleeping Respiratory system: Clear to auscultation. Respiratory effort normal. Cardiovascular system: S1 & S2 heard, RRR. No JVD, murmurs, rubs, gallops or clicks. No pedal edema. Gastrointestinal system: Abdomen is nondistended, soft and nontender. No organomegaly or masses felt. Normal bowel sounds heard. Central nervous system:No focal  neurological deficits. Extremities: Symmetric 5 x 5 power. Skin: No rashes, lesions or ulcers Psychiatry: calm affect today     Data Reviewed: I have personally reviewed following labs and imaging studies  CBC:  Recent Labs Lab 09/04/16 1853 09/05/16 0521 09/06/16 1308  WBC 5.8 4.7 3.7*  NEUTROABS 4.8  --  2.4  HGB 13.5 12.8* 12.0*  HCT 40.8 37.5* 35.8*  MCV 89.1 88.2 88.6  PLT 242 209 216   Basic Metabolic Panel:  Recent Labs Lab 09/03/16 2203 09/04/16 1853 09/05/16 0521 09/06/16 1308  NA  --  139 135 139  K  --  3.0* 3.3* 3.7  CL  --  101 103 105  CO2  --  28 24 27   GLUCOSE  --  61* 144* 153*  BUN  --  14 12 8   CREATININE  --  0.75 0.70 0.66  CALCIUM  --  8.5* 7.4* 7.7*  MG 1.1*  --   --  1.6*   GFR: Estimated Creatinine Clearance: 84.2 mL/min (by C-G formula based on SCr of 0.66 mg/dL). Liver Function Tests:  Recent Labs Lab 09/04/16 1853  AST 45*  ALT 18  ALKPHOS 75  BILITOT 0.6  PROT 6.5  ALBUMIN 3.5    Recent Labs Lab 09/04/16 2203  LIPASE <10*   No results for input(s): AMMONIA in the last 168 hours. Coagulation Profile: No results for input(s): INR, PROTIME in the last 168 hours. Cardiac Enzymes: No results for input(s): CKTOTAL, CKMB, CKMBINDEX, TROPONINI in the last 168 hours. BNP (last 3 results) No results for input(s): PROBNP in the last 8760 hours. HbA1C: No results for input(s): HGBA1C in the last 72 hours. CBG:  Recent Labs Lab 09/07/16 2007 09/08/16 0009 09/08/16 0515 09/08/16 0746 09/08/16 1131  GLUCAP 284* 329* 295* 165* 176*   Lipid Profile: No results for input(s): CHOL, HDL, LDLCALC, TRIG, CHOLHDL, LDLDIRECT in the last 72 hours. Thyroid Function Tests: No results for input(s): TSH, T4TOTAL, FREET4, T3FREE, THYROIDAB in the last 72 hours. Anemia Panel: No results for input(s): VITAMINB12, FOLATE, FERRITIN, TIBC, IRON, RETICCTPCT in the last 72 hours. Sepsis Labs:  Recent Labs Lab 09/04/16 2231  09/05/16 0037  LATICACIDVEN 1.0 0.8    Recent Results (from the past 240 hour(s))  MRSA PCR Screening     Status: None   Collection Time: 09/05/16 12:55 AM  Result Value Ref Range Status   MRSA by PCR NEGATIVE NEGATIVE Final    Comment:        The GeneXpert MRSA Assay (FDA approved for NASAL specimens only), is one component of a comprehensive MRSA colonization surveillance program. It is not intended to diagnose MRSA infection nor to guide or monitor treatment for MRSA infections.   C difficile quick scan w PCR  reflex     Status: None   Collection Time: 09/06/16  9:00 PM  Result Value Ref Range Status   C Diff antigen NEGATIVE NEGATIVE Final   C Diff toxin NEGATIVE NEGATIVE Final   C Diff interpretation No C. difficile detected.  Final  Gastrointestinal Panel by PCR , Stool     Status: None   Collection Time: 09/06/16  9:00 PM  Result Value Ref Range Status   Campylobacter species NOT DETECTED NOT DETECTED Final   Plesimonas shigelloides NOT DETECTED NOT DETECTED Final   Salmonella species NOT DETECTED NOT DETECTED Final   Yersinia enterocolitica NOT DETECTED NOT DETECTED Final   Vibrio species NOT DETECTED NOT DETECTED Final   Vibrio cholerae NOT DETECTED NOT DETECTED Final   Enteroaggregative E coli (EAEC) NOT DETECTED NOT DETECTED Final   Enteropathogenic E coli (EPEC) NOT DETECTED NOT DETECTED Final   Enterotoxigenic E coli (ETEC) NOT DETECTED NOT DETECTED Final   Shiga like toxin producing E coli (STEC) NOT DETECTED NOT DETECTED Final   Shigella/Enteroinvasive E coli (EIEC) NOT DETECTED NOT DETECTED Final   Cryptosporidium NOT DETECTED NOT DETECTED Final   Cyclospora cayetanensis NOT DETECTED NOT DETECTED Final   Entamoeba histolytica NOT DETECTED NOT DETECTED Final   Giardia lamblia NOT DETECTED NOT DETECTED Final   Adenovirus F40/41 NOT DETECTED NOT DETECTED Final   Astrovirus NOT DETECTED NOT DETECTED Final   Norovirus GI/GII NOT DETECTED NOT DETECTED Final    Rotavirus A NOT DETECTED NOT DETECTED Final   Sapovirus (I, II, IV, and V) NOT DETECTED NOT DETECTED Final         Radiology Studies: No results found.      Scheduled Meds: . cefTRIAXone (ROCEPHIN)  IV  2 g Intravenous Q24H  . enoxaparin (LOVENOX) injection  40 mg Subcutaneous Q24H  . gabapentin  400 mg Oral TID  . insulin aspart  0-9 Units Subcutaneous Q4H  . lipase/protease/amylase  36,000 Units Oral TID WC  . LORazepam  0.5 mg Oral TID  . magnesium sulfate 1 - 4 g bolus IVPB  2 g Intravenous Once  . metroNIDAZOLE  500 mg Oral Q8H  . OLANZapine  5 mg Oral QHS  . prazosin  2 mg Oral QHS  . sertraline  25 mg Oral Daily  . thiamine  100 mg Oral Daily  . venlafaxine XR  150 mg Oral Q breakfast   Continuous Infusions: . dextrose 5 % and 0.9 % NaCl with KCl 20 mEq/L 100 mL/hr at 09/08/16 0519     LOS: 4 days    Time spent: 30 minutes    Katrinka Blazing, MD [facility] Pager [phone]  If 7PM-7AM, please contact night-coverage [email or web address] Password [email or web address] 09/08/2016, 11:34 AM

## 2016-09-08 NOTE — Progress Notes (Signed)
Pharmacy Antibiotic Note  Joseph Bowen is a 71 y.o. male admitted on 09/04/2016 with intra-abdominal infection.  Pharmacy has been consulted for Rocephin dosing.  Plan: Rocephin 2gm IV q24hrs F/U cxs and clinical progress  Height: 5\' 8"  (172.7 cm) Weight: 150 lb 9.2 oz (68.3 kg) IBW/kg (Calculated) : 68.4  Temp (24hrs), Avg:98.7 F (37.1 C), Min:98.5 F (36.9 C), Max:98.9 F (37.2 C)   Recent Labs Lab 09/04/16 1853 09/04/16 2231 09/05/16 0037 09/05/16 0521 09/06/16 1308  WBC 5.8  --   --  4.7 3.7*  CREATININE 0.75  --   --  0.70 0.66  LATICACIDVEN  --  1.0 0.8  --   --     Estimated Creatinine Clearance: 84.2 mL/min (by C-G formula based on SCr of 0.66 mg/dL).    No Known Allergies  Antimicrobials this admission: Zosyn 2/22 >> 2/24 Rocephin 2/24 >> Flagyl 2/24 >>  Dose adjustments this admission: N/A  Microbiology results: 2/22 MRSA PCR: neg  Thank you for allowing pharmacy to be a part of this patient's care. Valrie HartScott Dailey Buccheri, PharmD Clinical Pharmacist Pager:  [phone] 09/08/2016   09/08/2016 11:09 AM

## 2016-09-09 ENCOUNTER — Encounter (HOSPITAL_COMMUNITY): Payer: Self-pay | Admitting: Gastroenterology

## 2016-09-09 DIAGNOSIS — K529 Noninfective gastroenteritis and colitis, unspecified: Principal | ICD-10-CM

## 2016-09-09 LAB — GLUCOSE, CAPILLARY
Glucose-Capillary: 157 mg/dL — ABNORMAL HIGH (ref 65–99)
Glucose-Capillary: 178 mg/dL — ABNORMAL HIGH (ref 65–99)
Glucose-Capillary: 189 mg/dL — ABNORMAL HIGH (ref 65–99)
Glucose-Capillary: 252 mg/dL — ABNORMAL HIGH (ref 65–99)
Glucose-Capillary: 268 mg/dL — ABNORMAL HIGH (ref 65–99)
Glucose-Capillary: 334 mg/dL — ABNORMAL HIGH (ref 65–99)
Glucose-Capillary: 91 mg/dL (ref 65–99)

## 2016-09-09 NOTE — Progress Notes (Signed)
Received call from [place]. Liquid stool improving, now with some soft form as well. Patient feeling better overall.  Gelene Mink.Anna W. Boone, ANP-BC [facility]

## 2016-09-09 NOTE — Progress Notes (Signed)
Inpatient Diabetes Program Recommendations  AACE/ADA: New Consensus Statement on Inpatient Glycemic Control (2015)  Target Ranges:  Prepandial:   less than 140 mg/dL      Peak postprandial:   less than 180 mg/dL (1-2 hours)      Critically ill patients:  140 - 180 mg/dL   Lab Results  Component Value Date   GLUCAP 189 (H) 09/09/2016    Review of Glycemic Control Results for Peter MiniumLING, Edwards (MRN [id number]) as of 09/09/2016 09:00  Ref. Range 09/08/2016 23:28 09/09/2016 00:01 09/09/2016 00:41 09/09/2016 03:50 09/09/2016 07:55  Glucose-Capillary Latest Ref Range: 65 - 99 mg/dL 38 (LL) 49 (L) 213157 (H) 268 (H) 189 (H)   Diabetes history: DM2 Outpatient Diabetes medications: Januvia 50 QD + Metformin 1000 BID + Lantus 18 QD + Humalog 3 units TID for CBG >200 mg/dl Current orders for Inpatient glycemic control: Novolog correction 0-9 units q 4 hrs.  Inpatient Diabetes Program Recommendations:  Noted hypoglycemia post correction administration.  Please consider: - Lantus 9 units (50% home dose) -change Novolog correction to tid with meals since eating and 0-5 units hs.   Thank you, Billy FischerJudy E. Icy Fuhrmann, RN, MSN, CDE Inpatient Glycemic Control Team Team Pager [phone] (8am-5pm) 09/09/2016 9:09 AM

## 2016-09-09 NOTE — Consult Note (Signed)
Referring Provider: Dr. Rinaldo Ratel  Primary Care Physician:  Colon Branch, MD Primary Gastroenterologist:  Dr. Darrick Penna   Date of Admission: 09/04/16 Date of Consultation: 09/09/16  Reason for Consultation:  Colitis  HPI:  Joseph Bowen is a 71 y.o. year old male with a past medical history significant for PTSD, ETOH abuse, COPD, dementia, chronic pancreatitis, presenting from home (has guardian) with abdominal pain and diarrhea. Unreliable historian. Attempted to call [name] [phone]) to obtain further history but had to leave message. CT revealed moderate to severe acute pancolitis without evidence of perforation or abscess. Other incidental findings as noted below. CBC on admission without leukocytosis, albumin low normal at 3.5, lactic acid normal, creatinine normal. Cdiff quick scan negative for toxin, antigen. GI pathogen panel negative. Had originally been on Zosyn but then changed to IV Rocephin and oral Flagyl on 2/24. Per nursing staff, 2 BMs overnight but unsure if diarrhea or not. After review of stool output, it appears to be slowing in frequency.   [place]. Unsure why he is here. Unsure if he has ever had a colonoscopy; however, records reveal Dr. Darrick Penna completed a colonoscopy Jan 2017 with diverticulosis (further findings in [facility]) and need for colonoscopy in 10 years.  Unknown if any family history of colon cancer, unsure if anyone with a history of Crohn's disease or UC. States he isn't having diarrhea today. No rectal bleeding. Abdomen hurts "a little bit". Points to LLQ and draws hands across his lower abdomen as site of discomfort. Comes and goes. Doesn't notice what worsens. No sick contacts that he is aware. Denies N/V. States he is eating well, and it appears all of his breakfast has been eaten. Feels tired sometimes. No chest pain, shortness of breath. Denies any muscle aches/pains. Denies depression/anxiety. Denies ETOH use and  states he stopped "about a year ago". States he does remember being told he had pancreatitis but forgets who told him.     Past Medical History:  Diagnosis Date  . Anemia   . Anxiety   . Benign prostatic hypertrophy   . Chronic pancreatitis (HCC)   . COPD (chronic obstructive pulmonary disease) (HCC)   . Dementia   . Dementia    due to encephalopathy from diabetic coma 2011.  . Depression   . 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  . Essential hypertension   . Euthyroid sick syndrome   . Glaucoma   . Hypoglycemia   . Personal history of alcoholism (HCC)   . Post traumatic stress disorder   . Psychosis   . Retinopathy   . Type 2 diabetes mellitus (HCC)   . Vitamin B deficiency   . Vitamin D deficiency     Past Surgical History:  Procedure Laterality Date  . BACK SURGERY     removal of melanoma of back.  . COLONOSCOPY WITH PROPOFOL N/A 08/15/2015   Dr. Darrick Penna: left colon redundant, pediatric scope used, sigmoid colon woult not adequately insufflate, moderate diverticulosis throughout entire colon, moderate sized IH   . HAND SURGERY Right    middle finger; fractue  . HEMORRHOID SURGERY    . HERNIA REPAIR Left    inguinal  . HIP SURGERY Bilateral    bilateral hip fractures  . MOHS SURGERY     Melanoma    Prior to Admission medications   Medication Sig Start Date End Date Taking? Authorizing Provider  aspirin EC 81 MG tablet Take 81  mg by mouth daily.   Yes Historical Provider, MD  bisacodyl (DULCOLAX) 10 MG suppository Place 10 mg rectally as needed for moderate constipation.   Yes Historical Provider, MD  Dextromethorphan-Quinidine (NUEDEXTA) 20-10 MG CAPS Take 1 capsule by mouth 2 (two) times daily.    Yes Historical Provider, MD  docusate sodium (COLACE) 100 MG capsule Take 100 mg by mouth daily as needed for mild constipation.   Yes Historical Provider, MD  fludrocortisone (FLORINEF) 0.1 MG tablet Take 1 tablet (0.1 mg total) by mouth daily. 08/15/16  Yes  Jonelle Sidle, MD  gabapentin (NEURONTIN) 400 MG capsule Take 400 mg by mouth 3 (three) times daily.   Yes Historical Provider, MD  guaifenesin (ROBITUSSIN) 100 MG/5ML syrup Take 200 mg by mouth every 4 (four) hours as needed for cough.   Yes Historical Provider, MD  insulin glargine (LANTUS) 100 UNIT/ML injection Inject 18 Units into the skin daily.    Yes Historical Provider, MD  insulin lispro (HUMALOG) 100 UNIT/ML injection Inject 3 Units into the skin 3 (three) times daily with meals. If blood sugar is >200.   Yes Historical Provider, MD  levothyroxine (SYNTHROID, LEVOTHROID) 75 MCG tablet Take 75 mcg by mouth daily before breakfast.   Yes Historical Provider, MD  lipase/protease/amylase (CREON-10/PANCREASE) 12000 UNITS CPEP Take 3 capsules by mouth 3 (three) times daily with meals. **Take 30 minutes before meals**   Yes Historical Provider, MD  LORazepam (ATIVAN) 0.5 MG tablet Take one tablet by mouth three times daily; Take one tablet by mouth twice daily as needed for breakthrough anxiety Patient taking differently: Take 0.5 mg by mouth 3 (three) times daily.  05/03/13  Yes Sharon Seller, NP  Melatonin 3 MG TABS Take 3 mg by mouth at bedtime.   Yes Historical Provider, MD  metFORMIN (GLUCOPHAGE) 1000 MG tablet Take 1,000 mg by mouth 2 (two) times daily with a meal.   Yes Historical Provider, MD  OLANZapine (ZYPREXA) 5 MG tablet Take 5 mg by mouth at bedtime.   Yes Historical Provider, MD  prazosin (MINIPRESS) 2 MG capsule Take 2 mg by mouth at bedtime.   Yes Historical Provider, MD  Propylene Glycol (SYSTANE BALANCE) 0.6 % SOLN Place 1 drop into both eyes at bedtime.   Yes Historical Provider, MD  rosuvastatin (CRESTOR) 10 MG tablet Take 10 mg by mouth daily.   Yes Historical Provider, MD  sennosides-docusate sodium (SENOKOT-S) 8.6-50 MG tablet Take 1 tablet by mouth 2 (two) times daily.   Yes Historical Provider, MD  sertraline (ZOLOFT) 25 MG tablet Take 25 mg by mouth daily.   Yes  Historical Provider, MD  sitaGLIPtin (JANUVIA) 50 MG tablet Take 50 mg by mouth daily.   Yes Historical Provider, MD  thiamine 100 MG tablet Take 100 mg by mouth daily.   Yes Historical Provider, MD  traMADol (ULTRAM) 50 MG tablet Take 25 mg by mouth every 8 (eight) hours as needed for moderate pain.    Yes Historical Provider, MD  venlafaxine XR (EFFEXOR-XR) 150 MG 24 hr capsule Take 150 mg by mouth daily with breakfast.   Yes Historical Provider, MD    Current Facility-Administered Medications  Medication Dose Route Frequency Provider Last Rate Last Dose  . benzonatate (TESSALON) capsule 200 mg  200 mg Oral TID PRN Leda Gauze, NP   200 mg at 09/08/16 1805  . cefTRIAXone (ROCEPHIN) 2 g in dextrose 5 % 50 mL IVPB  2 g Intravenous Q24H Filbert Schilder, MD  2 g at 09/08/16 1946  . dextrose 5 % and 0.9 % NaCl with KCl 20 mEq/L infusion   Intravenous Continuous Haydee Monicaachal A Deshannon, MD 100 mL/hr at 09/09/16 0205    . enoxaparin (LOVENOX) injection 40 mg  40 mg Subcutaneous Q24H Haydee Monicaachal A Symeon, MD   40 mg at 09/09/16 1011  . gabapentin (NEURONTIN) capsule 400 mg  400 mg Oral TID Haydee Monicaachal A Arlington, MD   400 mg at 09/09/16 1011  . guaiFENesin (ROBITUSSIN) 100 MG/5ML solution 100 mg  5 mL Oral Q4H PRN Filbert SchilderAlexandria U Kadolph, MD   100 mg at 09/08/16 1806  . insulin aspart (novoLOG) injection 0-9 Units  0-9 Units Subcutaneous Q4H Haydee Monicaachal A Miley, MD   2 Units at 09/09/16 [phone] [id number]  . lipase/protease/amylase (CREON) capsule 36,000 Units  36,000 Units Oral TID WC Haydee Monicaachal A Antaeus, MD   36,000 Units at 09/09/16 0818  . LORazepam (ATIVAN) injection 0.5 mg  0.5 mg Intramuscular Q6H PRN Filbert SchilderAlexandria U Kadolph, MD   0.5 mg at 09/06/16 0259  . LORazepam (ATIVAN) tablet 0.5 mg  0.5 mg Oral TID Haydee Monicaachal A Ingram, MD   0.5 mg at 09/09/16 1011  . magnesium sulfate IVPB 2 g 50 mL  2 g Intravenous Once Filbert SchilderAlexandria U Kadolph, MD      . metroNIDAZOLE (FLAGYL) tablet 500 mg  500 mg Oral Q8H Filbert SchilderAlexandria U Kadolph, MD   500 mg at  09/09/16 [id number]  . OLANZapine (ZYPREXA) tablet 5 mg  5 mg Oral QHS Haydee Monicaachal A Arslan, MD   5 mg at 09/08/16 2157  . ondansetron (ZOFRAN) tablet 4 mg  4 mg Oral Q6H PRN Haydee Monicaachal A Tiyon, MD       Or  . ondansetron [facility](ZOFRAN) injection 4 mg  4 mg Intravenous Q6H PRN Haydee Monicaachal A Deklin, MD      . prazosin (MINIPRESS) capsule 2 mg  2 mg Oral QHS Filbert SchilderAlexandria U Kadolph, MD   2 mg at 09/08/16 2157  . sertraline (ZOLOFT) tablet 25 mg  25 mg Oral Daily Haydee Monicaachal A Hyland, MD   25 mg at 09/09/16 1011  . thiamine (VITAMIN B-1) tablet 100 mg  100 mg Oral Daily Haydee Monicaachal A Eissa, MD   100 mg at 09/09/16 1011  . venlafaxine XR (EFFEXOR-XR) 24 hr capsule 150 mg  150 mg Oral Q breakfast Haydee Monicaachal A Tahir, MD   150 mg at 09/09/16 [id number]    Allergies as of 09/04/2016  . (No Known Allergies)    Family History  Problem Relation Age of Onset  . Heart disease    . Diabetes      Social History   Social History  . Marital status: Divorced    Spouse name: N/A  . Number of children: N/A  . Years of education: N/A   Occupational History  . Not on file.   Social History Main Topics  . Smoking status: Current Some Day Smoker    Packs/day: 1.50    Years: 48.00    Types: Cigarettes    Start date: 09/17/1961    Last attempt to quit: 09/17/2009  . Smokeless tobacco: Never Used  . Alcohol use No     Comment: hx of alcoholism-last used 2011  . Drug use: No  . Sexual activity: No   Other Topics Concern  . Not on file   Social History Narrative  . No narrative on file    Review of Systems: Negative unless mentioned in HPI.   Physical Exam: Vital signs in  last 24 hours: Temp:  [98.1 F (36.7 C)-98.5 F (36.9 C)] 98.5 F (36.9 C) (02/26 0645) Pulse Rate:  [62-74] 62 (02/26 0645) Resp:  [18] 18 (02/26 0645) BP: (145-156)/(82-88) 156/82 (02/26 0645) SpO2:  [95 %-100 %] 95 % (02/26 0645) Weight:  [139 lb 5.3 oz (63.2 kg)] 139 lb 5.3 oz (63.2 kg) (02/26 0645) Last BM Date: 09/09/16 General:   Alert,  Well-developed,  well-nourished, pleasant and cooperative in NAD Head:  Normocephalic and atraumatic. Eyes:  Sclera clear, no icterus.   Conjunctiva pink. Ears:  Normal auditory acuity. Mouth:  Edentulous  Lungs:  Clear throughout to auscultation.   Heart:  Regular rate and rhythm Abdomen:  Soft, very mild TTP LLQ and nondistended. No masses, hepatosplenomegaly or hernias noted. Normal bowel sounds, without guarding, and without rebound.   Rectal:  Deferred  Msk:  Symmetrical without gross deformities. Normal posture. Extremities:  Without edema. Neurologic:  Alert and  oriented to person, time.  Skin:  Intact without significant lesions or rashes. Psych:  Alert and cooperative. Flat affect   Intake/Output from previous day: 02/25 0701 - 02/26 0700 In: 5352.3 [P.O.:360; I.V.:4892.3; IV Piggyback:100] Out: 2225 [Urine:2225] Intake/Output this shift: No intake/output data recorded.  Lab Results:  Recent Labs  09/06/16 1308  WBC 3.7*  HGB 12.0*  HCT 35.8*  PLT 216   BMET  Recent Labs  09/06/16 1308  NA 139  K 3.7  CL 105  CO2 27  GLUCOSE 153*  BUN 8  CREATININE 0.66  CALCIUM 7.7*   C-Diff  Recent Labs  09/06/16 2100  CDIFFTOX NEGATIVE    Studies/Results: CT abd/pelvis with contrast 2/21 IMPRESSION: 1. Moderate to severe acute pancolitis. No evidence for perforation or abscess. 2. Small volume of ascites is likely inflammatory and related to colitis. 3. Mild distention of small bowel probably represents a reactive ileus. 4. 10 mm pulmonary nodule in left upper lobe. Scarring was present in this area on the prior CT of the chest and this may be related. Consider one of the following in 3 months for both low-risk and high-risk individuals: (a) repeat chest CT, (b) follow-up PET-CT, or (c) tissue sampling. This recommendation follows the consensus statement: Guidelines for Management of Incidental Pulmonary Nodules Detected on CT Images: From the Fleischner Society 2017;  Radiology 2017; 284:228-243. 5. Small pericardial effusion. 6. Small right-sided pleural effusion. 7. Right kidney lower pole 11 mm stone. 8. Severe aortic atherosclerosis. 9. Chronic pancreatitis. 10. Prostate enlargement with an enhancing masslike component invaginating into the floor bladder measuring up to 23 mm, this may represent central gland hypertrophy or prostate cancer. Clinical correlation recommended. 11. Mild coronary artery calcification. Paraseptal emphysema of the lungs.    Impression: 71 year old male admitted with acute, non-bloody diarrhea, with negative Cdiff and GI pathogen panel. Clinically, he seems to have decreased frequency of stool after discussion with nursing staff and review of stool output. Still difficult to tell if dealing with significant diarrhea, as he is a poor historian. However, he is eating well and appears in no distress. Last colonoscopy approximately a year ago as noted above. Doubt a false negative Cdiff sample at this point. Will discontinue Rocephin but continue Flagyl orally TID. He is on weight-based dosing (on lower end) for pancreatic enzyme therapy; could consider increasing this to 72,000 TID with meals and 36,000 with snacks. Will continue to monitor.    Plan: Discontinue Rocephin and continue Flagyl orally Discussed with nursing staff to monitor stool output and inform me  of any further diarrhea; keep strict I/Os Continue Creon 36,000 units for now but consider increasing Advance to low residue (soft) diet  Further evaluation of abnormal CT findings per hospitalist and PCP as outpatient Will continue to follow with you  Gelene Mink, ANP-BC [facility] Gastroenterology     LOS: 5 days    09/09/2016, 10:20 AM

## 2016-09-09 NOTE — Progress Notes (Signed)
PROGRESS NOTE    Joseph Bowen  ZOX:[id number] DOB: 08/07/1945 DOA: 09/04/2016 PCP: Colon BranchQURESHI, AYYAZ, MD    Brief Narrative:  Joseph Bowen is a 71 y.o. male with medical history significant of PTSD, COPD, etoh abuse, dementia, pancreatitis comes in from home (pt says he lives alone, per EDP he came from group home) with over one day of abdominal pain with no n/v with some diarrhea.  No fevers.  He also c/o right scrotal pain.  Denies any urinary symptoms.   He denies drinking etoh regularly.  Pt found to have pancolitis and referred for admission for his infection.  He denies any recent abx.  Diarrhea did not improve even with treatment initially with Zosyn and then with Rocephin and Flagyl.  Gastroenterology consulted.   Assessment & Plan:   Principal Problem:   Colitis Active Problems:   Type 2 diabetes mellitus (HCC)   Wernicke-Korsakoff syndrome (alcoholic) (HCC)   Dementia without behavioral disturbance   Personal history of alcoholism (HCC)   Post traumatic stress disorder   Chronic obstructive pulmonary disease (HCC)   Hydrocele   Colitis - ceftriaxone d/c'ed - continue oral flagyl - patient's guardian [facility] - increased to soft doet - no tenderness on exam today - gastroenterology consulted- appreciate their recommendations  - C diff negative - GI pathogen panel negative  Tachycardia - telemetry strip improperly labeled as patient but belonged to another patient  Type 2 diabetes mellitus (HCC) - SSI - add lantus 6 units  Wernicke-Korsakoff syndrome (alcoholic) (HCC) - noted  Dementia - noted - need to avoid opiates per patient's guardian as this makes him violent  Personal history of alcoholism (HCC) - denied usage at this time - Alcohol level <5 - UDS positive for benzos which patient chronically takes  Post traumatic stress disorder - noted - will avoid opiates as this tends to aggrevate PTSD  per his guardian - restart prazosin   COPD (chronic obstructive pulmonary disease) (HCC) - stable and compensated at this time   Bilateral hydroceles  - imaging neg for any torsion - discussed with his guardian that he may need urology eval at some point - on physical exam today patient scrotum does not appear significant enlarged - CT scan also showed mass invading bladder floor- discussed this with guardian and told [name] he will need follow up for both the hydroceles and the mass outpatient       DVT prophylaxis: scds  Code Status:  full Family Communication: no family bedside Disposition Plan:  pending tolerance of diet (and advancement of diet)     Consultants:   None  Procedures:   None  Antimicrobials:   Zosyn 2/21> 2/24  Rocephin 2/24>  Flagyl 2/24>   Subjective: Patient in a good mood this morning.  Reports that he feels well and wants to eat lunch.    Objective: Vitals:   09/08/16 1426 09/08/16 2234 09/09/16 0645 09/09/16 1435  BP: (!) 146/88 (!) 145/86 (!) 156/82 (!) 165/88  Pulse: 74 74 62 67  Resp: 18 18 18 20   Temp: 98.3 F (36.8 C) 98.1 F (36.7 C) 98.5 F (36.9 C) 98.5 F (36.9 C)  TempSrc: Oral Oral Oral Oral  SpO2: 96% 100% 95% 98%  Weight:   63.2 kg (139 lb 5.3 oz)   Height:        Intake/Output Summary (Last 24 hours) at 09/09/16 1634 Last data filed at 09/09/16 1610  Gross per 24 hour  Intake             6459 ml  Output             2625 ml  Net             3834 ml   Filed Weights   09/06/16 0549 09/07/16 0500 09/09/16 0645  Weight: 68.5 kg (151 lb 0.2 oz) 68.3 kg (150 lb 9.2 oz) 63.2 kg (139 lb 5.3 oz)    Examination:  General exam: Appears calm today- sleeping Respiratory system: Clear to auscultation. Respiratory effort normal. Cardiovascular system: S1 & S2 heard, RRR. No JVD, murmurs, rubs, gallops or clicks. No pedal edema. Gastrointestinal system: Abdomen is nondistended, soft and nontender. No organomegaly or  masses felt. Normal bowel sounds heard. Central nervous system:No focal neurological deficits. Extremities: Symmetric 5 x 5 power. Skin: No rashes, lesions or ulcers Psychiatry: calm affect today     Data Reviewed: I have personally reviewed following labs and imaging studies  CBC:  Recent Labs Lab 09/04/16 1853 09/05/16 0521 09/06/16 1308  WBC 5.8 4.7 3.7*  NEUTROABS 4.8  --  2.4  HGB 13.5 12.8* 12.0*  HCT 40.8 37.5* 35.8*  MCV 89.1 88.2 88.6  PLT 242 209 216   Basic Metabolic Panel:  Recent Labs Lab 09/03/16 2203 09/04/16 1853 09/05/16 0521 09/06/16 1308  NA  --  139 135 139  K  --  3.0* 3.3* 3.7  CL  --  101 103 105  CO2  --  28 24 27   GLUCOSE  --  61* 144* 153*  BUN  --  14 12 8   CREATININE  --  0.75 0.70 0.66  CALCIUM  --  8.5* 7.4* 7.7*  MG 1.1*  --   --  1.6*   GFR: Estimated Creatinine Clearance: 77.9 mL/min (by C-G formula based on SCr of 0.66 mg/dL). Liver Function Tests:  Recent Labs Lab 09/04/16 1853  AST 45*  ALT 18  ALKPHOS 75  BILITOT 0.6  PROT 6.5  ALBUMIN 3.5    Recent Labs Lab 09/04/16 2203  LIPASE <10*   No results for input(s): AMMONIA in the last 168 hours. Coagulation Profile: No results for input(s): INR, PROTIME in the last 168 hours. Cardiac Enzymes: No results for input(s): CKTOTAL, CKMB, CKMBINDEX, TROPONINI in the last 168 hours. BNP (last 3 results) No results for input(s): PROBNP in the last 8760 hours. HbA1C: No results for input(s): HGBA1C in the last 72 hours. CBG:  Recent Labs Lab 09/09/16 0041 09/09/16 0350 09/09/16 0755 09/09/16 1217 09/09/16 1556  GLUCAP 157* 268* 189* 252* 334*   Lipid Profile: No results for input(s): CHOL, HDL, LDLCALC, TRIG, CHOLHDL, LDLDIRECT in the last 72 hours. Thyroid Function Tests: No results for input(s): TSH, T4TOTAL, FREET4, T3FREE, THYROIDAB in the last 72 hours. Anemia Panel: No results for input(s): VITAMINB12, FOLATE, FERRITIN, TIBC, IRON, RETICCTPCT in the  last 72 hours. Sepsis Labs:  Recent Labs Lab 09/04/16 2231 09/05/16 0037  LATICACIDVEN 1.0 0.8    Recent Results (from the past 240 hour(s))  MRSA PCR Screening     Status: None   Collection Time: 09/05/16 12:55 AM  Result Value Ref Range Status   MRSA by PCR NEGATIVE NEGATIVE Final    Comment:        The GeneXpert MRSA Assay (FDA approved for NASAL specimens only), is one component of a comprehensive MRSA colonization surveillance program. It is not intended to diagnose MRSA infection nor to guide or monitor  treatment for MRSA infections.   C difficile quick scan w PCR reflex     Status: None   Collection Time: 09/06/16  9:00 PM  Result Value Ref Range Status   C Diff antigen NEGATIVE NEGATIVE Final   C Diff toxin NEGATIVE NEGATIVE Final   C Diff interpretation No C. difficile detected.  Final  Gastrointestinal Panel by PCR , Stool     Status: None   Collection Time: 09/06/16  9:00 PM  Result Value Ref Range Status   Campylobacter species NOT DETECTED NOT DETECTED Final   Plesimonas shigelloides NOT DETECTED NOT DETECTED Final   Salmonella species NOT DETECTED NOT DETECTED Final   Yersinia enterocolitica NOT DETECTED NOT DETECTED Final   Vibrio species NOT DETECTED NOT DETECTED Final   Vibrio cholerae NOT DETECTED NOT DETECTED Final   Enteroaggregative E coli (EAEC) NOT DETECTED NOT DETECTED Final   Enteropathogenic E coli (EPEC) NOT DETECTED NOT DETECTED Final   Enterotoxigenic E coli (ETEC) NOT DETECTED NOT DETECTED Final   Shiga like toxin producing E coli (STEC) NOT DETECTED NOT DETECTED Final   Shigella/Enteroinvasive E coli (EIEC) NOT DETECTED NOT DETECTED Final   Cryptosporidium NOT DETECTED NOT DETECTED Final   Cyclospora cayetanensis NOT DETECTED NOT DETECTED Final   Entamoeba histolytica NOT DETECTED NOT DETECTED Final   Giardia lamblia NOT DETECTED NOT DETECTED Final   Adenovirus F40/41 NOT DETECTED NOT DETECTED Final   Astrovirus NOT DETECTED NOT  DETECTED Final   Norovirus GI/GII NOT DETECTED NOT DETECTED Final   Rotavirus A NOT DETECTED NOT DETECTED Final   Sapovirus (I, II, IV, and V) NOT DETECTED NOT DETECTED Final         Radiology Studies: No results found.      Scheduled Meds: . enoxaparin (LOVENOX) injection  40 mg Subcutaneous Q24H  . gabapentin  400 mg Oral TID  . insulin aspart  0-9 Units Subcutaneous Q4H  . lipase/protease/amylase  36,000 Units Oral TID WC  . LORazepam  0.5 mg Oral TID  . magnesium sulfate 1 - 4 g bolus IVPB  2 g Intravenous Once  . metroNIDAZOLE  500 mg Oral Q8H  . OLANZapine  5 mg Oral QHS  . prazosin  2 mg Oral QHS  . sertraline  25 mg Oral Daily  . thiamine  100 mg Oral Daily  . venlafaxine XR  150 mg Oral Q breakfast   Continuous Infusions: . dextrose 5 % and 0.9 % NaCl with KCl 20 mEq/L 100 mL/hr at 09/09/16 1222     LOS: 5 days    Time spent: 30 minutes    Katrinka Blazing, MD [facility] Pager [phone]  If 7PM-7AM, please contact night-coverage [email or web address] Password [facility] 09/09/2016, 4:34 PM

## 2016-09-10 LAB — GLUCOSE, CAPILLARY
Glucose-Capillary: 177 mg/dL — ABNORMAL HIGH (ref 65–99)
Glucose-Capillary: 194 mg/dL — ABNORMAL HIGH (ref 65–99)
Glucose-Capillary: 295 mg/dL — ABNORMAL HIGH (ref 65–99)

## 2016-09-10 MED ORDER — PANCRELIPASE (LIP-PROT-AMYL) 36000-114000 UNITS PO CPEP: 36000.0000 [IU] | ORAL_CAPSULE | Freq: Three times a day (TID) | ORAL | 0 refills | Status: DC

## 2016-09-10 MED ORDER — INSULIN GLARGINE 100 UNIT/ML ~~LOC~~ SOLN: 9.0000 [IU] | Freq: Every day | SUBCUTANEOUS | 11 refills | Status: DC

## 2016-09-10 MED ORDER — METRONIDAZOLE 500 MG PO TABS: 500.0000 mg | ORAL_TABLET | Freq: Three times a day (TID) | ORAL | 0 refills | Status: AC

## 2016-09-10 NOTE — Care Management Important Message (Signed)
Important Message  Patient Details  Name: Peter MiniumDavid Korpi MRN: [id number] Date of Birth: 03/04/1946   Medicare Important Message Given:  Yes    Malcolm MetroChildress, Seth Higginbotham Demske, RN 09/10/2016, 2:16 PM

## 2016-09-10 NOTE — Progress Notes (Signed)
Joseph Bowen discharged Skilled nursing facility per MD order.  Discharge instructions reviewed and discussed with the patient, all questions and concerns answered. Copy of instructions and sent with patient.  Allergies as of 09/10/2016   No Known Allergies     Medication List    TAKE these medications   aspirin EC 81 MG tablet Take 81 mg by mouth daily.   bisacodyl 10 MG suppository Commonly known as:  DULCOLAX Place 10 mg rectally as needed for moderate constipation.   docusate sodium 100 MG capsule Commonly known as:  COLACE Take 100 mg by mouth daily as needed for mild constipation.   fludrocortisone 0.1 MG tablet Commonly known as:  FLORINEF Take 1 tablet (0.1 mg total) by mouth daily.   gabapentin 400 MG capsule Commonly known as:  NEURONTIN Take 400 mg by mouth 3 (three) times daily.   guaifenesin 100 MG/5ML syrup Commonly known as:  ROBITUSSIN Take 200 mg by mouth every 4 (four) hours as needed for cough.   insulin glargine 100 UNIT/ML injection Commonly known as:  LANTUS Inject 0.09 mLs (9 Units total) into the skin daily. What changed:  how much to take Notes to patient:  Use as directed   insulin lispro 100 UNIT/ML injection Commonly known as:  HUMALOG Inject 3 Units into the skin 3 (three) times daily with meals. If blood sugar is >200. Notes to patient:  Use as directed   levothyroxine 75 MCG tablet Commonly known as:  SYNTHROID, LEVOTHROID Take 75 mcg by mouth daily before breakfast.   lipase/protease/amylase [id number] UNITS Cpep capsule Commonly known as:  CREON Take 1 capsule (36,000 Units total) by mouth 3 (three) times daily with meals. What changed:  medication strength  additional instructions   LORazepam 0.5 MG tablet Commonly known as:  ATIVAN Take one tablet by mouth three times daily; Take one tablet by mouth twice daily as needed for breakthrough anxiety What changed:  how much to take  how to take this  when to take  this  additional instructions   Melatonin 3 MG Tabs Take 3 mg by mouth at bedtime.   metFORMIN 1000 MG tablet Commonly known as:  GLUCOPHAGE Take 1,000 mg by mouth 2 (two) times daily with a meal.   metroNIDAZOLE 500 MG tablet Commonly known as:  FLAGYL Take 1 tablet (500 mg total) by mouth every 8 (eight) hours.   NUEDEXTA 20-10 MG Caps Generic drug:  Dextromethorphan-Quinidine Take 1 capsule by mouth 2 (two) times daily.   OLANZapine 5 MG tablet Commonly known as:  ZYPREXA Take 5 mg by mouth at bedtime.   prazosin 2 MG capsule Commonly known as:  MINIPRESS Take 2 mg by mouth at bedtime.   rosuvastatin 10 MG tablet Commonly known as:  CRESTOR Take 10 mg by mouth daily.   sennosides-docusate sodium 8.6-50 MG tablet Commonly known as:  SENOKOT-S Take 1 tablet by mouth 2 (two) times daily.   sertraline 25 MG tablet Commonly known as:  ZOLOFT Take 25 mg by mouth daily.   sitaGLIPtin 50 MG tablet Commonly known as:  JANUVIA Take 50 mg by mouth daily.   SYSTANE BALANCE 0.6 % Soln Generic drug:  Propylene Glycol Place 1 drop into both eyes at bedtime.   thiamine 100 MG tablet Take 100 mg by mouth daily.   traMADol 50 MG tablet Commonly known as:  ULTRAM Take 25 mg by mouth every 8 (eight) hours as needed for moderate pain.   venlafaxine XR 150 MG 24  hr capsule Commonly known as:  EFFEXOR-XR Take 150 mg by mouth daily with breakfast.       Pt transported to [facility] via their staff member.  Joseph Bowen 09/10/2016 6:04 PM

## 2016-09-10 NOTE — Discharge Summary (Signed)
Physician Discharge Summary  Peter MiniumDavid Derise ZOX:[id number] DOB: 12/01/1945 DOA: 09/04/2016  PCP: Colon BranchQURESHI, AYYAZ, MD  Admit date: 09/04/2016 Discharge date: 09/10/2016  Admitted From: SNF Disposition:   SNF  Recommendations for Outpatient Follow-up:  1. Follow up with PCP in 1-2 weeks 2. Make appointment to see Urology about hydrocele as well as bladder floor mass 3. Please obtain BMP/CBC in one week 4. Monitor blood glucose ACHS and increase insulin pending results  Home Health:No Equipment/Devices: None  Discharge Condition: Stable  CODE STATUS:Full code  Diet recommendation:Carb Modified  Brief/Interim Summary: Joseph Bowen a 71 y.o.malewith medical history significant of PTSD, COPD, etoh abuse, dementia, pancreatitis comes in from home (pt says he lives alone, per EDP he came from group home) with over one day of abdominal pain with no n/v with some diarrhea. No fevers. He also c/o right scrotal pain. Denies any urinary symptoms. He denies drinking etoh regularly. Pt found to have pancolitis and referred for admission for his infection. He denies any recent abx.  Diarrhea did not improve even with treatment initially with Zosyn and then with Rocephin and Flagyl.  Gastroenterology consulted. He was continued on Flagyl but rocephin was discontinued and his pancreatic enzymes were increased.  He was stable for discharge on 09/10/16.  Discharge Diagnoses:  Principal Problem:   Colitis Active Problems:   Type 2 diabetes mellitus (HCC)   Wernicke-Korsakoff syndrome (alcoholic) (HCC)   Dementia without behavioral disturbance   Personal history of alcoholism (HCC)   Post traumatic stress disorder   Chronic obstructive pulmonary disease (HCC)   Hydrocele    Discharge Instructions  Discharge Instructions    Ambulatory referral to Urology    Complete by:  As directed    Call MD for:  difficulty breathing, headache or visual disturbances    Complete by:  As directed    Call MD  for:  extreme fatigue    Complete by:  As directed    Call MD for:  hives    Complete by:  As directed    Call MD for:  persistant dizziness or light-headedness    Complete by:  As directed    Call MD for:  persistant nausea and vomiting    Complete by:  As directed    Call MD for:  severe uncontrolled pain    Complete by:  As directed    Call MD for:  temperature >100.4    Complete by:  As directed    Diet Carb Modified    Complete by:  As directed    Discharge instructions    Complete by:  As directed    Check blood sugars ACHS and increase Lantus and novolog pending readings Flagyl TID for 3 more days Increase pancreatic enzymes   Discharge instructions    Complete by:  As directed    Please schedule follow up with a urologist for evaluation of hydrocele and bladder floor mass.   Increase activity slowly    Complete by:  As directed      Allergies as of 09/10/2016   No Known Allergies     Medication List    TAKE these medications   aspirin EC 81 MG tablet Take 81 mg by mouth daily.   bisacodyl 10 MG suppository Commonly known as:  DULCOLAX Place 10 mg rectally as needed for moderate constipation.   docusate sodium 100 MG capsule Commonly known as:  COLACE Take 100 mg by mouth daily as needed for mild constipation.   fludrocortisone 0.1  MG tablet Commonly known as:  FLORINEF Take 1 tablet (0.1 mg total) by mouth daily.   gabapentin 400 MG capsule Commonly known as:  NEURONTIN Take 400 mg by mouth 3 (three) times daily.   guaifenesin 100 MG/5ML syrup Commonly known as:  ROBITUSSIN Take 200 mg by mouth every 4 (four) hours as needed for cough.   insulin glargine 100 UNIT/ML injection Commonly known as:  LANTUS Inject 0.09 mLs (9 Units total) into the skin daily. What changed:  how much to take   insulin lispro 100 UNIT/ML injection Commonly known as:  HUMALOG Inject 3 Units into the skin 3 (three) times daily with meals. If blood sugar is >200.    levothyroxine 75 MCG tablet Commonly known as:  SYNTHROID, LEVOTHROID Take 75 mcg by mouth daily before breakfast.   lipase/protease/amylase 40981 UNITS Cpep capsule Commonly known as:  CREON Take 1 capsule (36,000 Units total) by mouth 3 (three) times daily with meals. What changed:  medication strength  additional instructions   LORazepam 0.5 MG tablet Commonly known as:  ATIVAN Take one tablet by mouth three times daily; Take one tablet by mouth twice daily as needed for breakthrough anxiety What changed:  how much to take  how to take this  when to take this  additional instructions   Melatonin 3 MG Tabs Take 3 mg by mouth at bedtime.   metFORMIN 1000 MG tablet Commonly known as:  GLUCOPHAGE Take 1,000 mg by mouth 2 (two) times daily with a meal.   metroNIDAZOLE 500 MG tablet Commonly known as:  FLAGYL Take 1 tablet (500 mg total) by mouth every 8 (eight) hours.   NUEDEXTA 20-10 MG Caps Generic drug:  Dextromethorphan-Quinidine Take 1 capsule by mouth 2 (two) times daily.   OLANZapine 5 MG tablet Commonly known as:  ZYPREXA Take 5 mg by mouth at bedtime.   prazosin 2 MG capsule Commonly known as:  MINIPRESS Take 2 mg by mouth at bedtime.   rosuvastatin 10 MG tablet Commonly known as:  CRESTOR Take 10 mg by mouth daily.   sennosides-docusate sodium 8.6-50 MG tablet Commonly known as:  SENOKOT-S Take 1 tablet by mouth 2 (two) times daily.   sertraline 25 MG tablet Commonly known as:  ZOLOFT Take 25 mg by mouth daily.   sitaGLIPtin 50 MG tablet Commonly known as:  JANUVIA Take 50 mg by mouth daily.   SYSTANE BALANCE 0.6 % Soln Generic drug:  Propylene Glycol Place 1 drop into both eyes at bedtime.   thiamine 100 MG tablet Take 100 mg by mouth daily.   traMADol 50 MG tablet Commonly known as:  ULTRAM Take 25 mg by mouth every 8 (eight) hours as needed for moderate pain.   venlafaxine XR 150 MG 24 hr capsule Commonly known as:   EFFEXOR-XR Take 150 mg by mouth daily with breakfast.      Follow-up Information    Colon Branch, MD. Schedule an appointment as soon as possible for a visit in 1 week(s).   Specialty:  Internal Medicine Contact information: [address] [place] [address] [phone]          No Known Allergies  Consultations:  Gastroenterology  Diabetes Coordinator  Procedures/Studies: US Scrotum  Result Date: 09/04/2016 CLINICAL DATA:  Evaluate for mass or testicular torsion. Scrotal swelling. EXAM: ULTRASOUND OF SCROTUM TECHNIQUE: Complete ultrasound examination of the testicles, epididymis, and other scrotal structures was performed. COMPARISON:  None. FINDINGS: Right testicle Measurements: 3.9 x 1.5 x  1.5 cm. No mass or microlithiasis visualized. Left testicle Measurements: 4.5 x 2.3 x 3.0 cm. No mass or microlithiasis visualized. Right epididymis:  6 mm epididymal cyst noted. Left epididymis:  Normal in size and appearance. Hydrocele:  Large right hydrocele.  Small left hydrocele identified. Varicocele:  None visualized. IMPRESSION: 1. No evidence for testicular mass or torsion. 2. Bilateral hydroceles, right greater than left. 3. Atrophy of the right testis. Electronically Signed   By: Signa Kell M.D.   On: 09/04/2016 20:09   Ct Abdomen Pelvis W Contrast  Result Date: 09/04/2016 CLINICAL DATA:  71 y/o  M; right testicle pain and diarrhea. EXAM: CT ABDOMEN AND PELVIS WITH CONTRAST TECHNIQUE: Multidetector CT imaging of the abdomen and pelvis was performed using the standard protocol following bolus administration of intravenous contrast. CONTRAST:  ISOVUE-300 IOPAMIDOL (ISOVUE-300) INJECTION 61% COMPARISON:  06/02/2011 CT abdomen and pelvis. 08/08/2010 CT of the chest. FINDINGS: Lower chest: New 10 mm pulmonary nodule in the left upper lobe (series 8 image 4). Mild coronary artery calcification in the LAD. Small pericardial effusion. Small right pleural effusion.  Paraseptal emphysema. Hepatobiliary: No focal liver abnormality is seen. No gallstones, gallbladder wall thickening, or biliary dilatation. Pancreas: Atrophic in calcified pancreas compatible sequelae of chronic pancreatitis. Spleen: Normal in size without focal abnormality. Adrenals/Urinary Tract: Well-circumscribed fluid attenuating lesions within the left kidney measuring up to 26 mm compatible with cysts seen on prior CT of abdomen and pelvis. Normal adrenal glands. 11 mm right kidney lower pole stone. No hydronephrosis. Normal bladder. Stomach/Bowel: Diffuse moderate dilatation of the colon with extensive enhancing wall thickening compatible with acute colitis. Mild dilatation of small bowel probably represents reactive ileus. Additionally, the appendix is mildly dilated without significant wall thickening, likely representing a reactive change. No pneumoperitoneum or intraperitoneal abscess identified. Vascular/Lymphatic: Aortic atherosclerosis. No enlarged abdominal or pelvic lymph nodes. Embolization of the celiac axis. Reproductive: Prostate enlargement with a enhancing masslike component invaginating into the floor of the bladder measuring 23 x 15 x 20 mm (AP x ML x CC series 2, image 87 and series 6, image 57). Other: Small volume of peritoneal ascites. Musculoskeletal: Degenerative changes of the visible thoracic and lumbar spine most pronounced at the L5-S1 level. Mild bilateral hip osteoarthrosis with acetabular fibrocystic degeneration. Stable mild compression deformity of the L1 vertebral body. IMPRESSION: 1. Moderate to severe acute pancolitis. No evidence for perforation or abscess. 2. Small volume of ascites is likely inflammatory and related to colitis. 3. Mild distention of small bowel probably represents a reactive ileus. 4. 10 mm pulmonary nodule in left upper lobe. Scarring was present in this area on the prior CT of the chest and this may be related. Consider one of the following in 3 months  for both low-risk and high-risk individuals: (a) repeat chest CT, (b) follow-up PET-CT, or (c) tissue sampling. This recommendation follows the consensus statement: Guidelines for Management of Incidental Pulmonary Nodules Detected on CT Images: From the Fleischner Society 2017; Radiology 2017; 284:228-243. 5. Small pericardial effusion. 6. Small right-sided pleural effusion. 7. Right kidney lower pole 11 mm stone. 8. Severe aortic atherosclerosis. 9. Chronic pancreatitis. 10. Prostate enlargement with an enhancing masslike component invaginating into the floor bladder measuring up to 23 mm, this may represent central gland hypertrophy or prostate cancer. Clinical correlation recommended. 11. Mild coronary artery calcification. Paraseptal emphysema of the lungs. Electronically Signed   By: Mitzi Hansen M.D.   On: 09/04/2016 20:56   [place] Art/ven Flow Abd Pelv Doppler  Result Date: 09/04/2016 CLINICAL DATA:  Evaluate for mass or testicular torsion. Scrotal swelling. EXAM: ULTRASOUND OF SCROTUM TECHNIQUE: Complete ultrasound examination of the testicles, epididymis, and other scrotal structures was performed. COMPARISON:  None. FINDINGS: Right testicle Measurements: 3.9 x 1.5 x 1.5 cm. No mass or microlithiasis visualized. Left testicle Measurements: 4.5 x 2.3 x 3.0 cm. No mass or microlithiasis visualized. Right epididymis:  6 mm epididymal cyst noted. Left epididymis:  Normal in size and appearance. Hydrocele:  Large right hydrocele.  Small left hydrocele identified. Varicocele:  None visualized. IMPRESSION: 1. No evidence for testicular mass or torsion. 2. Bilateral hydroceles, right greater than left. 3. Atrophy of the right testis. Electronically Signed   By: Signa Kell M.D.   On: 09/04/2016 20:09      Subjective: Patient feeling well today.  Reports no pain.  Diarrhea slowly decreasing and stools becoming more solid.  Discharge Exam: Vitals:   09/10/16 0447 09/10/16 1419  BP: (!)  113/51 (!) 200/91  Pulse: 64 (!) 59  Resp: 18 20  Temp: 98.6 F (37 C) 98.2 F (36.8 C)   Vitals:   09/09/16 1435 09/09/16 2100 09/10/16 0447 09/10/16 1419  BP: (!) 165/88 (!) 152/83 (!) 113/51 (!) 200/91  Pulse: 67 71 64 (!) 59  Resp: 20 18 18 20   Temp: 98.5 F (36.9 C) 98.3 F (36.8 C) 98.6 F (37 C) 98.2 F (36.8 C)  TempSrc: Oral Oral Oral Oral  SpO2: 98% 97% 97% 100%  Weight:   63.1 kg (139 lb 3.2 oz)   Height:        General: Pt is alert, awake, not in acute distress Cardiovascular: RRR, S1/S2 +, no rubs, no gallops Respiratory: CTA bilaterally, no wheezing, no rhonchi Abdominal: Soft, NT, ND, bowel sounds + Extremities: no edema, no cyanosis    The results of significant diagnostics from this hospitalization (including imaging, microbiology, ancillary and laboratory) are listed below for reference.     Microbiology: Recent Results (from the past 240 hour(s))  MRSA PCR Screening     Status: None   Collection Time: 09/05/16 12:55 AM  Result Value Ref Range Status   MRSA by PCR NEGATIVE NEGATIVE Final    Comment:        The GeneXpert MRSA Assay (FDA approved for NASAL specimens only), is one component of a comprehensive MRSA colonization surveillance program. It is not intended to diagnose MRSA infection nor to guide or monitor treatment for MRSA infections.   C difficile quick scan w PCR reflex     Status: None   Collection Time: 09/06/16  9:00 PM  Result Value Ref Range Status   C Diff antigen NEGATIVE NEGATIVE Final   C Diff toxin NEGATIVE NEGATIVE Final   C Diff interpretation No C. difficile detected.  Final  Gastrointestinal Panel by PCR , Stool     Status: None   Collection Time: 09/06/16  9:00 PM  Result Value Ref Range Status   Campylobacter species NOT DETECTED NOT DETECTED Final   Plesimonas shigelloides NOT DETECTED NOT DETECTED Final   Salmonella species NOT DETECTED NOT DETECTED Final   Yersinia enterocolitica NOT DETECTED NOT DETECTED  Final   Vibrio species NOT DETECTED NOT DETECTED Final   Vibrio cholerae NOT DETECTED NOT DETECTED Final   Enteroaggregative E coli (EAEC) NOT DETECTED NOT DETECTED Final   Enteropathogenic E coli (EPEC) NOT DETECTED NOT DETECTED Final   Enterotoxigenic E coli (ETEC) NOT DETECTED NOT DETECTED Final   Shiga like toxin  producing E coli (STEC) NOT DETECTED NOT DETECTED Final   Shigella/Enteroinvasive E coli (EIEC) NOT DETECTED NOT DETECTED Final   Cryptosporidium NOT DETECTED NOT DETECTED Final   Cyclospora cayetanensis NOT DETECTED NOT DETECTED Final   Entamoeba histolytica NOT DETECTED NOT DETECTED Final   Giardia lamblia NOT DETECTED NOT DETECTED Final   Adenovirus F40/41 NOT DETECTED NOT DETECTED Final   Astrovirus NOT DETECTED NOT DETECTED Final   Norovirus GI/GII NOT DETECTED NOT DETECTED Final   Rotavirus A NOT DETECTED NOT DETECTED Final   Sapovirus (I, II, IV, and V) NOT DETECTED NOT DETECTED Final     Labs: BNP (last 3 results) No results for input(s): BNP in the last 8760 hours. Basic Metabolic Panel:  Recent Labs Lab 09/03/16 2203 09/04/16 1853 09/05/16 0521 09/06/16 1308  NA  --  139 135 139  K  --  3.0* 3.3* 3.7  CL  --  101 103 105  CO2  --  28 24 27   GLUCOSE  --  61* 144* 153*  BUN  --  14 12 8   CREATININE  --  0.75 0.70 0.66  CALCIUM  --  8.5* 7.4* 7.7*  MG 1.1*  --   --  1.6*   Liver Function Tests:  Recent Labs Lab 09/04/16 1853  AST 45*  ALT 18  ALKPHOS 75  BILITOT 0.6  PROT 6.5  ALBUMIN 3.5    Recent Labs Lab 09/04/16 2203  LIPASE <10*   No results for input(s): AMMONIA in the last 168 hours. CBC:  Recent Labs Lab 09/04/16 1853 09/05/16 0521 09/06/16 1308  WBC 5.8 4.7 3.7*  NEUTROABS 4.8  --  2.4  HGB 13.5 12.8* 12.0*  HCT 40.8 37.5* 35.8*  MCV 89.1 88.2 88.6  PLT 242 209 216   Cardiac Enzymes: No results for input(s): CKTOTAL, CKMB, CKMBINDEX, TROPONINI in the last 168 hours. BNP: Invalid input(s): POCBNP CBG:  Recent  Labs Lab 09/09/16 1946 09/09/16 2343 09/10/16 0325 09/10/16 0725 09/10/16 1114  GLUCAP 91 178* 177* 194* 295*   D-Dimer No results for input(s): DDIMER in the last 72 hours. Hgb A1c No results for input(s): HGBA1C in the last 72 hours. Lipid Profile No results for input(s): CHOL, HDL, LDLCALC, TRIG, CHOLHDL, LDLDIRECT in the last 72 hours. Thyroid function studies No results for input(s): TSH, T4TOTAL, T3FREE, THYROIDAB in the last 72 hours.  Invalid input(s): FREET3 Anemia work up No results for input(s): VITAMINB12, FOLATE, FERRITIN, TIBC, IRON, RETICCTPCT in the last 72 hours. Urinalysis    Component Value Date/Time   COLORURINE YELLOW 09/04/2016 1843   APPEARANCEUR CLEAR 09/04/2016 1843   LABSPEC 1.021 09/04/2016 1843   PHURINE 5.0 09/04/2016 1843   GLUCOSEU NEGATIVE 09/04/2016 1843   HGBUR NEGATIVE 09/04/2016 1843   BILIRUBINUR NEGATIVE 09/04/2016 1843   KETONESUR 5 (A) 09/04/2016 1843   PROTEINUR NEGATIVE 09/04/2016 1843   UROBILINOGEN 0.2 01/22/2015 1000   NITRITE NEGATIVE 09/04/2016 1843   LEUKOCYTESUR NEGATIVE 09/04/2016 1843   Sepsis Labs Invalid input(s): PROCALCITONIN,  WBC,  LACTICIDVEN Microbiology Recent Results (from the past 240 hour(s))  MRSA PCR Screening     Status: None   Collection Time: 09/05/16 12:55 AM  Result Value Ref Range Status   MRSA by PCR NEGATIVE NEGATIVE Final    Comment:        The GeneXpert MRSA Assay (FDA approved for NASAL specimens only), is one component of a comprehensive MRSA colonization surveillance program. It is not intended to diagnose MRSA infection nor  to guide or monitor treatment for MRSA infections.   C difficile quick scan w PCR reflex     Status: None   Collection Time: 09/06/16  9:00 PM  Result Value Ref Range Status   C Diff antigen NEGATIVE NEGATIVE Final   C Diff toxin NEGATIVE NEGATIVE Final   C Diff interpretation No C. difficile detected.  Final  Gastrointestinal Panel by PCR , Stool      Status: None   Collection Time: 09/06/16  9:00 PM  Result Value Ref Range Status   Campylobacter species NOT DETECTED NOT DETECTED Final   Plesimonas shigelloides NOT DETECTED NOT DETECTED Final   Salmonella species NOT DETECTED NOT DETECTED Final   Yersinia enterocolitica NOT DETECTED NOT DETECTED Final   Vibrio species NOT DETECTED NOT DETECTED Final   Vibrio cholerae NOT DETECTED NOT DETECTED Final   Enteroaggregative E coli (EAEC) NOT DETECTED NOT DETECTED Final   Enteropathogenic E coli (EPEC) NOT DETECTED NOT DETECTED Final   Enterotoxigenic E coli (ETEC) NOT DETECTED NOT DETECTED Final   Shiga like toxin producing E coli (STEC) NOT DETECTED NOT DETECTED Final   Shigella/Enteroinvasive E coli (EIEC) NOT DETECTED NOT DETECTED Final   Cryptosporidium NOT DETECTED NOT DETECTED Final   Cyclospora cayetanensis NOT DETECTED NOT DETECTED Final   Entamoeba histolytica NOT DETECTED NOT DETECTED Final   Giardia lamblia NOT DETECTED NOT DETECTED Final   Adenovirus F40/41 NOT DETECTED NOT DETECTED Final   Astrovirus NOT DETECTED NOT DETECTED Final   Norovirus GI/GII NOT DETECTED NOT DETECTED Final   Rotavirus A NOT DETECTED NOT DETECTED Final   Sapovirus (I, II, IV, and V) NOT DETECTED NOT DETECTED Final     Time coordinating discharge: 35 minutes  SIGNED:   Katrinka Blazing, MD  [facility] 09/10/2016, 2:22 PM Pager [phone] If 7PM-7AM, please contact night-coverage [email or web address] Password [email or web address]

## 2016-09-10 NOTE — Progress Notes (Signed)
    Subjective: No abdominal pain, no N/V, no rectal bleeding. Tolerating diet. Feels like diarrhea is getting better. Per nursing, appears no stool was charted over night. 4 BMs yesterday, improving consistency. This morning had a soft, formed BM.   Objective: Vital signs in last 24 hours: Temp:  [98.3 F (36.8 C)-98.6 F (37 C)] 98.6 F (37 C) (02/27 0447) Pulse Rate:  [64-71] 64 (02/27 0447) Resp:  [18-20] 18 (02/27 0447) BP: (113-165)/(51-88) 113/51 (02/27 0447) SpO2:  [97 %-98 %] 97 % (02/27 0447) Weight:  [139 lb 3.2 oz (63.1 kg)] 139 lb 3.2 oz (63.1 kg) (02/27 0447) Last BM Date: 09/09/16 General:   Resting with eyes closed, easily awakens.  Abdomen:  Bowel sounds present, soft, non-tender, non-distended. No HSM or hernias noted. No rebound or guarding. No masses appreciated  Extremities:  Without edema. Neurologic:  Alert and  oriented x4 Psych:  Alert and cooperative. Normal mood and affect.  Intake/Output from previous day: 02/26 0701 - 02/27 0700 In: 1346.7 [P.O.:240; I.V.:1106.7] Out: 2250 [Urine:2250] Intake/Output this shift: No intake/output data recorded.   Assessment: 71 year old male admitted with acute, non-bloody diarrhea, with negative Cdiff and GI pathogen panel. Per nursing staff, stool consistency and frequency improving. Soft BM this morning. Unfortunately, he is a poor historian, so subjective information is limited; however, he denies any worsening of symptoms. Last colonoscopy approximately a year ago. Continue oral Flagyl.  Plan: Continue Flagyl 500 mg TID Continue Creon Discussed with nursing stool output and documentation; she will call if any changes Will continue to follow Hopeful discharge soon  Gelene MinkAnna W. Constantin Hillery, ANP-BC [facility]    LOS: 6 days    09/10/2016, 7:54 AM

## 2016-09-10 NOTE — Progress Notes (Signed)
Inpatient Diabetes Program Recommendations  AACE/ADA: New Consensus Statement on Inpatient Glycemic Control (2015)  Target Ranges:  Prepandial:   less than 140 mg/dL      Peak postprandial:   less than 180 mg/dL (1-2 hours)      Critically ill patients:  140 - 180 mg/dL  Results for Joseph Bowen, Crews (MRN [id number]) as of 09/10/2016 08:35  Ref. Range 09/09/2016 07:55 09/09/2016 12:17 09/09/2016 15:56 09/09/2016 19:46 09/09/2016 23:43 09/10/2016 03:25 09/10/2016 07:25  Glucose-Capillary Latest Ref Range: 65 - 99 mg/dL 409189 (H)  Novolog 2 units 252 (H)  Novolog 5 units 334 (H)  Novolog 7 units 91 178 (H)  Novolog 2 units 177 (H)  Novolog 2 units 194 (H)  Novolog 2 units    Review of Glycemic Control  Diabetes history: DM2 Outpatient Diabetes medications: Januvia 50 mg daily,  Metformin 1000 mg BID, Lantus 18 units daily, Humalog 3 units TID for CBG >200 mg/dl Current orders for Inpatient glycemic control: Novolog 0-9 units Q4H  Inpatient Diabetes Program Recommendations: Insulin - Basal: Please consider ordering Lantus 9 units Q24H (starting now). Correction (SSI): If patient is eating, please consider changing frequency of CBGs and Novolog correction to ACHS.  Note: Over the past 24 hours glucose has ranged from 177-334 mg/dl and patient has received a total of Novolog 20 units for correction.  Thanks, Orlando PennerMarie Laddie Naeem, RN, MSN, CDE Diabetes Coordinator Inpatient Diabetes Program [phone] (Team Pager from 8am to 5pm)

## 2016-09-10 NOTE — Clinical Social Work Note (Signed)
[facility]. Pt's DSS guardian, Barnie AldermanMelissa Price and facility aware and agreeable. Will transfer via facility van.  Derenda FennelKara Annmarie Plemmons, LCSW [phone]

## 2016-09-10 NOTE — Progress Notes (Signed)
Pt IV removed, tolerated well.  [facility] is dressed and [facility].  Attempted to call report to facility and they will call back.

## 2016-09-10 NOTE — Progress Notes (Signed)
Report called to [facility]eah [facility],  Answered all questions at this time.

## 2016-09-10 NOTE — Care Management Note (Signed)
Case Management Note  Patient Details  Name: Joseph MiniumDavid Bowen MRN: [id number] Date of Birth: 07/27/1945  Expected Discharge Date:  09/10/16               Expected Discharge Plan:  Skilled Nursing Facility  In-House Referral:  Clinical Social Work  Discharge planning Services    CM  Post Acute Care Choice:  NA Choice offered to:  NA  Status of Service:  Completed, signed off  If discussed at Long Length of Stay Meetings, dates discussed: 09/10/2016    Malcolm MetroChildress, Brier Reid Demske, RN 09/10/2016, 2:17 PM

## 2016-10-22 ENCOUNTER — Ambulatory Visit: Payer: Medicare Other | Admitting: Neurology

## 2016-11-11 ENCOUNTER — Other Ambulatory Visit: Payer: Self-pay | Admitting: Urology

## 2016-11-13 ENCOUNTER — Other Ambulatory Visit: Payer: Self-pay | Admitting: Urology

## 2016-11-13 ENCOUNTER — Encounter (HOSPITAL_COMMUNITY): Payer: Self-pay | Admitting: *Deleted

## 2016-11-14 ENCOUNTER — Encounter (HOSPITAL_COMMUNITY): Payer: Self-pay | Admitting: *Deleted

## 2016-11-18 ENCOUNTER — Ambulatory Visit (HOSPITAL_COMMUNITY): Payer: Medicare Other

## 2016-11-18 ENCOUNTER — Encounter (HOSPITAL_COMMUNITY)
Admission: RE | Disposition: A | Discharge status: Home / Self Care | Payer: Self-pay | Source: Ambulatory Visit | Attending: Urology

## 2016-11-18 ENCOUNTER — Encounter (HOSPITAL_COMMUNITY): Payer: Self-pay | Admitting: *Deleted

## 2016-11-18 ENCOUNTER — Ambulatory Visit (HOSPITAL_COMMUNITY)
Admission: RE | Admit: 2016-11-18 | Discharge: 2016-11-18 | Disposition: A | Discharge status: Home / Self Care | Payer: Medicare Other | Source: Ambulatory Visit | Attending: Urology | Admitting: Urology

## 2016-11-18 DIAGNOSIS — Z7982 Long term (current) use of aspirin: Secondary | ICD-10-CM | POA: Insufficient documentation

## 2016-11-18 DIAGNOSIS — N200 Calculus of kidney: Secondary | ICD-10-CM | POA: Insufficient documentation

## 2016-11-18 DIAGNOSIS — Z794 Long term (current) use of insulin: Secondary | ICD-10-CM | POA: Insufficient documentation

## 2016-11-18 DIAGNOSIS — Z87891 Personal history of nicotine dependence: Secondary | ICD-10-CM | POA: Diagnosis not present

## 2016-11-18 DIAGNOSIS — Z79899 Other long term (current) drug therapy: Secondary | ICD-10-CM | POA: Insufficient documentation

## 2016-11-18 HISTORY — DX: Sleep terrors (night terrors): F51.4

## 2016-11-18 HISTORY — DX: Pain in left shoulder: M25.512

## 2016-11-18 HISTORY — DX: Unspecified urinary incontinence: R32

## 2016-11-18 HISTORY — DX: Radiculopathy, cervical region: M54.12

## 2016-11-18 HISTORY — DX: Constipation, unspecified: K59.00

## 2016-11-18 HISTORY — DX: Dizziness and giddiness: R42

## 2016-11-18 HISTORY — DX: Alcohol abuse, in remission: F10.11

## 2016-11-18 HISTORY — DX: Hypothyroidism, unspecified: E03.9

## 2016-11-18 HISTORY — DX: Orthostatic hypotension: I95.1

## 2016-11-18 HISTORY — DX: Unspecified atherosclerosis: I70.90

## 2016-11-18 HISTORY — DX: Legal blindness, as defined in USA: H54.8

## 2016-11-18 HISTORY — DX: Syncope and collapse: R55

## 2016-11-18 HISTORY — DX: Alcohol use, unspecified with alcohol-induced persisting amnestic disorder: F10.96

## 2016-11-18 HISTORY — DX: Atherosclerosis of other arteries: I70.8

## 2016-11-18 HISTORY — PX: EXTRACORPOREAL SHOCK WAVE LITHOTRIPSY: SHX1557

## 2016-11-18 HISTORY — DX: Difficulty in walking, not elsewhere classified: R26.2

## 2016-11-18 HISTORY — DX: History of falling: Z91.81

## 2016-11-18 HISTORY — DX: Hydrocele, unspecified: N43.3

## 2016-11-18 HISTORY — DX: Insomnia, unspecified: G47.00

## 2016-11-18 LAB — GLUCOSE, CAPILLARY
Glucose-Capillary: 217 mg/dL — ABNORMAL HIGH (ref 65–99)
Glucose-Capillary: 196 mg/dL — ABNORMAL HIGH (ref 65–99)

## 2016-11-18 SURGERY — LITHOTRIPSY, ESWL
Anesthesia: LOCAL | Laterality: Right

## 2016-11-18 MED ORDER — SODIUM CHLORIDE 0.9 % IV SOLN
INTRAVENOUS | Status: DC
  Administered 2016-11-18: 09:00:00 via INTRAVENOUS

## 2016-11-18 MED ORDER — CIPROFLOXACIN HCL 500 MG PO TABS
500.0000 mg | ORAL_TABLET | ORAL | Status: AC
  Administered 2016-11-18: 500 mg via ORAL
  Filled 2016-11-18: qty 1

## 2016-11-18 MED ORDER — DIAZEPAM 5 MG PO TABS
10.0000 mg | ORAL_TABLET | ORAL | Status: AC
  Administered 2016-11-18: 10 mg via ORAL
  Filled 2016-11-18: qty 2

## 2016-11-18 MED ORDER — DIPHENHYDRAMINE HCL 25 MG PO CAPS
25.0000 mg | ORAL_CAPSULE | ORAL | Status: AC
  Administered 2016-11-18: 25 mg via ORAL
  Filled 2016-11-18: qty 1

## 2016-11-18 NOTE — Interval H&P Note (Signed)
History and Physical Interval Note:  11/18/2016 10:21 AM  Joseph Bowen  has presented today for surgery, with the diagnosis of RIGHT RENAL STONE  The various methods of treatment have been discussed with the patient and family. After consideration of risks, benefits and other options for treatment, the patient has consented to  Procedure(s): RIGHT EXTRACORPOREAL SHOCK WAVE LITHOTRIPSY (ESWL) (Right) as a surgical intervention .  The patient's history has been reviewed, patient examined, no change in status, stable for surgery.  I have reviewed the patient's chart and labs.  Questions were answered to the patient's satisfaction.     Joseph Bowen

## 2016-11-18 NOTE — H&P (Signed)
Office Visit Report     11/08/2016   --------------------------------------------------------------------------------   Joseph Bowen  MRN: [id number]  PRIMARY CARE:  Anson Fret, MD  DOB: 1946-06-26, 71 year old Male  REFERRING:  Anson Fret, MD  SSN: -[id number]  PROVIDER:  Jerilee Field, M.D.    LOCATION:  [facility] [phone]   --------------------------------------------------------------------------------   CC: I have kidney stones.  HPI: Joseph Bowen is a 71 year-old male established patient who is here for renal calculi.  The problem is on the right side. She first stated noticing pain on 09/04/2016. This is her first kidney stone. She is not currently having flank pain, back pain, groin pain, nausea, vomiting, fever or chills. She has not caught a stone in her urine strainer since her symptoms began.   She has never had surgical treatment for calculi in the past.   September 04, 2016 scan-13 mm right lower pole stone (scout-yes; SSD 6.5 cm, HU 1267).   He follows up to review KUB and discuss management of kidney stone. Stable on KUB today.     CC: I have blood in my urine.  HPI: She did not see the blood in her urine. She first noticed the blood 10/08/2016. She has not seen blood clots.   She does not have a burning sensation when she urinates. She is not currently having trouble urinating.   Her last U/S or CT Scan was 09/04/2016.   Patient has 3-10 red blood cells on UA today. He's had no gross hematuria. He has significant exposure risks to smoking, solvents and agent orange. He quit smoking about 5 years ago.   He also returns today for cystoscopy.     CC/HPI: He also asked about tamsulosin. We discussed the last time. He has BPH. He voids with a weak stream.     ALLERGIES: None   MEDICATIONS: Crestor  Levothyroxine Sodium  Aspir 81  Fludrocortisone Acetate  Glucophage  Humalog  Januvia  Minipress 1 mg capsule  Nuedexta  Senna   Thiamine Hcl 100 mg tablet  Venlafaxine Hcl Er 150 mg tablet, extended release 24 hr  Vitamin D3  Zoloft 25 mg tablet  Zyprexa     GU PSH: None   NON-GU PSH: Pelvis/hip Joint Surgery    GU PMH: BPH w/LUTS - 10/08/2016 Microscopic hematuria - 10/08/2016 Renal calculus - 10/08/2016 Weak Urinary Stream - 10/08/2016    NON-GU PMH: Arthritis Depression Diabetes Type 2 Glaucoma Hypertension Hypothyroidism    FAMILY HISTORY: 1 son - Other   SOCIAL HISTORY: Marital Status: Divorced Current Smoking Status: Patient does not smoke anymore. Has not smoked since 09/13/2010.   Tobacco Use Assessment Completed: Used Tobacco in last 30 days? Drinks 3 caffeinated drinks per day.    REVIEW OF SYSTEMS:    GU Review Male:   Patient denies hard to postpone urination, get up at night to urinate, stream starts and stops, currently pregnant, have to strain to urinate, frequent urination, trouble starting your stream, leakage of urine, and burning /pain with urination.  Gastrointestinal (Upper):   Patient denies nausea, vomiting, and indigestion/ heartburn.  Gastrointestinal (Lower):   Patient reports diarrhea. Patient denies constipation.  Constitutional:   Patient reports weight loss. Patient denies fever, night sweats, and fatigue.  Skin:   Patient denies skin rash/ lesion and itching.  Eyes:   Patient denies blurred vision and double vision.  Ears/ Nose/ Throat:   Patient denies sore throat and sinus problems.  Hematologic/Lymphatic:  Patient denies swollen glands and easy bruising.  Cardiovascular:   Patient denies leg swelling and chest pains.  Respiratory:   Patient denies cough and shortness of breath.  Endocrine:   Patient denies excessive thirst.  Musculoskeletal:   Patient denies back pain and joint pain.  Neurological:   Patient reports dizziness. Patient denies headaches.  Psychologic:   Patient reports anxiety. Patient denies depression.   VITAL SIGNS:      11/08/2016 01:26 PM   Weight 136 lb / 61.69 kg  Height 68 in / 172.72 cm  BP 130/82 mmHg  Pulse 94 /min  Temperature 98.4 F / 37 C  BMI 20.7 kg/m   GU PHYSICAL EXAMINATION:    Urethral Meatus: Normal size. Normal position. No discharge. Penis circumcised without mass or lesion    MULTI-SYSTEM PHYSICAL EXAMINATION:    Constitutional: Well-nourished. No physical deformities. Normally developed. Good grooming.  Neck: Neck symmetrical, not swollen. Normal tracheal position.  Respiratory: No labored breathing, no use of accessory muscles.   Cardiovascular: Normal temperature, normal extremity pulses, no swelling, no varicosities.  Neurologic / Psychiatric: Oriented to time, oriented to place, oriented to person. No depression, no anxiety, no agitation.     PAST DATA REVIEWED:  Source Of History:  Patient   PROCEDURES:         Flexible Cystoscopy - 52000  Risks, benefits, and some of the potential complications of the procedure were discussed at length with the patient including infection, bleeding, voiding discomfort, urinary retention, fever, chills, sepsis, and others. All questions were answered. Informed consent was obtained. Antibiotic prophylaxis was given. Sterile technique and intraurethral analgesia were used.   Meatus:  Normal size. Normal location. Normal condition.  Urethra:  Normal. No stricture. Prostatic urethra obstructed by a median lobe protruding into the bladder.  Ureteral Orifices:  Normal location. Normal size. Normal shape. Effluxed clear urine.  Bladder:  No trabeculation. No tumors. Normal mucosa. No stones.      The lower urinary tract was carefully examined. The procedure was well-tolerated and without complications. Antibiotic instructions were given. Instructions were given to call the office immediately for bloody urine, difficulty urinating, urinary retention, painful or frequent urination, fever, chills, nausea, vomiting or other illness. The patient stated that she understood  these instructions and would comply with them.         KUB - [id number]  A single view of the abdomen is obtained.  Bony Abnormalities:  none  Fecal Stasis:  normal   Soft Tissue:  normal   Calculi:  13 mm RLP       comparison to prior CT         Urinalysis Dipstick Dipstick Cont'd  Color: Yellow Bilirubin: Neg  Appearance: Clear Ketones: Trace  Specific Gravity: 1.025 Blood: Neg  pH: 6.0 Protein: Trace  Glucose: 3+ Urobilinogen: 0.2    Nitrites: Neg    Leukocyte Esterase: Neg    ASSESSMENT:      ICD-10 Details  1 GU:   Microscopic hematuria - R31.21   2   Renal calculus - N20.0   3   BPH w/LUTS - N40.1   4   Weak Urinary Stream - R39.12    PLAN:            Medications New Meds: Tamsulosin Hcl 0.4 mg capsule, ext release 24 hr 1 capsule PO Q HS   #90  3 Refill(s)            Schedule Return Visit/Planned Activity: Next  Available Appointment - Schedule Surgery          Document Letter(s):  Created for Patient: Clinical Summary         Notes:   Microscopic hematuria-benign evaluation   BPH-tamsulosin prescribed. He would do well with the laser procedure to vaporize the median lobe. He'll consider.   Right lower pole stone-I discussed with him and his caregivers the nature of risks benefits of continued surveillance, ureteroscopy or shockwave lithotripsy. All questions answered and he'll proceed with shockwave lithotripsy. We discussed the relative success rates, complications and need for staged procedures.   cc: Dr. Eden EmmsAriza    * Signed by Jerilee FieldMatthew Eskridge, M.D. on 11/09/16 at 1:19 AM (EDT)*     The information contained in this medical record document is considered private and confidential patient information. This information can only be used for the medical diagnosis and/or medical services that are being provided by the patient's selected caregivers. This information can only be distributed outside of the patient's care if the patient agrees and signs waivers of  authorization for this information to be sent to an outside source or route.

## 2016-11-18 NOTE — Progress Notes (Signed)
Patient back from litho at 1130. VSS. Able to drink coffee and eat crackers. All d/c instructions reviewed with patient and his social worker/guardian (pt is a ward of the state). All d/c instructions were reviewed with Brune[place] Dar[place]issa (Child psychotherapistsocial worker) and written copies to be sent home with patient. Venita SheffieldGladys is caregiver that will ride with patient and  transportation [facility]. Have assisted patient to ambulate to restroom and was unable to urinate. Sitting in recliner, iv fluids running, and will assist patient up to restroom again soon.

## 2016-11-18 NOTE — Op Note (Signed)
See [facility] OP note scanned into chart. 

## 2016-11-19 ENCOUNTER — Encounter (HOSPITAL_COMMUNITY): Payer: Self-pay | Admitting: Urology

## 2016-12-03 ENCOUNTER — Other Ambulatory Visit (HOSPITAL_COMMUNITY): Payer: Self-pay | Admitting: Internal Medicine

## 2016-12-03 DIAGNOSIS — R918 Other nonspecific abnormal finding of lung field: Secondary | ICD-10-CM

## 2016-12-12 ENCOUNTER — Ambulatory Visit (HOSPITAL_COMMUNITY)
Admission: RE | Admit: 2016-12-12 | Discharge: 2016-12-12 | Disposition: A | Discharge status: Home / Self Care | Payer: Medicare Other | Source: Ambulatory Visit | Attending: Internal Medicine | Admitting: Internal Medicine

## 2016-12-12 DIAGNOSIS — K861 Other chronic pancreatitis: Secondary | ICD-10-CM | POA: Insufficient documentation

## 2016-12-12 DIAGNOSIS — I2510 Atherosclerotic heart disease of native coronary artery without angina pectoris: Secondary | ICD-10-CM | POA: Diagnosis not present

## 2016-12-12 DIAGNOSIS — R918 Other nonspecific abnormal finding of lung field: Secondary | ICD-10-CM | POA: Diagnosis present

## 2016-12-12 DIAGNOSIS — I700 Atherosclerosis of aorta: Secondary | ICD-10-CM | POA: Insufficient documentation

## 2016-12-12 DIAGNOSIS — J439 Emphysema, unspecified: Secondary | ICD-10-CM | POA: Insufficient documentation

## 2016-12-12 LAB — GLUCOSE, CAPILLARY: Glucose-Capillary: 236 mg/dL — ABNORMAL HIGH (ref 65–99)

## 2016-12-12 MED ORDER — FLUDEOXYGLUCOSE F - 18 (FDG) INJECTION
6.9400 | Freq: Once | INTRAVENOUS | Status: AC | PRN
  Administered 2016-12-12: 6.94 via INTRAVENOUS

## 2016-12-21 ENCOUNTER — Other Ambulatory Visit
Admission: RE | Admit: 2016-12-21 | Discharge: 2016-12-21 | Disposition: A | Discharge status: Home / Self Care | Payer: Medicare Other | Source: Other Acute Inpatient Hospital | Attending: Internal Medicine | Admitting: Internal Medicine

## 2016-12-21 DIAGNOSIS — Z79899 Other long term (current) drug therapy: Secondary | ICD-10-CM | POA: Insufficient documentation

## 2016-12-21 LAB — BASIC METABOLIC PANEL
Anion gap: 12 (ref 5–15)
BUN: 17 mg/dL (ref 6–20)
CO2: 25 mmol/L (ref 22–32)
Calcium: 9.3 mg/dL (ref 8.9–10.3)
Chloride: 98 mmol/L — ABNORMAL LOW (ref 101–111)
Creatinine, Ser: 0.81 mg/dL (ref 0.61–1.24)
GFR calc Af Amer: >60 mL/min (ref >60)
GFR calc non Af Amer: >60 mL/min (ref >60)
Glucose, Bld: 329 mg/dL — ABNORMAL HIGH (ref 65–99)
Potassium: 4.4 mmol/L (ref 3.5–5.1)
Sodium: 135 mmol/L (ref 135–145)

## 2016-12-21 LAB — CBC
HCT: 35.9 % — ABNORMAL LOW (ref 40.0–52.0)
Hemoglobin: 11.6 g/dL — ABNORMAL LOW (ref 13.0–18.0)
MCH: 28.3 pg (ref 26.0–34.0)
MCHC: 32.4 g/dL (ref 32.0–36.0)
MCV: 87.4 fL (ref 80.0–100.0)
Platelets: 206 10*3/uL (ref 150–440)
RBC: 4.11 MIL/uL — ABNORMAL LOW (ref 4.40–5.90)
RDW: 14.5 % (ref 11.5–14.5)
WBC: 4.5 10*3/uL (ref 3.8–10.6)

## 2016-12-23 LAB — DIFFERENTIAL
Band Neutrophils: 0 %
Basophils Absolute: 0 10*3/uL (ref 0–0.1)
Basophils Relative: 1 %
Blasts: 0 %
Eosinophils Absolute: 0.2 10*3/uL (ref 0–0.7)
Eosinophils Relative: 5 %
Lymphocytes Relative: 32 %
Lymphs Abs: 1.4 10*3/uL (ref 1.0–3.6)
Metamyelocytes Relative: 0 %
Monocytes Absolute: 0.4 10*3/uL (ref 0.2–1.0)
Monocytes Relative: 9 %
Myelocytes: 0 %
Neutro Abs: 2.5 10*3/uL (ref 1.4–6.5)
Neutrophils Relative %: 53 %
Other: 0 %
Promyelocytes Absolute: 0 %
nRBC: 0 /100 WBC

## 2017-01-16 ENCOUNTER — Encounter (HOSPITAL_COMMUNITY): Payer: Self-pay | Admitting: Emergency Medicine

## 2017-01-16 ENCOUNTER — Emergency Department (HOSPITAL_COMMUNITY)
Admission: EM | Admit: 2017-01-16 | Discharge: 2017-01-16 | Disposition: A | Discharge status: Home / Self Care | Payer: Medicare Other | Attending: Emergency Medicine | Admitting: Emergency Medicine

## 2017-01-16 DIAGNOSIS — F02818 Dementia in other diseases classified elsewhere, unspecified severity, with other behavioral disturbance: Secondary | ICD-10-CM

## 2017-01-16 DIAGNOSIS — Z794 Long term (current) use of insulin: Secondary | ICD-10-CM | POA: Diagnosis not present

## 2017-01-16 DIAGNOSIS — F17210 Nicotine dependence, cigarettes, uncomplicated: Secondary | ICD-10-CM | POA: Insufficient documentation

## 2017-01-16 DIAGNOSIS — Z79899 Other long term (current) drug therapy: Secondary | ICD-10-CM | POA: Diagnosis not present

## 2017-01-16 DIAGNOSIS — J449 Chronic obstructive pulmonary disease, unspecified: Secondary | ICD-10-CM | POA: Diagnosis not present

## 2017-01-16 DIAGNOSIS — R4689 Other symptoms and signs involving appearance and behavior: Secondary | ICD-10-CM | POA: Diagnosis not present

## 2017-01-16 DIAGNOSIS — G9349 Other encephalopathy: Secondary | ICD-10-CM | POA: Insufficient documentation

## 2017-01-16 DIAGNOSIS — I10 Essential (primary) hypertension: Secondary | ICD-10-CM | POA: Insufficient documentation

## 2017-01-16 DIAGNOSIS — Z7984 Long term (current) use of oral hypoglycemic drugs: Secondary | ICD-10-CM | POA: Insufficient documentation

## 2017-01-16 DIAGNOSIS — E039 Hypothyroidism, unspecified: Secondary | ICD-10-CM | POA: Diagnosis not present

## 2017-01-16 DIAGNOSIS — E119 Type 2 diabetes mellitus without complications: Secondary | ICD-10-CM | POA: Diagnosis not present

## 2017-01-16 DIAGNOSIS — M79632 Pain in left forearm: Secondary | ICD-10-CM | POA: Insufficient documentation

## 2017-01-16 DIAGNOSIS — Z046 Encounter for general psychiatric examination, requested by authority: Secondary | ICD-10-CM | POA: Diagnosis present

## 2017-01-16 DIAGNOSIS — F0281 Dementia in other diseases classified elsewhere with behavioral disturbance: Secondary | ICD-10-CM | POA: Diagnosis not present

## 2017-01-16 MED ORDER — IBUPROFEN 400 MG PO TABS
600.0000 mg | ORAL_TABLET | Freq: Once | ORAL | Status: AC
  Administered 2017-01-16: 600 mg via ORAL
  Filled 2017-01-16: qty 2

## 2017-01-16 NOTE — Progress Notes (Signed)
LCSW received call regarding consult in the ED that patient was placed under IVC, however does not meet criteria at this time and is ready to DC back to facility.  Discussed case with MD who is in agreement. [facility] to update and make arrangements for his return: message left.  Will follow up regarding discharge plans.  Deretha EmoryHannah Dathan Attia LCSW, MSW Clinical Social Work: Optician, dispensingystem Wide Float Coverage for :  [phone]

## 2017-01-16 NOTE — ED Notes (Signed)
Pt dc with EMS nad. [facility]

## 2017-01-16 NOTE — ED Notes (Signed)
[facility] given to [facility].
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
## 2017-01-16 NOTE — ED Notes (Signed)
Coord[facility] inquiring about pt status.  Informed he was being d/c back to them.  Verbalized understanding.
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
## 2017-01-16 NOTE — ED Notes (Signed)
Patient given coffee at this time. Also gave patient ice pak for soreness in his left forearm. Patient very cooperative and acting age appropriate.

## 2017-01-17 DIAGNOSIS — F03918 Unspecified dementia, unspecified severity, with other behavioral disturbance: Secondary | ICD-10-CM | POA: Diagnosis present

## 2017-01-17 DIAGNOSIS — F0391 Unspecified dementia with behavioral disturbance: Secondary | ICD-10-CM | POA: Diagnosis present

## 2017-01-18 DIAGNOSIS — H409 Unspecified glaucoma: Secondary | ICD-10-CM | POA: Insufficient documentation

## 2017-01-18 DIAGNOSIS — I10 Essential (primary) hypertension: Secondary | ICD-10-CM | POA: Insufficient documentation

## 2017-01-18 DIAGNOSIS — E538 Deficiency of other specified B group vitamins: Secondary | ICD-10-CM | POA: Insufficient documentation

## 2017-01-18 DIAGNOSIS — R911 Solitary pulmonary nodule: Secondary | ICD-10-CM | POA: Insufficient documentation

## 2017-01-18 DIAGNOSIS — N400 Enlarged prostate without lower urinary tract symptoms: Secondary | ICD-10-CM | POA: Insufficient documentation

## 2017-01-18 DIAGNOSIS — E039 Hypothyroidism, unspecified: Secondary | ICD-10-CM | POA: Insufficient documentation

## 2017-01-18 DIAGNOSIS — K861 Other chronic pancreatitis: Secondary | ICD-10-CM | POA: Insufficient documentation

## 2017-01-18 DIAGNOSIS — J449 Chronic obstructive pulmonary disease, unspecified: Secondary | ICD-10-CM | POA: Diagnosis present

## 2017-01-23 DIAGNOSIS — R262 Difficulty in walking, not elsewhere classified: Secondary | ICD-10-CM | POA: Insufficient documentation

## 2017-01-23 DIAGNOSIS — D649 Anemia, unspecified: Secondary | ICD-10-CM | POA: Insufficient documentation

## 2017-02-08 DIAGNOSIS — S20221A Contusion of right back wall of thorax, initial encounter: Secondary | ICD-10-CM | POA: Insufficient documentation

## 2017-02-09 DIAGNOSIS — N433 Hydrocele, unspecified: Secondary | ICD-10-CM

## 2017-02-09 DIAGNOSIS — F1021 Alcohol dependence, in remission: Secondary | ICD-10-CM

## 2017-02-09 DIAGNOSIS — F4310 Post-traumatic stress disorder, unspecified: Secondary | ICD-10-CM | POA: Diagnosis present

## 2017-02-09 DIAGNOSIS — H6121 Impacted cerumen, right ear: Secondary | ICD-10-CM | POA: Insufficient documentation

## 2017-07-28 ENCOUNTER — Ambulatory Visit: Payer: Medicare Other | Admitting: Neurology

## 2017-07-29 ENCOUNTER — Observation Stay (HOSPITAL_COMMUNITY)
Admission: EM | Admit: 2017-07-29 | Discharge: 2017-07-30 | Disposition: A | Discharge status: Home / Self Care | Payer: Medicare Other | Attending: Internal Medicine | Admitting: Internal Medicine

## 2017-07-29 ENCOUNTER — Emergency Department (HOSPITAL_COMMUNITY): Payer: Medicare Other

## 2017-07-29 ENCOUNTER — Encounter (HOSPITAL_COMMUNITY): Payer: Self-pay | Admitting: Cardiology

## 2017-07-29 DIAGNOSIS — E119 Type 2 diabetes mellitus without complications: Secondary | ICD-10-CM | POA: Diagnosis not present

## 2017-07-29 DIAGNOSIS — E039 Hypothyroidism, unspecified: Secondary | ICD-10-CM | POA: Diagnosis not present

## 2017-07-29 DIAGNOSIS — F329 Major depressive disorder, single episode, unspecified: Secondary | ICD-10-CM | POA: Diagnosis not present

## 2017-07-29 DIAGNOSIS — Z794 Long term (current) use of insulin: Secondary | ICD-10-CM | POA: Diagnosis not present

## 2017-07-29 DIAGNOSIS — F03918 Unspecified dementia, unspecified severity, with other behavioral disturbance: Secondary | ICD-10-CM | POA: Diagnosis present

## 2017-07-29 DIAGNOSIS — K529 Noninfective gastroenteritis and colitis, unspecified: Principal | ICD-10-CM | POA: Insufficient documentation

## 2017-07-29 DIAGNOSIS — F17210 Nicotine dependence, cigarettes, uncomplicated: Secondary | ICD-10-CM | POA: Diagnosis not present

## 2017-07-29 DIAGNOSIS — F0390 Unspecified dementia without behavioral disturbance: Secondary | ICD-10-CM | POA: Insufficient documentation

## 2017-07-29 DIAGNOSIS — E118 Type 2 diabetes mellitus with unspecified complications: Secondary | ICD-10-CM

## 2017-07-29 DIAGNOSIS — I10 Essential (primary) hypertension: Secondary | ICD-10-CM | POA: Diagnosis not present

## 2017-07-29 DIAGNOSIS — K861 Other chronic pancreatitis: Secondary | ICD-10-CM | POA: Insufficient documentation

## 2017-07-29 DIAGNOSIS — R1030 Lower abdominal pain, unspecified: Secondary | ICD-10-CM | POA: Diagnosis present

## 2017-07-29 DIAGNOSIS — E1165 Type 2 diabetes mellitus with hyperglycemia: Secondary | ICD-10-CM

## 2017-07-29 DIAGNOSIS — J449 Chronic obstructive pulmonary disease, unspecified: Secondary | ICD-10-CM | POA: Insufficient documentation

## 2017-07-29 DIAGNOSIS — K5901 Slow transit constipation: Secondary | ICD-10-CM | POA: Diagnosis present

## 2017-07-29 DIAGNOSIS — Z79899 Other long term (current) drug therapy: Secondary | ICD-10-CM | POA: Diagnosis not present

## 2017-07-29 DIAGNOSIS — F0391 Unspecified dementia with behavioral disturbance: Secondary | ICD-10-CM | POA: Diagnosis present

## 2017-07-29 LAB — CBC WITH DIFFERENTIAL/PLATELET
Basophils Absolute: 0 10*3/uL (ref 0.0–0.1)
Basophils Relative: 0 %
Eosinophils Absolute: 0.2 10*3/uL (ref 0.0–0.7)
Eosinophils Relative: 2 %
HCT: 37.1 % — ABNORMAL LOW (ref 39.0–52.0)
Hemoglobin: 11.8 g/dL — ABNORMAL LOW (ref 13.0–17.0)
Lymphocytes Relative: 18 %
Lymphs Abs: 1.6 10*3/uL (ref 0.7–4.0)
MCH: 28.4 pg (ref 26.0–34.0)
MCHC: 31.8 g/dL (ref 30.0–36.0)
MCV: 89.2 fL (ref 78.0–100.0)
Monocytes Absolute: 1 10*3/uL (ref 0.1–1.0)
Monocytes Relative: 12 %
Neutro Abs: 5.8 10*3/uL (ref 1.7–7.7)
Neutrophils Relative %: 68 %
Platelets: 192 10*3/uL (ref 150–400)
RBC: 4.16 MIL/uL — ABNORMAL LOW (ref 4.22–5.81)
RDW: 15.3 % (ref 11.5–15.5)
WBC: 8.6 10*3/uL (ref 4.0–10.5)

## 2017-07-29 LAB — COMPREHENSIVE METABOLIC PANEL
ALT: 12 U/L — ABNORMAL LOW (ref 17–63)
AST: 26 U/L (ref 15–41)
Albumin: 3 g/dL — ABNORMAL LOW (ref 3.5–5.0)
Alkaline Phosphatase: 47 U/L (ref 38–126)
Anion gap: 16 — ABNORMAL HIGH (ref 5–15)
BUN: 30 mg/dL — ABNORMAL HIGH (ref 6–20)
CO2: 25 mmol/L (ref 22–32)
Calcium: 8.7 mg/dL — ABNORMAL LOW (ref 8.9–10.3)
Chloride: 96 mmol/L — ABNORMAL LOW (ref 101–111)
Creatinine, Ser: 1.16 mg/dL (ref 0.61–1.24)
GFR calc Af Amer: >60 mL/min (ref >60)
GFR calc non Af Amer: >60 mL/min (ref >60)
Glucose, Bld: 46 mg/dL — ABNORMAL LOW (ref 65–99)
Potassium: 3.5 mmol/L (ref 3.5–5.1)
Sodium: 137 mmol/L (ref 135–145)
Total Bilirubin: 0.4 mg/dL (ref 0.3–1.2)
Total Protein: 6.2 g/dL — ABNORMAL LOW (ref 6.5–8.1)

## 2017-07-29 LAB — URINALYSIS, ROUTINE W REFLEX MICROSCOPIC
Bilirubin Urine: NEGATIVE
Glucose, UA: 50 mg/dL — AB
Hgb urine dipstick: NEGATIVE
Ketones, ur: NEGATIVE mg/dL
Leukocytes, UA: NEGATIVE
Nitrite: NEGATIVE
Protein, ur: NEGATIVE mg/dL
Specific Gravity, Urine: >1.046 — ABNORMAL HIGH (ref 1.005–1.030)
pH: 5 (ref 5.0–8.0)

## 2017-07-29 LAB — LACTIC ACID, PLASMA
Lactic Acid, Venous: 3.1 mmol/L (ref 0.5–1.9)
Lactic Acid, Venous: 1.5 mmol/L (ref 0.5–1.9)

## 2017-07-29 LAB — LIPASE, BLOOD: Lipase: 16 U/L (ref 11–51)

## 2017-07-29 LAB — CBG MONITORING, ED
Glucose-Capillary: 190 mg/dL — ABNORMAL HIGH (ref 65–99)
Glucose-Capillary: 191 mg/dL — ABNORMAL HIGH (ref 65–99)

## 2017-07-29 MED ORDER — CIPROFLOXACIN IN D5W 400 MG/200ML IV SOLN
400.0000 mg | Freq: Once | INTRAVENOUS | Status: AC
  Administered 2017-07-29: 400 mg via INTRAVENOUS
  Filled 2017-07-29: qty 200

## 2017-07-29 MED ORDER — SERTRALINE HCL 50 MG PO TABS
50.0000 mg | ORAL_TABLET | Freq: Every day | ORAL | Status: DC
  Administered 2017-07-30: 50 mg via ORAL
  Filled 2017-07-29: qty 1

## 2017-07-29 MED ORDER — GABAPENTIN 400 MG PO CAPS
400.0000 mg | ORAL_CAPSULE | Freq: Three times a day (TID) | ORAL | Status: DC
  Administered 2017-07-30 (×2): 400 mg via ORAL
  Filled 2017-07-29 (×2): qty 1

## 2017-07-29 MED ORDER — ACETAMINOPHEN 325 MG PO TABS: 650.0000 mg | ORAL_TABLET | Freq: Four times a day (QID) | ORAL | Status: DC | PRN

## 2017-07-29 MED ORDER — SODIUM CHLORIDE 0.9 % IV BOLUS (SEPSIS)
1000.0000 mL | Freq: Once | INTRAVENOUS | Status: AC
  Administered 2017-07-30: 1000 mL via INTRAVENOUS

## 2017-07-29 MED ORDER — VENLAFAXINE HCL ER 37.5 MG PO CP24
37.5000 mg | ORAL_CAPSULE | ORAL | Status: DC
  Filled 2017-07-29: qty 1

## 2017-07-29 MED ORDER — TRAZODONE HCL 50 MG PO TABS
50.0000 mg | ORAL_TABLET | Freq: Every day | ORAL | Status: DC
  Administered 2017-07-30: 50 mg via ORAL
  Filled 2017-07-29: qty 1

## 2017-07-29 MED ORDER — DEXTROSE 50 % IV SOLN
1.0000 | Freq: Once | INTRAVENOUS | Status: AC
  Administered 2017-07-29: 50 mL via INTRAVENOUS

## 2017-07-29 MED ORDER — ONDANSETRON HCL 4 MG PO TABS: 4.0000 mg | ORAL_TABLET | Freq: Four times a day (QID) | ORAL | Status: DC | PRN

## 2017-07-29 MED ORDER — TAMSULOSIN HCL 0.4 MG PO CAPS
0.4000 mg | ORAL_CAPSULE | Freq: Every day | ORAL | Status: DC
  Administered 2017-07-30: 0.4 mg via ORAL
  Filled 2017-07-29: qty 1

## 2017-07-29 MED ORDER — OLANZAPINE 5 MG PO TABS: 2.5000 mg | ORAL_TABLET | Freq: Two times a day (BID) | ORAL | Status: DC

## 2017-07-29 MED ORDER — DIVALPROEX SODIUM 125 MG PO CSDR
250.0000 mg | DELAYED_RELEASE_CAPSULE | Freq: Two times a day (BID) | ORAL | Status: DC
  Administered 2017-07-30 (×2): 250 mg via ORAL
  Filled 2017-07-29 (×8): qty 2

## 2017-07-29 MED ORDER — INSULIN ASPART 100 UNIT/ML ~~LOC~~ SOLN
0.0000 [IU] | Freq: Four times a day (QID) | SUBCUTANEOUS | Status: DC
  Administered 2017-07-30: 1 [IU] via SUBCUTANEOUS

## 2017-07-29 MED ORDER — DIVALPROEX SODIUM 125 MG PO CSDR
125.0000 mg | DELAYED_RELEASE_CAPSULE | Freq: Every day | ORAL | Status: DC
  Administered 2017-07-30: 125 mg via ORAL
  Filled 2017-07-29 (×3): qty 1

## 2017-07-29 MED ORDER — LORAZEPAM 0.5 MG PO TABS: 0.5000 mg | ORAL_TABLET | Freq: Every day | ORAL | Status: DC | PRN

## 2017-07-29 MED ORDER — POLYVINYL ALCOHOL 1.4 % OP SOLN
1.0000 [drp] | Freq: Every day | OPHTHALMIC | Status: DC
  Filled 2017-07-29: qty 15

## 2017-07-29 MED ORDER — OLANZAPINE 5 MG PO TABS
5.0000 mg | ORAL_TABLET | Freq: Every day | ORAL | Status: DC
  Administered 2017-07-30: 5 mg via ORAL
  Filled 2017-07-29: qty 1

## 2017-07-29 MED ORDER — OLANZAPINE 5 MG PO TABS
2.5000 mg | ORAL_TABLET | Freq: Every day | ORAL | Status: DC
  Administered 2017-07-30: 2.5 mg via ORAL
  Filled 2017-07-29: qty 1

## 2017-07-29 MED ORDER — VITAMIN B-1 100 MG PO TABS
100.0000 mg | ORAL_TABLET | Freq: Every day | ORAL | Status: DC
  Administered 2017-07-30: 100 mg via ORAL
  Filled 2017-07-29: qty 1

## 2017-07-29 MED ORDER — ATORVASTATIN CALCIUM 40 MG PO TABS
40.0000 mg | ORAL_TABLET | Freq: Every day | ORAL | Status: DC
  Administered 2017-07-30: 40 mg via ORAL
  Filled 2017-07-29: qty 1

## 2017-07-29 MED ORDER — METRONIDAZOLE IN NACL 5-0.79 MG/ML-% IV SOLN
500.0000 mg | Freq: Once | INTRAVENOUS | Status: AC
  Administered 2017-07-30: 500 mg via INTRAVENOUS

## 2017-07-29 MED ORDER — PRAZOSIN HCL 1 MG PO CAPS
1.0000 mg | ORAL_CAPSULE | Freq: Every day | ORAL | Status: DC
  Administered 2017-07-30: 1 mg via ORAL
  Filled 2017-07-29 (×4): qty 1

## 2017-07-29 MED ORDER — IOPAMIDOL (ISOVUE-300) INJECTION 61%
100.0000 mL | Freq: Once | INTRAVENOUS | Status: AC | PRN
  Administered 2017-07-29: 100 mL via INTRAVENOUS

## 2017-07-29 MED ORDER — MEMANTINE HCL 10 MG PO TABS
5.0000 mg | ORAL_TABLET | Freq: Two times a day (BID) | ORAL | Status: DC
  Administered 2017-07-30 (×2): 5 mg via ORAL
  Filled 2017-07-29 (×2): qty 1

## 2017-07-29 MED ORDER — DEXTROSE 50 % IV SOLN
INTRAVENOUS | Status: AC
  Filled 2017-07-29: qty 50

## 2017-07-29 MED ORDER — SODIUM CHLORIDE 0.9 % IV BOLUS (SEPSIS)
500.0000 mL | Freq: Once | INTRAVENOUS | Status: AC
  Administered 2017-07-29: 500 mL via INTRAVENOUS

## 2017-07-29 MED ORDER — METRONIDAZOLE IN NACL 5-0.79 MG/ML-% IV SOLN
500.0000 mg | Freq: Once | INTRAVENOUS | Status: DC
  Filled 2017-07-29: qty 100

## 2017-07-29 MED ORDER — CIPROFLOXACIN IN D5W 400 MG/200ML IV SOLN
400.0000 mg | Freq: Two times a day (BID) | INTRAVENOUS | Status: DC
  Administered 2017-07-30: 400 mg via INTRAVENOUS
  Filled 2017-07-29: qty 200

## 2017-07-29 MED ORDER — LEVOTHYROXINE SODIUM 50 MCG PO TABS
62.5000 ug | ORAL_TABLET | Freq: Every day | ORAL | Status: DC
  Administered 2017-07-30: 62.5 ug via ORAL
  Filled 2017-07-29: qty 1

## 2017-07-29 MED ORDER — METRONIDAZOLE IN NACL 5-0.79 MG/ML-% IV SOLN
500.0000 mg | Freq: Three times a day (TID) | INTRAVENOUS | Status: DC
  Administered 2017-07-30: 500 mg via INTRAVENOUS
  Filled 2017-07-29: qty 100

## 2017-07-29 MED ORDER — ACETAMINOPHEN 650 MG RE SUPP: 650.0000 mg | Freq: Four times a day (QID) | RECTAL | Status: DC | PRN

## 2017-07-29 MED ORDER — ENOXAPARIN SODIUM 40 MG/0.4ML ~~LOC~~ SOLN
40.0000 mg | SUBCUTANEOUS | Status: DC
  Administered 2017-07-30: 40 mg via SUBCUTANEOUS
  Filled 2017-07-29: qty 0.4

## 2017-07-29 MED ORDER — FLUDROCORTISONE ACETATE 0.1 MG PO TABS
0.1000 mg | ORAL_TABLET | Freq: Every day | ORAL | Status: DC
  Administered 2017-07-30: 0.1 mg via ORAL
  Filled 2017-07-29 (×3): qty 1

## 2017-07-29 MED ORDER — DIVALPROEX SODIUM 125 MG PO CSDR: 125.0000 mg | DELAYED_RELEASE_CAPSULE | Freq: Three times a day (TID) | ORAL | Status: DC

## 2017-07-29 MED ORDER — INSULIN GLARGINE 100 UNIT/ML ~~LOC~~ SOLN
18.0000 [IU] | Freq: Every day | SUBCUTANEOUS | Status: DC
  Filled 2017-07-29: qty 0.18

## 2017-07-29 MED ORDER — SODIUM CHLORIDE 0.9 % IV SOLN: INTRAVENOUS | Status: DC

## 2017-07-29 MED ORDER — ONDANSETRON HCL 4 MG/2ML IJ SOLN: 4.0000 mg | Freq: Four times a day (QID) | INTRAMUSCULAR | Status: DC | PRN

## 2017-07-29 NOTE — H&P (Addendum)
TRH H&P    Patient Demographics:    Joseph Bowen, is a 72 y.o. male  MRN: [id number]  DOB - 05-09-1946  Admit Date - 07/29/2017  Referring MD/NP/PA: Dr. Adriana Simas  Outpatient Primary MD for the patient is Colon Branch, MD  Patient coming from: home Chief Complaint  Patient presents with  . Abdominal Pain      HPI:    Joseph Bowen  is a 72 y.o. male, with history of dementia, BPH, chronic pancreatitis, hypertension, depression, orthostatic hypotension, insulin-dependent diabetes mellitus came to hospital with chief complaint of abdominal pain for past two days. Pain radiates to bilateral flanks, he denies nausea and vomiting. Denies diarrhea. Had last bowel movement yesterday morning. Denies blood in the stool denies chest pain or shortness of breath. Denies dysuria, urgency.  In the ED, CT of the abdomen and pelvis was done which revealed wall thickening in the distal descending and sigmoid colon suggestive of colitis. Patient also had episode of hypoglycemia in the ED and was given one ampule of D50. Patient started on ciprofloxacin and Flagyl.    Review of systems:     All other systems reviewed and are negative.   With Past History of the following :    Past Medical History:  Diagnosis Date  . Alcohol-induced persisting amnestic disorder (HCC)   . Anemia   . Anxiety   . Atherosclerosis of arteries   . Benign prostatic hypertrophy   . Chronic pancreatitis (HCC)   . Constipation   . COPD (chronic obstructive pulmonary disease) (HCC)   . Dementia   . Dementia    due to encephalopathy from diabetic coma 2011.  . Depression   . Difficulty walking   . Dizziness and giddiness   . 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  . Essential hypertension   . Euthyroid sick syndrome   . Glaucoma   . H/O ETOH abuse   . History of falling   . Hydrocele   . Hypoglycemia   . Hypothyroidism   .  Insomnia   . Legally blind   . Orthostatic hypotension   . Pain in left shoulder   . Personal history of alcoholism (HCC)   . Post traumatic stress disorder   . Psychosis (HCC)   . Radiculopathy, cervical region   . Retinopathy   . Sleep terrors (night terrors)   . Syncope and collapse   . Type 2 diabetes mellitus (HCC)   . Urinary incontinence   . Vitamin B deficiency   . Vitamin D deficiency       Past Surgical History:  Procedure Laterality Date  . BACK SURGERY     removal of melanoma of back.  . COLONOSCOPY WITH PROPOFOL N/A 08/15/2015   Dr. Darrick Penna: left colon redundant, pediatric scope used, sigmoid colon woult not adequately insufflate, moderate diverticulosis throughout entire colon, moderate sized IH   . EXTRACORPOREAL SHOCK WAVE LITHOTRIPSY Right 11/18/2016   Procedure: RIGHT EXTRACORPOREAL SHOCK WAVE LITHOTRIPSY (ESWL);  Surgeon: Barron Alvine, MD;  Location: [facility];  Service:  Urology;  Laterality: Right;  . HAND SURGERY Right    middle finger; fractue  . HEMORRHOID SURGERY    . HERNIA REPAIR Left    inguinal  . HIP SURGERY Bilateral    bilateral hip fractures  . MOHS SURGERY     Melanoma      Social History:      Social History   Tobacco Use  . Smoking status: Current Some Day Smoker    Packs/day: 1.50    Years: 48.00    Pack years: 72.00    Types: Cigarettes    Start date: 09/17/1961    Last attempt to quit: 09/17/2009    Years since quitting: 7.8  . Smokeless tobacco: Never Used  Substance Use Topics  . Alcohol use: No    Alcohol/week: 0.0 oz    Comment: hx of alcoholism-last used 2011       Family History :     Family History  Problem Relation Age of Onset  . Heart disease Unknown   . Diabetes Unknown       Home Medications:   Prior to Admission medications   Medication Sig Start Date End Date Taking? Authorizing Provider  atorvastatin (LIPITOR) 40 MG tablet Take 40 mg by mouth daily.   Yes [provider]  bisacodyl  (DULCOLAX) 10 MG suppository Place 10 mg rectally as needed for moderate constipation.   Yes [provider]  cyanocobalamin 1000 MCG tablet Take 1,000 mcg by mouth daily.   Yes [provider]  Dextromethorphan-Quinidine (NUEDEXTA) 20-10 MG CAPS Take 1 capsule by mouth 2 (two) times daily.    Yes [provider]  divalproex (DEPAKOTE SPRINKLE) 125 MG capsule Take 125-250 mg by mouth 3 (three) times daily. Take 250mg  twice daily and take 125mg  daily at noon   Yes [provider]  docusate sodium (COLACE) 100 MG capsule Take 100 mg by mouth daily as needed for mild constipation.   Yes [provider]  fludrocortisone (FLORINEF) 0.1 MG tablet Take 1 tablet (0.1 mg total) by mouth daily. 08/15/16  Yes Jonelle SidleMcDowell, Samuel G, MD  gabapentin (NEURONTIN) 400 MG capsule Take 400 mg by mouth 3 (three) times daily.   Yes [provider]  guaifenesin (ROBITUSSIN) 100 MG/5ML syrup Take 200 mg by mouth every 4 (four) hours as needed for cough.   Yes [provider]  ibuprofen (ADVIL,MOTRIN) 200 MG tablet Take 400 mg by mouth every 6 (six) hours as needed.   Yes [provider]  insulin glargine (LANTUS) 100 UNIT/ML injection Inject 0.09 mLs (9 Units total) into the skin daily. Patient taking differently: Inject 18 Units into the skin daily.  09/10/16  Yes Filbert SchilderKadolph, Alexandria U, MD  insulin lispro (HUMALOG) 100 UNIT/ML injection Inject 4 Units into the skin 2 (two) times daily. If blood sugar is >200.   Yes [provider]  levothyroxine (SYNTHROID, LEVOTHROID) 125 MCG tablet Take 62.5 mcg by mouth daily before breakfast.   Yes [provider]  lipase/protease/amylase (CREON) 36000 UNITS CPEP capsule Take 1 capsule (36,000 Units total) by mouth 3 (three) times daily with meals. 09/10/16  Yes Filbert SchilderKadolph, Alexandria U, MD  LORazepam (ATIVAN) 0.5 MG tablet Take one tablet by mouth three times daily; Take one tablet by mouth twice daily as  needed for breakthrough anxiety Patient taking differently: Take 0.5 mg by mouth daily as needed for anxiety.  05/03/13  Yes Sharon SellerEubanks, Jessica K, NP  Melatonin 1 MG TABS Take 6 mg by mouth  at bedtime.    Yes [provider]  memantine (NAMENDA) 5 MG tablet Take 5 mg by mouth 2 (two) times daily.   Yes [provider]  metFORMIN (GLUCOPHAGE) 1000 MG tablet Take 1,000 mg by mouth 2 (two) times daily with a meal.   Yes [provider]  OLANZapine (ZYPREXA) 2.5 MG tablet Take 2.5-5 mg by mouth 2 (two) times daily. 2.5mg  in the afternoon and 5mg  at bedtime   Yes [provider]  prazosin (MINIPRESS) 2 MG capsule Take 1 mg by mouth at bedtime.    Yes [provider]  Propylene Glycol (SYSTANE BALANCE) 0.6 % SOLN Place 1 drop into both eyes at bedtime.   Yes [provider]  sennosides-docusate sodium (SENOKOT-S) 8.6-50 MG tablet Take 1 tablet by mouth 2 (two) times daily.   Yes [provider]  sertraline (ZOLOFT) 50 MG tablet Take 50 mg by mouth daily.    Yes [provider]  sitaGLIPtin (JANUVIA) 50 MG tablet Take 50 mg by mouth daily.   Yes [provider]  tamsulosin (FLOMAX) 0.4 MG CAPS capsule Take 0.4 mg by mouth at bedtime.    Yes [provider]  thiamine 100 MG tablet Take 100 mg by mouth daily.   Yes [provider]  traZODone (DESYREL) 50 MG tablet Take 50 mg by mouth at bedtime.   Yes [provider]  venlafaxine XR (EFFEXOR-XR) 37.5 MG 24 hr capsule Take 37.5 mg by mouth every other day.   Yes [provider]  ergocalciferol (VITAMIN D2) 50000 units capsule Take 50,000 Units by mouth every 30 (thirty) days.    [provider]     Allergies:    No Known Allergies   Physical Exam:   Vitals  Blood pressure 132/84, pulse 95, temperature 98.3 F (36.8 C), temperature source Oral, resp. rate 19, height 5\' 8"  (1.727 m), weight 68 kg (150 lb), SpO2 98 %.  1.   General: appears in no acute distress  2. Psychiatric:  Intact judgement and  insight, awake alert, oriented x 3.  3. Neurologic: No focal neurological deficits, all cranial nerves intact.Strength 5/5 all 4 extremities, sensation intact all 4 extremities, plantars down going.  4. Eyes :  anicteric sclerae, moist conjunctivae with no lid lag. PERRLA.  5. ENMT:  Oropharynx clear with moist mucous membranes and good dentition  6. Neck:  supple, no cervical lymphadenopathy appriciated, No thyromegaly  7. Respiratory : Normal respiratory effort, good air movement bilaterally,clear to  auscultation bilaterally  8. Cardiovascular : RRR, no gallops, rubs or murmurs, no leg edema  9. Gastrointestinal:  Positive bowel sounds, abdomen soft, generalized tenderness to palpation, mild guarding no rigidity. No organomegaly palpated  10. Skin:  No cyanosis, normal texture and turgor, no rash, lesions or ulcers  11.Musculoskeletal:  Good muscle tone,  joints appear normal , no effusions,  normal range of motion    Data Review:    CBC Recent Labs  Lab 07/29/17 1618  WBC 8.6  HGB 11.8*  HCT 37.1*  PLT 192  MCV 89.2  MCH 28.4  MCHC 31.8  RDW 15.3  LYMPHSABS 1.6  MONOABS 1.0  EOSABS 0.2  BASOSABS 0.0   ------------------------------------------------------------------------------------------------------------------  Chemistries  Recent Labs  Lab 07/29/17 1618  NA 137  K 3.5  CL 96*  CO2 25  GLUCOSE 46*  BUN 30*  CREATININE 1.16  CALCIUM 8.7*  AST 26  ALT 12*  ALKPHOS 47  BILITOT 0.4   ------------------------------------------------------------------------------------------------------------------  ------------------------------------------------------------------------------------------------------------------  GFR: Estimated Creatinine Clearance: 57 mL/min (by C-G formula based on SCr of 1.16 mg/dL). Liver Function Tests: Recent Labs  Lab 07/29/17 1618    AST 26  ALT 12*  ALKPHOS 47  BILITOT 0.4  PROT 6.2*  ALBUMIN 3.0*   Recent Labs  Lab 07/29/17 1618  LIPASE 16   No results for input(s): AMMONIA in the last 168 hours. CBG: Recent Labs  Lab 07/29/17 1838 07/29/17 1916  GLUCAP 190* 191*    --------------------------------------------------------------------------------------------------------------- Urine analysis:    Component Value Date/Time   COLORURINE YELLOW 07/29/2017 1611   APPEARANCEUR CLEAR 07/29/2017 1611   LABSPEC >1.046 (H) 07/29/2017 1611   PHURINE 5.0 07/29/2017 1611   GLUCOSEU 50 (A) 07/29/2017 1611   HGBUR NEGATIVE 07/29/2017 1611   BILIRUBINUR NEGATIVE 07/29/2017 1611   KETONESUR NEGATIVE 07/29/2017 1611   PROTEINUR NEGATIVE 07/29/2017 1611   UROBILINOGEN 0.2 01/22/2015 1000   NITRITE NEGATIVE 07/29/2017 1611   LEUKOCYTESUR NEGATIVE 07/29/2017 1611      Imaging Results:    Ct Abdomen Pelvis W Contrast  Result Date: 07/29/2017 CLINICAL DATA:  Abdomen pain for 3 weeks EXAM: CT ABDOMEN AND PELVIS WITH CONTRAST TECHNIQUE: Multidetector CT imaging of the abdomen and pelvis was performed using the standard protocol following bolus administration of intravenous contrast. CONTRAST:  ISOVUE-300 IOPAMIDOL (ISOVUE-300) INJECTION 61% COMPARISON:  PET CT 12/12/2016, CT abdomen pelvis 09/04/2016 FINDINGS: Lower chest: Small right-sided pleural effusion and trace left pleural effusion. Emphysema at the bases with large bulla. No acute consolidation. Borderline heart size. Small pericardial effusion. Hepatobiliary: No focal liver abnormality is seen. No gallstones, gallbladder wall thickening, or biliary dilatation. Pancreas: Atrophic pancreas. Multiple calcifications consistent with chronic pancreatitis. Ductal enlargement. Spleen: Normal in size without focal abnormality. Adrenals/Urinary Tract: Adrenal glands are within normal limits. Cyst in the upper pole of the left kidney. Bladder within normal limits.  Stomach/Bowel: Stomach is nonenlarged. No dilated small bowel. Slightly enlarged gas and stool-filled colon with wall thickening at the descending colon and irregular wall thickening at the sigmoid:. There is adjacent fluid and edema in the pericolonic fat. No intramural air. Negative appendix Vascular/Lymphatic: Extensive aortic atherosclerosis without aneurysmal enlargement. Suspected moderate stenosis right common iliac artery. No significantly enlarged lymph nodes. Coils near the pancreas. Reproductive: Stable enlargement of prostate with masslike enlargement anteriorly and mass effect on the posterior bladder Other: Negative for free air. Small amount of free fluid in the lower quadrants. Mild fluid present within the right anterior pararenal space. Musculoskeletal: Stable mild superior endplate deformity at L1. No suspicious lesion IMPRESSION: 1. Wall thickening at the distal descending and sigmoid colon with surrounding fluid and edema, suspicious for colitis of infectious, inflammatory or ischemic etiology. Some sigmoid colon diverticular present and diverticulitis is possible but given length of bowel involvement, colitis is favored. Follow-up colonoscopy suggested after resolution of acute symptoms to exclude colon mass. Moderate gaseous enlargement and increased stool within the colon upstream to the area of inflammation. 2. Small amount of free fluid in the abdomen. There is some fluid within the right anterior pararenal space which may be seen with pancreatitis and there are findings that are consistent with chronic pancreatitis, suggest correlation with enzymes 3. Enlarged prostate with anterior masslike enlargement 4. Small pleural effusions.  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 Small pericardial effusion 6. Findings consistent with chronic Electronically Signed   By: Jasmine Pang M.D.   On: 07/29/2017 18:47    My personal review of EKG: Rhythm NSR   Assessment &  Plan:    Active Problems:    Type 2 diabetes mellitus (HCC)   Colitis   Dementia without behavioral disturbance   1. Colitis-CT of the abdomen shows wall thickening of distal descending and sigmoid colon with surrounding fluid and edema suspicious for colitis, differentials including infectious/inflammatory/ischemic patient empirically started on ciprofloxacin and Flagyl.. Will keep on NPO. Continue supportive care with IV normal saline at hundred ML per hour. Will check lactic acid. 2. Diabetes mellitus- one episode of hypoglycemia in the ED, will start Lantus scheduled 18 units sub Q daily from tomorrow morning. Patient is currently NPO. Start sliding scale insulin with NovoLog, check CABG Q6 hours. 3. Dementia-without behavioral disturbance, continue home medications Zyprexa, Namenda. 4. Depression-continue venlafaxine, trazodone, Zoloft. 5. Chronic pancreatitis -patient takes pancreatic enzymes, Creon at home. Will hold Creon at this time as patient is NPO. 6. Hypothyroidism- continue Synthroid 7. Orthostatic hypotension- continue Florinef   DVT Prophylaxis-   Lovenox  AM Labs Ordered, also please review Full Orders  Family Communication: Admission, patients condition and plan of care including tests being ordered have been discussed with the patient  who indicate understanding and agree with the plan and Code Status.  Code Status: full code  Admission status: observation  Time spent in minutes : 60 minutes   Meredeth Ide M.D on 07/29/2017 at 7:57 PM  Between 7am to 7pm - Pager - [phone]. After 7pm go to [email or web address] - password [facility]  [facility] - Office  [phone]

## 2017-07-29 NOTE — Progress Notes (Signed)
CRITICAL VALUE ALERT  Critical Value:  Lactic Acid 3.1  Date & Time Notied:  07/29/17 2106   Provider Notified: Clearence PedSchorr, NP  Orders Received/Actions taken: New orders given.

## 2017-07-29 NOTE — ED Notes (Signed)
Pt was told that we need a urine sample. Pt said that he will go in a little.

## 2017-07-29 NOTE — ED Triage Notes (Signed)
Abdominal pain times 3 weeks.  [facility]

## 2017-07-29 NOTE — ED Provider Notes (Signed)
[facility] EMERGENCY DEPARTMENT Provider Note   CSN: [id number] Arrival date & time: 07/29/17  1405     History   Chief Complaint Chief Complaint  Patient presents with  . Abdominal Pain    HPI Joseph Bowen is a 72 y.o. male.  Level 5 caveat for dementia.  Patient presents with lower abdominal pain radiating to the bilat flank for unknown length of time.  He claims that he is eating.  No fever, sweats, chills, nausea, vomiting, diarrhea.      Past Medical History:  Diagnosis Date  . Alcohol-induced persisting amnestic disorder (HCC)   . Anemia   . Anxiety   . Atherosclerosis of arteries   . Benign prostatic hypertrophy   . Chronic pancreatitis (HCC)   . Constipation   . COPD (chronic obstructive pulmonary disease) (HCC)   . Dementia   . Dementia    due to encephalopathy from diabetic coma 2011.  . Depression   . Difficulty walking   . Dizziness and giddiness   . 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  . Essential hypertension   . Euthyroid sick syndrome   . Glaucoma   . H/O ETOH abuse   . History of falling   . Hydrocele   . Hypoglycemia   . Hypothyroidism   . Insomnia   . Legally blind   . Orthostatic hypotension   . Pain in left shoulder   . Personal history of alcoholism (HCC)   . Post traumatic stress disorder   . Psychosis (HCC)   . Radiculopathy, cervical region   . Retinopathy   . Sleep terrors (night terrors)   . Syncope and collapse   . Type 2 diabetes mellitus (HCC)   . Urinary incontinence   . Vitamin B deficiency   . Vitamin D deficiency     Patient Active Problem List   Diagnosis Date Noted  . Hydrocele   . Colitis 09/04/2016  . Dementia without behavioral disturbance   . Personal history of alcoholism (HCC)   . Post traumatic stress disorder   . Chronic obstructive pulmonary disease (HCC)   . Left cervical radiculopathy 07/26/2016  . Closed head injury 10/20/2015  . Fall 10/20/2015  . Heme + stool 07/27/2015    . 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 Memory loss 10/31/2014  . Dizziness 07/04/2014  . Syncope 07/04/2014  . Type 2 diabetes mellitus (HCC) 07/04/2014  . Fracture, pelvis closed (HCC) 12/01/2013  . Proximal phalanx fracture of finger 12/01/2013  . Fx metacarpal neck-closed 12/01/2013  . Pelvis fracture (HCC) 11/10/2012  . Pelvic fracture (HCC) 09/23/2012  . Dislocation of PIP joint of finger 09/23/2012  . Dupuytren's disease of palm 09/02/2012    Past Surgical History:  Procedure Laterality Date  . BACK SURGERY     removal of melanoma of back.  . COLONOSCOPY WITH PROPOFOL N/A 08/15/2015   Dr. Darrick Penna: left colon redundant, pediatric scope used, sigmoid colon woult not adequately insufflate, moderate diverticulosis throughout entire colon, moderate sized IH   . EXTRACORPOREAL SHOCK WAVE LITHOTRIPSY Right 11/18/2016   Procedure: RIGHT EXTRACORPOREAL SHOCK WAVE LITHOTRIPSY (ESWL);  Surgeon: Barron Alvine, MD;  Location: [facility];  Service: Urology;  Laterality: Right;  . HAND SURGERY Right    middle finger; fractue  . HEMORRHOID SURGERY    . HERNIA REPAIR Left    inguinal  . HIP SURGERY Bilateral    bilateral hip fractures  . MOHS SURGERY     Melanoma  Home Medications    Prior to Admission medications   Medication Sig Start Date End Date Taking? Authorizing Provider  atorvastatin (LIPITOR) 40 MG tablet Take 40 mg by mouth daily.   Yes [provider]  bisacodyl (DULCOLAX) 10 MG suppository Place 10 mg rectally as needed for moderate constipation.   Yes [provider]  cyanocobalamin 1000 MCG tablet Take 1,000 mcg by mouth daily.   Yes [provider]  Dextromethorphan-Quinidine (NUEDEXTA) 20-10 MG CAPS Take 1 capsule by mouth 2 (two) times daily.    Yes [provider]  divalproex (DEPAKOTE SPRINKLE) 125 MG capsule Take 125-250 mg by mouth 3 (three) times daily. Take 250mg  twice daily and take 125mg  daily at noon    Yes [provider]  docusate sodium (COLACE) 100 MG capsule Take 100 mg by mouth daily as needed for mild constipation.   Yes [provider]  fludrocortisone (FLORINEF) 0.1 MG tablet Take 1 tablet (0.1 mg total) by mouth daily. 08/15/16  Yes Jonelle Sidle, MD  gabapentin (NEURONTIN) 400 MG capsule Take 400 mg by mouth 3 (three) times daily.   Yes [provider]  guaifenesin (ROBITUSSIN) 100 MG/5ML syrup Take 200 mg by mouth every 4 (four) hours as needed for cough.   Yes [provider]  ibuprofen (ADVIL,MOTRIN) 200 MG tablet Take 400 mg by mouth every 6 (six) hours as needed.   Yes [provider]  insulin glargine (LANTUS) 100 UNIT/ML injection Inject 0.09 mLs (9 Units total) into the skin daily. Patient taking differently: Inject 18 Units into the skin daily.  09/10/16  Yes Filbert Schilder, MD  insulin lispro (HUMALOG) 100 UNIT/ML injection Inject 4 Units into the skin 2 (two) times daily. If blood sugar is >200.   Yes [provider]  levothyroxine (SYNTHROID, LEVOTHROID) 125 MCG tablet Take 62.5 mcg by mouth daily before breakfast.   Yes [provider]  lipase/protease/amylase (CREON) 36000 UNITS CPEP capsule Take 1 capsule (36,000 Units total) by mouth 3 (three) times daily with meals. 09/10/16  Yes Filbert Schilder, MD  LORazepam (ATIVAN) 0.5 MG tablet Take one tablet by mouth three times daily; Take one tablet by mouth twice daily as needed for breakthrough anxiety Patient taking differently: Take 0.5 mg by mouth daily as needed for anxiety.  05/03/13  Yes Sharon Seller, NP  Melatonin 1 MG TABS Take 6 mg by mouth at bedtime.    Yes [provider]  memantine (NAMENDA) 5 MG tablet Take 5 mg by mouth 2 (two) times daily.   Yes [provider]  metFORMIN (GLUCOPHAGE) 1000 MG tablet Take 1,000 mg by mouth 2 (two) times daily with a meal.   Yes [provider]  OLANZapine (ZYPREXA) 2.5  MG tablet Take 2.5-5 mg by mouth 2 (two) times daily. 2.5mg  in the afternoon and 5mg  at bedtime   Yes [provider]  prazosin (MINIPRESS) 2 MG capsule Take 1 mg by mouth at bedtime.    Yes [provider]  Propylene Glycol (SYSTANE BALANCE) 0.6 % SOLN Place 1 drop into both eyes at bedtime.   Yes [provider]  sennosides-docusate sodium (SENOKOT-S) 8.6-50 MG tablet Take 1 tablet by mouth 2 (two) times daily.   Yes [provider]  sertraline (ZOLOFT) 50 MG tablet Take 50 mg by mouth daily.    Yes [provider]  sitaGLIPtin (JANUVIA) 50 MG tablet Take 50 mg by mouth daily.   Yes [provider]  tamsulosin (FLOMAX) 0.4 MG CAPS capsule Take 0.4 mg by mouth at bedtime.    Yes [provider]  thiamine 100 MG tablet Take 100 mg by mouth daily.   Yes [provider]  traZODone (DESYREL) 50 MG tablet Take 50 mg by mouth at bedtime.   Yes [provider]  venlafaxine XR (EFFEXOR-XR) 37.5 MG 24 hr capsule Take 37.5 mg by mouth every other day.   Yes [provider]  ergocalciferol (VITAMIN D2) 50000 units capsule Take 50,000 Units by mouth every 30 (thirty) days.    [provider]    Family History Family History  Problem Relation Age of Onset  . Heart disease Unknown   . Diabetes Unknown     Social History Social History   Tobacco Use  . Smoking status: Current Some Day Smoker    Packs/day: 1.50    Years: 48.00    Pack years: 72.00    Types: Cigarettes    Start date: 09/17/1961    Last attempt to quit: 09/17/2009    Years since quitting: 7.8  . Smokeless tobacco: Never Used  Substance Use Topics  . Alcohol use: No    Alcohol/week: 0.0 oz    Comment: hx of alcoholism-last used 2011  . Drug use: No     Allergies   Patient has no known allergies.   Review of Systems Review of Systems  All other systems reviewed and are negative.    Physical Exam Updated Vital Signs BP  121/75 (BP Location: Right Arm)   Pulse 90   Temp 98.3 F (36.8 C) (Oral)   Resp 19   Ht 5\' 8"  (1.727 m)   Wt 68 kg (150 lb)   SpO2 96%   BMI 22.81 kg/m   Physical Exam  Constitutional: He is oriented to person, place, and time. He appears well-developed and well-nourished.  HENT:  Head: Normocephalic and atraumatic.  Eyes: Conjunctivae are normal.  Neck: Neck supple.  Cardiovascular: Normal rate and regular rhythm.  Pulmonary/Chest: Effort normal and breath sounds normal.  Abdominal: Soft. Bowel sounds are normal.  Minimal lower abdominal tenderness  Musculoskeletal: Normal range of motion.  Neurological: He is alert and oriented to person, place, and time.  Skin: Skin is warm and dry.  Psychiatric: He has a normal mood and affect. His behavior is normal.  Nursing note and vitals reviewed.    ED Treatments / Results  Labs (all labs ordered are listed, but only abnormal results are displayed) Labs Reviewed  CBC WITH DIFFERENTIAL/PLATELET - Abnormal; Notable for the following components:      Result Value   RBC 4.16 (*)    Hemoglobin 11.8 (*)    HCT 37.1 (*)    All other components within normal limits  COMPREHENSIVE METABOLIC PANEL - Abnormal; Notable for the following components:   Chloride 96 (*)    Glucose, Bld 46 (*)    BUN 30 (*)    Calcium 8.7 (*)    Total Protein 6.2 (*)    Albumin 3.0 (*)    ALT 12 (*)    Anion gap 16 (*)    All other components within normal limits  URINALYSIS, ROUTINE W REFLEX MICROSCOPIC - Abnormal; Notable for the following components:   Specific Gravity, Urine >1.046 (*)    Glucose, UA 50 (*)    All other components within normal limits  CBG MONITORING, ED - Abnormal; Notable for the following components:   Glucose-Capillary 190 (*)  All other components within normal limits  LIPASE, BLOOD    EKG  EKG Interpretation None       Radiology Ct Abdomen Pelvis W Contrast  Result Date: 07/29/2017 CLINICAL DATA:  Abdomen  pain for 3 weeks EXAM: CT ABDOMEN AND PELVIS WITH CONTRAST TECHNIQUE: Multidetector CT imaging of the abdomen and pelvis was performed using the standard protocol following bolus administration of intravenous contrast. CONTRAST:  ISOVUE-300 IOPAMIDOL (ISOVUE-300) INJECTION 61% COMPARISON:  PET CT 12/12/2016, CT abdomen pelvis 09/04/2016 FINDINGS: Lower chest: Small right-sided pleural effusion and trace left pleural effusion. Emphysema at the bases with large bulla. No acute consolidation. Borderline heart size. Small pericardial effusion. Hepatobiliary: No focal liver abnormality is seen. No gallstones, gallbladder wall thickening, or biliary dilatation. Pancreas: Atrophic pancreas. Multiple calcifications consistent with chronic pancreatitis. Ductal enlargement. Spleen: Normal in size without focal abnormality. Adrenals/Urinary Tract: Adrenal glands are within normal limits. Cyst in the upper pole of the left kidney. Bladder within normal limits. Stomach/Bowel: Stomach is nonenlarged. No dilated small bowel. Slightly enlarged gas and stool-filled colon with wall thickening at the descending colon and irregular wall thickening at the sigmoid:. There is adjacent fluid and edema in the pericolonic fat. No intramural air. Negative appendix Vascular/Lymphatic: Extensive aortic atherosclerosis without aneurysmal enlargement. Suspected moderate stenosis right common iliac artery. No significantly enlarged lymph nodes. Coils near the pancreas. Reproductive: Stable enlargement of prostate with masslike enlargement anteriorly and mass effect on the posterior bladder Other: Negative for free air. Small amount of free fluid in the lower quadrants. Mild fluid present within the right anterior pararenal space. Musculoskeletal: Stable mild superior endplate deformity at L1. No suspicious lesion IMPRESSION: 1. Wall thickening at the distal descending and sigmoid colon with surrounding fluid and edema, suspicious for colitis  of infectious, inflammatory or ischemic etiology. Some sigmoid colon diverticular present and diverticulitis is possible but given length of bowel involvement, colitis is favored. Follow-up colonoscopy suggested after resolution of acute symptoms to exclude colon mass. Moderate gaseous enlargement and increased stool within the colon upstream to the area of inflammation. 2. Small amount of free fluid in the abdomen. There is some fluid within the right anterior pararenal space which may be seen with pancreatitis and there are findings that are consistent with chronic pancreatitis, suggest correlation with enzymes 3. Enlarged prostate with anterior masslike enlargement 4. Small pleural effusions.  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 Small pericardial effusion 6. Findings consistent with chronic Electronically Signed   By: Jasmine Pang M.D.   On: 07/29/2017 18:47    Procedures Procedures (including critical care time)  Medications Ordered in ED Medications  dextrose 50 % solution (not administered)  metroNIDAZOLE (FLAGYL) IVPB 500 mg (not administered)  ciprofloxacin (CIPRO) IVPB 400 mg (not administered)  sodium chloride 0.9 % bolus 500 mL (0 mLs Intravenous Stopped 07/29/17 1733)  dextrose 50 % solution 50 mL (50 mLs Intravenous Given 07/29/17 1732)  iopamidol (ISOVUE-300) 61 % injection 100 mL (100 mLs Intravenous Contrast Given 07/29/17 1745)     Initial Impression / Assessment and Plan / ED Course  I have reviewed the triage vital signs and the nursing notes.  Pertinent labs & imaging results that were available during my care of the patient were reviewed by me and considered in my medical decision making (see chart for details).     Patient presents with lower abdominal pain.  CT scan of abdomen and pelvis reveal wall thickening in the distal descending and sigmoid colon suggestive of colitis.  He also had an episode of hypoglycemia in the ED.  IV Cipro, IV Flagyl, admit to general  medicine.  Final Clinical Impressions(s) / ED Diagnoses   Final diagnoses:  Colitis    ED Discharge Orders    None       Donnetta Hutching, MD 07/29/17 1901

## 2017-07-29 NOTE — Progress Notes (Signed)
Pharmacy Antibiotic Note  Joseph BernhardtDavid Andrew Bowen is a 72 y.o. male admitted on 07/29/2017 with abdominal infection.  Pharmacy has been consulted for CIPRO dosing.  Plan: Cipro 400mg  IV q12hrs Monitor labs, progress, c/s  Height: 5\' 8"  (172.7 cm) Weight: 143 lb 6.4 oz (65 kg) IBW/kg (Calculated) : 68.4  Temp (24hrs), Avg:98.4 F (36.9 C), Min:98.3 F (36.8 C), Max:98.5 F (36.9 C)  Recent Labs  Lab 07/29/17 1618 07/29/17 2015  WBC 8.6  --   CREATININE 1.16  --   LATICACIDVEN  --  3.1*    Estimated Creatinine Clearance: 54.5 mL/min (by C-G formula based on SCr of 1.16 mg/dL).    No Known Allergies  Antimicrobials this admission: Cipro 1/15 >>  Flagyl 1/15 >>   Dose adjustments this admission:  Microbiology results:  BCx: pending  UCx:    Sputum:    MRSA PCR:   Thank you for allowing pharmacy to be a part of this patient's care.  Valrie HartHall, Elayah Klooster A 07/29/2017 9:25 PM

## 2017-07-30 ENCOUNTER — Other Ambulatory Visit: Payer: Self-pay

## 2017-07-30 DIAGNOSIS — F0390 Unspecified dementia without behavioral disturbance: Secondary | ICD-10-CM

## 2017-07-30 DIAGNOSIS — K529 Noninfective gastroenteritis and colitis, unspecified: Secondary | ICD-10-CM | POA: Diagnosis not present

## 2017-07-30 LAB — COMPREHENSIVE METABOLIC PANEL
ALT: 9 U/L — ABNORMAL LOW (ref 17–63)
AST: 11 U/L — ABNORMAL LOW (ref 15–41)
Albumin: 2.7 g/dL — ABNORMAL LOW (ref 3.5–5.0)
Alkaline Phosphatase: 41 U/L (ref 38–126)
Anion gap: 8 (ref 5–15)
BUN: 22 mg/dL — ABNORMAL HIGH (ref 6–20)
CO2: 28 mmol/L (ref 22–32)
Calcium: 7.8 mg/dL — ABNORMAL LOW (ref 8.9–10.3)
Chloride: 101 mmol/L (ref 101–111)
Creatinine, Ser: 0.73 mg/dL (ref 0.61–1.24)
GFR calc Af Amer: >60 mL/min (ref >60)
GFR calc non Af Amer: >60 mL/min (ref >60)
Glucose, Bld: 129 mg/dL — ABNORMAL HIGH (ref 65–99)
Potassium: 3 mmol/L — ABNORMAL LOW (ref 3.5–5.1)
Sodium: 137 mmol/L (ref 135–145)
Total Bilirubin: 0.4 mg/dL (ref 0.3–1.2)
Total Protein: 5.4 g/dL — ABNORMAL LOW (ref 6.5–8.1)

## 2017-07-30 LAB — HEMOGLOBIN A1C
Hgb A1c MFr Bld: 9.1 % — ABNORMAL HIGH (ref 4.8–5.6)
Mean Plasma Glucose: 214.47 mg/dL

## 2017-07-30 LAB — GLUCOSE, CAPILLARY
Glucose-Capillary: 202 mg/dL — ABNORMAL HIGH (ref 65–99)
Glucose-Capillary: 39 mg/dL — CL (ref 65–99)
Glucose-Capillary: 124 mg/dL — ABNORMAL HIGH (ref 65–99)
Glucose-Capillary: 124 mg/dL — ABNORMAL HIGH (ref 65–99)

## 2017-07-30 LAB — CBC
HCT: 34.2 % — ABNORMAL LOW (ref 39.0–52.0)
Hemoglobin: 11 g/dL — ABNORMAL LOW (ref 13.0–17.0)
MCH: 28.5 pg (ref 26.0–34.0)
MCHC: 32.2 g/dL (ref 30.0–36.0)
MCV: 88.6 fL (ref 78.0–100.0)
Platelets: 172 10*3/uL (ref 150–400)
RBC: 3.86 MIL/uL — ABNORMAL LOW (ref 4.22–5.81)
RDW: 15.3 % (ref 11.5–15.5)
WBC: 8 10*3/uL (ref 4.0–10.5)

## 2017-07-30 LAB — LACTIC ACID, PLASMA: Lactic Acid, Venous: 0.9 mmol/L (ref 0.5–1.9)

## 2017-07-30 MED ORDER — INSULIN GLARGINE 100 UNIT/ML ~~LOC~~ SOLN: 9.0000 [IU] | Freq: Every day | SUBCUTANEOUS | 11 refills | Status: DC

## 2017-07-30 MED ORDER — DEXTROSE-NACL 5-0.45 % IV SOLN
INTRAVENOUS | Status: DC
  Administered 2017-07-30: 03:00:00 via INTRAVENOUS
  Filled 2017-07-30 (×2): qty 1000

## 2017-07-30 MED ORDER — DEXTROSE-NACL 5-0.9 % IV SOLN: INTRAVENOUS | Status: DC

## 2017-07-30 MED ORDER — CIPROFLOXACIN HCL 250 MG PO TABS: 250.0000 mg | ORAL_TABLET | Freq: Two times a day (BID) | ORAL | 0 refills | Status: AC

## 2017-07-30 MED ORDER — DEXTROSE 50 % IV SOLN
INTRAVENOUS | Status: AC
  Administered 2017-07-30: 50 mL
  Filled 2017-07-30: qty 50

## 2017-07-30 MED ORDER — METRONIDAZOLE 500 MG PO TABS: 500.0000 mg | ORAL_TABLET | Freq: Three times a day (TID) | ORAL | 0 refills | Status: AC

## 2017-07-30 MED ORDER — DEXTROSE-NACL 5-0.45 % IV SOLN
INTRAVENOUS | Status: DC
  Administered 2017-07-30: 09:00:00 via INTRAVENOUS

## 2017-07-30 NOTE — Discharge Summary (Signed)
Physician Discharge Summary  Joseph BernhardtDavid Andrew Bowen ZOX:[id number] DOB: 09/24/1945 DOA: 07/29/2017  PCP: Joseph BranchQureshi, Ayyaz, MD  Admit date: 07/29/2017 Discharge date: 07/30/2017  Time spent: 45 minutes  Recommendations for Outpatient Follow-up:  -To be discharged back to skilled nursing facility today. -We will complete 14-day course of Cipro/Flagyl for what is presumed to be infectious colitis.  Discharge Diagnoses:  Active Problems:   Type 2 diabetes mellitus (HCC)   Colitis   Dementia without behavioral disturbance   Discharge Condition: Stable and improved  Filed Weights   07/29/17 1409 07/29/17 2115  Weight: 68 kg (150 lb) 65 kg (143 lb 6.4 oz)    History of present illness:  As per Dr. Sharl MaLama on 1/15: Joseph MiniumDavid Bowen  is a 72 y.o. male, with history of dementia, BPH, chronic pancreatitis, hypertension, depression, orthostatic hypotension, insulin-dependent diabetes mellitus came to hospital with chief complaint of abdominal pain for past two days. Pain radiates to bilateral flanks, he denies nausea and vomiting. Denies diarrhea. Had last bowel movement yesterday morning. Denies blood in the stool denies chest pain or shortness of breath. Denies dysuria, urgency.  In the ED, CT of the abdomen and pelvis was done which revealed wall thickening in the distal descending and sigmoid Joseph suggestive of colitis. Patient also had episode of hypoglycemia in the ED and was given one ampule of D50. Patient started on ciprofloxacin and Flagyl.    Hospital Course:   Abdominal pain -Presumably due to colitis that is seen of distal descending and sigmoid Joseph on CT of the abdomen. -This is presumed to be infectious given rapid resolution on antibiotics. -I would continue Cipro Flagyl for 14 days upon discharge. -Patient has had no further abdominal pain, diarrhea.  Is tolerating solid diet.  Diabetes mellitus -Had an episode of hypoglycemia in the ED that corrected quickly with  D50. -Have decreased Lantus from 18-9 units.  No NovoLog for now, continue metformin. -CBGs will need to be closely monitored at SNF for further dosing adjustments.  Dementia -Without behavioral disturbances. -Continue Namenda, Zyprexa.  Depression -Continue venlafaxine, trazodone, Zoloft.  Chronic pancreatitis -Continue Creon.  Hypothyroidism -Continue Synthroid.  Chronic orthostatic hypotension -Continue Florinef  Procedures:  None  Consultations:  None  Discharge Instructions  Discharge Instructions    Diet - low sodium heart healthy   Complete by:  As directed    Increase activity slowly   Complete by:  As directed      Allergies as of 07/30/2017   No Known Allergies     Medication List    STOP taking these medications   insulin lispro 100 UNIT/ML injection Commonly known as:  HUMALOG     TAKE these medications   atorvastatin 40 MG tablet Commonly known as:  LIPITOR Take 40 mg by mouth daily.   bisacodyl 10 MG suppository Commonly known as:  DULCOLAX Place 10 mg rectally as needed for moderate constipation.   ciprofloxacin 250 MG tablet Commonly known as:  CIPRO Take 1 tablet (250 mg total) by mouth 2 (two) times daily for 14 days.   cyanocobalamin 1000 MCG tablet Take 1,000 mcg by mouth daily.   divalproex 125 MG capsule Commonly known as:  DEPAKOTE SPRINKLE Take 125-250 mg by mouth 3 (three) times daily. Take 250mg  twice daily and take 125mg  daily at noon   docusate sodium 100 MG capsule Commonly known as:  COLACE Take 100 mg by mouth daily as needed for mild constipation.   ergocalciferol 50000 units capsule Commonly known  as:  VITAMIN D2 Take 50,000 Units by mouth every 30 (thirty) days.   fludrocortisone 0.1 MG tablet Commonly known as:  FLORINEF Take 1 tablet (0.1 mg total) by mouth daily.   gabapentin 400 MG capsule Commonly known as:  NEURONTIN Take 400 mg by mouth 3 (three) times daily.   guaifenesin 100 MG/5ML  syrup Commonly known as:  ROBITUSSIN Take 200 mg by mouth every 4 (four) hours as needed for cough.   ibuprofen 200 MG tablet Commonly known as:  ADVIL,MOTRIN Take 400 mg by mouth every 6 (six) hours as needed.   insulin glargine 100 UNIT/ML injection Commonly known as:  LANTUS Inject 0.09 mLs (9 Units total) into the skin daily. What changed:  how much to take   levothyroxine 125 MCG tablet Commonly known as:  SYNTHROID, LEVOTHROID Take 62.5 mcg by mouth daily before breakfast.   lipase/protease/amylase 78295 UNITS Cpep capsule Commonly known as:  CREON Take 1 capsule (36,000 Units total) by mouth 3 (three) times daily with meals.   LORazepam 0.5 MG tablet Commonly known as:  ATIVAN Take one tablet by mouth three times daily; Take one tablet by mouth twice daily as needed for breakthrough anxiety What changed:    how much to take  how to take this  when to take this  reasons to take this  additional instructions   Melatonin 1 MG Tabs Take 6 mg by mouth at bedtime.   memantine 5 MG tablet Commonly known as:  NAMENDA Take 5 mg by mouth 2 (two) times daily.   metFORMIN 1000 MG tablet Commonly known as:  GLUCOPHAGE Take 1,000 mg by mouth 2 (two) times daily with a meal.   metroNIDAZOLE 500 MG tablet Commonly known as:  FLAGYL Take 1 tablet (500 mg total) by mouth 3 (three) times daily for 14 days.   NUEDEXTA 20-10 MG Caps Generic drug:  Dextromethorphan-Quinidine Take 1 capsule by mouth 2 (two) times daily.   OLANZapine 2.5 MG tablet Commonly known as:  ZYPREXA Take 2.5-5 mg by mouth 2 (two) times daily. 2.5mg  in the afternoon and 5mg  at bedtime   prazosin 2 MG capsule Commonly known as:  MINIPRESS Take 1 mg by mouth at bedtime.   sennosides-docusate sodium 8.6-50 MG tablet Commonly known as:  SENOKOT-S Take 1 tablet by mouth 2 (two) times daily.   sertraline 50 MG tablet Commonly known as:  ZOLOFT Take 50 mg by mouth daily.   sitaGLIPtin 50 MG  tablet Commonly known as:  JANUVIA Take 50 mg by mouth daily.   SYSTANE BALANCE 0.6 % Soln Generic drug:  Propylene Glycol Place 1 drop into both eyes at bedtime.   tamsulosin 0.4 MG Caps capsule Commonly known as:  FLOMAX Take 0.4 mg by mouth at bedtime.   thiamine 100 MG tablet Take 100 mg by mouth daily.   traZODone 50 MG tablet Commonly known as:  DESYREL Take 50 mg by mouth at bedtime.   venlafaxine XR 37.5 MG 24 hr capsule Commonly known as:  EFFEXOR-XR Take 37.5 mg by mouth every other day.      No Known Allergies  Contact information for follow-up providers    Joseph Branch, MD. Schedule an appointment as soon as possible for a visit in 2 week(s).   Specialty:  Internal Medicine Contact information: [address] [place] [address] [phone]            Contact information for after-discharge care    Destination  [name] Follow up.   Service:  Skilled Nursing Contact information: [address] [place] [address] [phone]                   The results of significant diagnostics from this hospitalization (including imaging, microbiology, ancillary and laboratory) are listed below for reference.    Significant Diagnostic Studies: Ct Abdomen Pelvis W Contrast  Result Date: 07/29/2017 CLINICAL DATA:  Abdomen pain for 3 weeks EXAM: CT ABDOMEN AND PELVIS WITH CONTRAST TECHNIQUE: Multidetector CT imaging of the abdomen and pelvis was performed using the standard protocol following bolus administration of intravenous contrast. CONTRAST:  ISOVUE-300 IOPAMIDOL (ISOVUE-300) INJECTION 61% COMPARISON:  PET CT 12/12/2016, CT abdomen pelvis 09/04/2016 FINDINGS: Lower chest: Small right-sided pleural effusion and trace left pleural effusion. Emphysema at the bases with large bulla. No acute consolidation. Borderline heart size. Small pericardial effusion. Hepatobiliary: No focal liver abnormality is seen. No  gallstones, gallbladder wall thickening, or biliary dilatation. Pancreas: Atrophic pancreas. Multiple calcifications consistent with chronic pancreatitis. Ductal enlargement. Spleen: Normal in size without focal abnormality. Adrenals/Urinary Tract: Adrenal glands are within normal limits. Cyst in the upper pole of the left kidney. Bladder within normal limits. Stomach/Bowel: Stomach is nonenlarged. No dilated small bowel. Slightly enlarged gas and stool-filled Joseph with wall thickening at the descending Joseph and irregular wall thickening at the sigmoid:. There is adjacent fluid and edema in the pericolonic fat. No intramural air. Negative appendix Vascular/Lymphatic: Extensive aortic atherosclerosis without aneurysmal enlargement. Suspected moderate stenosis right common iliac artery. No significantly enlarged lymph nodes. Coils near the pancreas. Reproductive: Stable enlargement of prostate with masslike enlargement anteriorly and mass effect on the posterior bladder Other: Negative for free air. Small amount of free fluid in the lower quadrants. Mild fluid present within the right anterior pararenal space. Musculoskeletal: Stable mild superior endplate deformity at L1. No suspicious lesion IMPRESSION: 1. Wall thickening at the distal descending and sigmoid Joseph with surrounding fluid and edema, suspicious for colitis of infectious, inflammatory or ischemic etiology. Some sigmoid Joseph diverticular present and diverticulitis is possible but given length of bowel involvement, colitis is favored. Follow-up colonoscopy suggested after resolution of acute symptoms to exclude Joseph mass. Moderate gaseous enlargement and increased stool within the Joseph upstream to the area of inflammation. 2. Small amount of free fluid in the abdomen. There is some fluid within the right anterior pararenal space which may be seen with pancreatitis and there are findings that are consistent with chronic pancreatitis, suggest  correlation with enzymes 3. Enlarged prostate with anterior masslike enlargement 4. Small pleural effusions.  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 Small pericardial effusion 6. Findings consistent with chronic Electronically Signed   By: Jasmine Pang M.D.   On: 07/29/2017 18:47    Microbiology: No results found for this or any previous visit (from the past 240 hour(s)).   Labs: Basic Metabolic Panel: Recent Labs  Lab 07/29/17 1618 07/30/17 0229  NA 137 137  K 3.5 3.0*  CL 96* 101  CO2 25 28  GLUCOSE 46* 129*  BUN 30* 22*  CREATININE 1.16 0.73  CALCIUM 8.7* 7.8*   Liver Function Tests: Recent Labs  Lab 07/29/17 1618 07/30/17 0229  AST 26 11*  ALT 12* 9*  ALKPHOS 47 41  BILITOT 0.4 0.4  PROT 6.2* 5.4*  ALBUMIN 3.0* 2.7*   Recent Labs  Lab 07/29/17 1618  LIPASE 16   No results for input(s): AMMONIA in the last 168 hours. CBC:  Recent Labs  Lab 07/29/17 1618 07/30/17 0229  WBC 8.6 8.0  NEUTROABS 5.8  --   HGB 11.8* 11.0*  HCT 37.1* 34.2*  MCV 89.2 88.6  PLT 192 172   Cardiac Enzymes: No results for input(s): CKTOTAL, CKMB, CKMBINDEX, TROPONINI in the last 168 hours. BNP: BNP (last 3 results) No results for input(s): BNP in the last 8760 hours.  ProBNP (last 3 results) No results for input(s): PROBNP in the last 8760 hours.  CBG: Recent Labs  Lab 07/29/17 1916 07/30/17 0101 07/30/17 0123 07/30/17 0655 07/30/17 0752  GLUCAP 191* 39* 202* 124* 124*       Signed:  Chaya Jan  [facility] Pager: [phone] 07/30/2017, 11:52 AM

## 2017-07-30 NOTE — Clinical Social Work Note (Signed)
Facility notified of discharge and discharge clinicals sent. Transport arranged via [facility].  LCSW signing off.

## 2017-07-30 NOTE — Clinical Social Work Note (Signed)
Clinical Social Work Assessment  Patient Details  Name: Joseph Bowen MRN: [id number] Date of Birth: 05/24/1946  Date of referral:  07/30/17               Reason for consult:  Other (Comment Required)(From [facility])                Permission sought to share information with:    Permission granted to share information::     Name::        Agency::     Relationship::     Contact Information:     Housing/Transportation Living arrangements for the past 2 months:  [facility] Source of Information:  Facility Patient Interpreter Needed:  None Criminal Activity/Legal Involvement Pertinent to Current Situation/Hospitalization:  No - Comment as needed Significant Relationships:  Siblings, Other(Comment)(Joseph Bowen, [facility] is his guardian.) Lives with:  Facility Resident Do you feel safe going back to the place where you live?  Yes Need for family participation in patient care:  Yes (Comment)  Care giving concerns:  None identified. Facility resident.   Social Worker assessment / plan: Patient is a long term [facility] at [facility].  He is in a private room due to difficulty getting along with others. He ambulates independently and completes ADLs with supervision. [place] [address] [place] [facility] and Barnie AldermanMelissa Bowen is his guardian. LCSW attempted to contact guardian multiple times, however no answer was received and no receptionist picked up the phone (a message came on saying the [employer] customer is unavailable).   Employment status:  Retired Database administratornsurance information:  Managed Medicare PT Recommendations:  Not assessed at this time Information / Referral to community resources:     Patient/Family's Response to care:  Contact with guardian has been attempted but has not been made.   Patient/Family's Understanding of and Emotional Response to Diagnosis, Current Treatment, and Prognosis:  Contact was attempted with guardian.   Emotional  Assessment Appearance:  Appears stated age Attitude/Demeanor/Rapport:    Affect (typically observed):  Accepting, Calm, Pleasant Orientation:  Oriented to Self Alcohol / Substance use:  Not Applicable Psych involvement (Current and /or in the community):  No (Comment)  Discharge Needs  Concerns to be addressed:  Discharge Planning Concerns Readmission within the last 30 days:  No Current discharge risk:  None Barriers to Discharge:  No Barriers Identified   Annice NeedySettle, [name] D, LCSW 07/30/2017, 11:16 AM

## 2017-07-30 NOTE — Progress Notes (Signed)
Hypoglycemic Event  CBG: 39  Treatment: D50 IV 50 mL  Symptoms: Cold, pale, hungry  Follow-up CBG: Time:0120 CBG Result:202  Possible Reasons for Event: Inadequate meal intake  Comments/MD notified:Lama, MD text paged for notification.    Marigene EhlersNicole J Londen Lorge

## 2017-07-30 NOTE — NC FL2 (Signed)
[place] MEDICAID FL2 LEVEL OF CARE SCREENING TOOL     IDENTIFICATION  Patient Name: Joseph Bowen Birthdate: 04-29-1946 Sex: male Admission Date (Current Location): 07/29/2017  [facility] and [place] Number:  [employer] and Address:  [facility],  [address], [place] [address]      Provider Number: [id number]  Attending Physician Name and Address:  Philip Aspen, Minerva Ends*  Relative Name and Phone Number:       Current Level of Care: SNF Recommended Level of Care: Skilled Nursing Facility Prior Approval Number:    Date Approved/Denied:   PASRR Number:    Discharge Plan: SNF    Current Diagnoses: Patient Active Problem List   Diagnosis Date Noted  . Hydrocele   . Colitis 09/04/2016  . Dementia without behavioral disturbance   . Personal history of alcoholism (HCC)   . Post traumatic stress disorder   . Chronic obstructive pulmonary disease (HCC)   . Left cervical radiculopathy 07/26/2016  . Closed head injury 10/20/2015  . Fall 10/20/2015  . Heme + stool 07/27/2015  . 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 Memory loss 10/31/2014  . Dizziness 07/04/2014  . Syncope 07/04/2014  . Type 2 diabetes mellitus (HCC) 07/04/2014  . Fracture, pelvis closed (HCC) 12/01/2013  . Proximal phalanx fracture of finger 12/01/2013  . Fx metacarpal neck-closed 12/01/2013  . Pelvis fracture (HCC) 11/10/2012  . Pelvic fracture (HCC) 09/23/2012  . Dislocation of PIP joint of finger 09/23/2012  . Dupuytren's disease of palm 09/02/2012    Orientation RESPIRATION BLADDER Height & Weight     Self  Normal Continent Weight: 143 lb 6.4 oz (65 kg) Height:  5\' 8"  (172.7 cm)  BEHAVIORAL SYMPTOMS/MOOD NEUROLOGICAL BOWEL NUTRITION STATUS      Continent Diet(see discharge summary)  AMBULATORY STATUS COMMUNICATION OF NEEDS Skin   Independent Verbally Normal                       Personal Care Assistance Level of Assistance   Bathing, Feeding, Dressing Bathing Assistance: Independent(with supervision) Feeding assistance: Independent Dressing Assistance: Independent(with supervision)     Functional Limitations Info             SPECIAL CARE FACTORS FREQUENCY                       Contractures Contractures Info: Not present    Additional Factors Info  Code Status, Psychotropic Code Status Info: Full code   Psychotropic Info: Depakote, Ativan         Current Medications (07/30/2017):  This is the current hospital active medication list Current Facility-Administered Medications  Medication Dose Route Frequency Provider Last Rate Last Dose  . acetaminophen (TYLENOL) tablet 650 mg  650 mg Oral Q6H PRN Meredeth Ide, MD       Or  . acetaminophen (TYLENOL) suppository 650 mg  650 mg Rectal Q6H PRN Meredeth Ide, MD      . atorvastatin (LIPITOR) tablet 40 mg  40 mg Oral q1800 Meredeth Ide, MD   40 mg at 07/30/17 0021  . ciprofloxacin (CIPRO) IVPB 400 mg  400 mg Intravenous Q12H Meredeth Ide, MD   Stopped at 07/30/17 [phone]  . dextrose 5 %-0.45 % sodium chloride infusion   Intravenous Continuous Philip Aspen, Limmie Patricia, MD 100 mL/hr at 07/30/17 [phone]    . divalproex (DEPAKOTE SPRINKLE) capsule 250 mg  250 mg Oral BID AC & HS  Philip AspenHernandez Acosta, Limmie PatriciaEstela Y, MD   250 mg at 07/30/17 0919   And  . divalproex (DEPAKOTE SPRINKLE) capsule 125 mg  125 mg Oral Q1200 Philip AspenHernandez Acosta, Limmie PatriciaEstela Y, MD      . enoxaparin (LOVENOX) injection 40 mg  40 mg Subcutaneous Q24H Meredeth IdeLama, Gagan S, MD   40 mg at 07/30/17 0035  . fludrocortisone (FLORINEF) tablet 0.1 mg  0.1 mg Oral Daily [place], Sarina IllGagan S, MD      . gabapentin (NEURONTIN) capsule 400 mg  400 mg Oral TID Meredeth IdeLama, Gagan S, MD   400 mg at 07/30/17 0914  . insulin aspart (novoLOG) injection 0-9 Units  0-9 Units Subcutaneous Q6H Lama, Sarina IllGagan S, MD      . levothyroxine (SYNTHROID, LEVOTHROID) tablet 62.5 mcg  62.5 mcg Oral QAC breakfast Meredeth IdeLama, Gagan S, MD   62.5 mcg at 07/30/17  0914  . LORazepam (ATIVAN) tablet 0.5 mg  0.5 mg Oral Daily PRN Meredeth IdeLama, Gagan S, MD      . memantine [facility](NAMENDA) tablet 5 mg  5 mg Oral BID Meredeth IdeLama, Gagan S, MD   5 mg at 07/30/17 0916  . metroNIDAZOLE (FLAGYL) IVPB 500 mg  500 mg Intravenous Q8H Meredeth IdeLama, Gagan S, MD 100 mL/hr at 07/30/17 0916 500 mg at 07/30/17 0916  . OLANZapine (ZYPREXA) tablet 2.5 mg  2.5 mg Oral Q1400 Meredeth IdeLama, Gagan S, MD       And  . OLANZapine (ZYPREXA) tablet 5 mg  5 mg Oral QHS Meredeth IdeLama, Gagan S, MD   5 mg at 07/30/17 0020  . ondansetron (ZOFRAN) tablet 4 mg  4 mg Oral Q6H PRN Meredeth IdeLama, Gagan S, MD       Or  . ondansetron (ZOFRAN) injection 4 mg  4 mg Intravenous Q6H PRN Sharl MaLama, Sarina IllGagan S, MD      . polyvinyl alcohol (LIQUIFILM TEARS) 1.4 % ophthalmic solution 1 drop  1 drop Both Eyes QHS [place], Sarina IllGagan S, MD      . prazosin (MINIPRESS) capsule 1 mg  1 mg Oral QHS Meredeth IdeLama, Gagan S, MD   1 mg at 07/30/17 0046  . sertraline (ZOLOFT) tablet 50 mg  50 mg Oral Daily Meredeth IdeLama, Gagan S, MD   50 mg at 07/30/17 0915  . tamsulosin (FLOMAX) capsule 0.4 mg  0.4 mg Oral QHS Meredeth IdeLama, Gagan S, MD   0.4 mg at 07/30/17 0021  . thiamine (VITAMIN B-1) tablet 100 mg  100 mg Oral Daily Meredeth IdeLama, Gagan S, MD   100 mg at 07/30/17 0914  . traZODone (DESYREL) tablet 50 mg  50 mg Oral QHS Meredeth IdeLama, Gagan S, MD   50 mg at 07/30/17 0020  . [START ON 07/31/2017] venlafaxine XR (EFFEXOR-XR) 24 hr capsule 37.5 mg  37.5 mg Oral QODAY Meredeth IdeLama, Gagan S, MD         Discharge Medications: Please see discharge summary for a list of discharge medications.  Relevant Imaging Results:  Relevant Lab Results:   Additional Information    Joseantonio Dittmar, Juleen ChinaHeather D, LCSW

## 2017-07-30 NOTE — Progress Notes (Signed)
[facility]. Patient refuses MRSA swab ordered per protocol d/t patient from nursing facility. Education was given, and this RN attempted to get sample multiple times with no success. Morrison Oldisha, Charge RN aware.

## 2017-07-30 NOTE — Progress Notes (Signed)
[facility],report called and given [facility] Spencer LPN. Vital signs stable.Patient is to be transported by EMS to awaiting facility. IV catheter discontinued,catheter intact.

## 2017-08-13 ENCOUNTER — Encounter: Payer: Self-pay | Admitting: Neurology

## 2017-08-13 ENCOUNTER — Other Ambulatory Visit: Payer: Self-pay

## 2017-08-13 ENCOUNTER — Ambulatory Visit (INDEPENDENT_AMBULATORY_CARE_PROVIDER_SITE_OTHER): Payer: Medicare Other | Admitting: Neurology

## 2017-08-13 VITALS — BP 96/58 | HR 75 | Ht 68.0 in | Wt 150.0 lb

## 2017-08-13 DIAGNOSIS — F1096 Alcohol use, unspecified with alcohol-induced persisting amnestic disorder: Secondary | ICD-10-CM | POA: Diagnosis not present

## 2017-08-13 DIAGNOSIS — R413 Other amnesia: Secondary | ICD-10-CM
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
## 2017-08-13 NOTE — Patient Instructions (Addendum)
1. Continue all your medications 2. If tremors worsen, discuss Depakote with psychiatrist 3. Increase fluid intake to avoid low blood pressure 4. Discuss increased thirst with your PCP 5. Follow-up in 1 year, call for any changes

## 2017-08-22 ENCOUNTER — Encounter: Payer: Self-pay | Admitting: Neurology

## 2018-01-06 ENCOUNTER — Ambulatory Visit: Payer: Medicare Other | Admitting: Neurology

## 2018-01-06 ENCOUNTER — Encounter

## 2018-08-13 ENCOUNTER — Ambulatory Visit: Payer: Medicare Other | Admitting: Neurology

## 2018-08-13 ENCOUNTER — Encounter: Payer: Self-pay | Admitting: Neurology

## 2018-08-13 DIAGNOSIS — Z029 Encounter for administrative examinations, unspecified: Secondary | ICD-10-CM

## 2018-09-18 ENCOUNTER — Ambulatory Visit (INDEPENDENT_AMBULATORY_CARE_PROVIDER_SITE_OTHER): Payer: Medicare Other | Admitting: Neurology

## 2018-09-18 ENCOUNTER — Encounter: Payer: Self-pay | Admitting: Neurology

## 2018-09-18 ENCOUNTER — Other Ambulatory Visit: Payer: Self-pay

## 2018-09-18 VITALS — BP 146/72 | HR 74 | Ht 68.0 in | Wt 156.0 lb

## 2018-09-18 DIAGNOSIS — F1096 Alcohol use, unspecified with alcohol-induced persisting amnestic disorder: Secondary | ICD-10-CM

## 2018-09-18 DIAGNOSIS — F1027 Alcohol dependence with alcohol-induced persisting dementia: Secondary | ICD-10-CM

## 2018-09-18 NOTE — Patient Instructions (Signed)
Great seeing you! Continue all medications. Follow-up with PCP regarding labile glucose levels. Follow-up annually with cardiologist. Follow-up in 1 year in our office, call for any changes.

## 2018-09-18 NOTE — Progress Notes (Signed)
NEUROLOGY FOLLOW UP OFFICE NOTE  Joseph Bowen [id number]  DOB: 11/14/1945  HISTORY OF PRESENT ILLNESS: I had the pleasure of seeing Joseph Bowen in follow-up in the neurology clinic on 09/18/2018. The patient was last seen a year ago for memory loss and syncope.He is again accompanied by his legal guardian Joseph Bowen who helps supplement the history today. Since his last visit, Joseph Bowen reports that this is one of the most stable years he has had. Previously he had seen Cardiology for orthostatic hypotension and was started on Florinef. He has not been back to Cardiology and has not had any further syncopal episodes, but has had dizzy spells. Joseph Bowen feels some of they may be glucose related, one time he was drowsy and off, glucose level was 40. Last fasting glucose level was 246. He has not tried to abscond like before, he is on Depakote and Nuedexta. Joseph Bowen does note that he calls her in the evenings thinking he is still driving and wants to go to get a load for his truck, or that he smokes, when he has not smoked in 8 years. He reports memory is fair. Medications administered at SNF. He denies any headaches, diplopia, focal numbness/tingling/weakness, no falls. Tremor unchanged. He is independent with dressing and bathing, needs help with shaving.   HPI: This is a pleasant 73 yo LH man with a history of hypertension, diabetes, PTSD, who presented for memory loss and syncope. SNF staff reports that they first got involved with Joseph Bowen in December 2011 after he was admitted for hypoglycemia. 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 He went back to his trailer and apparently did not respond to visitors that police broke down his door and found him unresponsive with a blood sugar of 9. He was in a coma for 3 days, and Joseph Bowen reports that his memory issues became more noticeable. Prior to that, he would get lost driving (also had  DUI) and had missed utility bills. He has been at [facility] since then, where his health has overall improved. He is not drinking or smoking anymore. His memory has improved some, however Joseph Bowen reports that he "cannot keep his life in chronological order." He wakes up some days thinking he is 73 years old and would want to go for a drive, or thinks he is in [place] or [place]. 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 He continues to read voraciously and plays chess, but gets frustrated when he realizes he cannot do certain things. He himself reports that his memory is "bad," mainly with short-term memory. He is not aware of family history of memory issues. He is a [place] veteran, denies any head injuries. He has PTSD and night terrors, treated with Prazosin per Joseph Bowen. Joseph Bowen reports exposure to [employer].  Last November 2015, he was in the shower and fell. It was unclear if he lost consciousness, however on 06/12/14, he was sitting down playing chess when he suddenly slumped down. His blood sugar was 130. He was brought to his bed and woke up, then passed out again while lying on the bed. This happened a third time and his BP was noted to be low. Lisinopril was discontinued. He saw Cardiology and Prazosin dose was reduced. No further syncopal episodes since then. No worsening of night terrors.   Update 72/06/2017: He presents for an earlier visit  due to dizziness and report of "eyes rolling back" with recent fall on 07/18/16. He has also been referred to Cardiology for 1st degree heart block on recent EKG. Joseph Bowen reports 2 episodes of passing out on 07/19/15 and 07/21/15. He has poor memory, but does recall that both of them occurred when he went from sitting to standing position. Joseph Bowen reports that he remembers having a bad memory day, looking for his truck, trying to leave [facility], when he could recall that he felt lightheaded and felt his eyes  roll back. They checked his glucose and BP both times, BP after the second episode was 120/62, glucose levels normal (she does not have the values). He had previously been having syncopal episodes that stopped after Minipress dose was reduced. Joseph Bowen is very hesitant to further reduce this, as this is being used off-label for his night terrors. They have not discussed this with his psychiatrist because she states there is a big turnover of physicians, and they want to make big changes each time instead of adjusting his current medication or adding on. He would be brought to the hospital in the past when psychiatric medications were suddenly changed. Joseph Bowen wonders if he has Seasonal Affective Disorder, he always seems to worsen between November to March, with more behaviors and hospital visits. He states he is not depressed. He states he used to be depressed and does not feel that way currently, stating he feels normal. He has been having left shoulder and arm pain constantly for the past 2 weeks. He has some tingling in his left hand. He denies any neck pain, no bowel/bladder dysfunction. Right side is unaffected. He had an MRI C-spine yesterday which showed disc bulging at C5-6 effacing the ventral thecal sac, with moderately severe to severe foraminal narrowing, worse on right; moderately severe to severe bilateral foraminal narrowing C6-7; severe bilateral foraminal narrowing C7-T1.   Diagnostic data: I personally reviewed MRI brain without contrast which showed moderate diffuse volume loss with chronic ventriculomegaly, stable and fairly diminutive appearance of the temporal horns, no transependymal edema suspected. There was moderate chronic microvascular disease.  PAST MEDICAL HISTORY: Past Medical History:  Diagnosis Date  . Alcohol-induced persisting amnestic disorder (HCC)   . Anemia   . Anxiety   . Atherosclerosis of arteries   . Benign prostatic hypertrophy   . Chronic pancreatitis (HCC)     . Constipation   . COPD (chronic obstructive pulmonary disease) (HCC)   . Dementia (HCC)   . Dementia (HCC)    due to encephalopathy from diabetic coma 2011.  . Depression   . Difficulty walking   . Dizziness and giddiness   . 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  . Essential hypertension   . Euthyroid sick syndrome   . Glaucoma   . H/O ETOH abuse   . History of falling   . Hydrocele   . Hypoglycemia   . Hypothyroidism   . Insomnia   . Legally blind   . Orthostatic hypotension   . Pain in left shoulder   . Personal history of alcoholism (HCC)   . Post traumatic stress disorder   . Psychosis (HCC)   . Radiculopathy, cervical region   . Retinopathy   . Sleep terrors (night terrors)   . Syncope and collapse   . Type 2 diabetes mellitus (HCC)   . Urinary incontinence   . Vitamin B deficiency   . Vitamin D deficiency     MEDICATIONS: Current  Outpatient Medications on File Prior to Visit  Medication Sig Dispense Refill  . atorvastatin (LIPITOR) 40 MG tablet Take 40 mg by mouth daily.    . bisacodyl (DULCOLAX) 10 MG suppository Place 10 mg rectally as needed for moderate constipation.    . cyanocobalamin 1000 MCG tablet Take 1,000 mcg by mouth daily.    Marland Kitchen Dextromethorphan-Quinidine (NUEDEXTA) 20-10 MG CAPS Take 1 capsule by mouth 2 (two) times daily.     . divalproex (DEPAKOTE SPRINKLE) 125 MG capsule Take 125-250 mg by mouth 3 (three) times daily. Take  twice daily and take  daily at noon    . docusate sodium (COLACE) 100 MG capsule Take 100 mg by mouth daily as needed for mild constipation.    . ergocalciferol (VITAMIN D2) 50000 units capsule Take 50,000 Units by mouth every 30 (thirty) days.    . fludrocortisone (FLORINEF) 0.1 MG tablet Take 1 tablet (0.1 mg total) by mouth daily. 30 tablet 6  . gabapentin (NEURONTIN) 400 MG capsule Take 400 mg by mouth 3 (three) times daily.    Marland Kitchen guaifenesin (ROBITUSSIN) 100 MG/5ML syrup Take 200 mg by mouth every 4  (four) hours as needed for cough.    Marland Kitchen ibuprofen (ADVIL,MOTRIN) 200 MG tablet Take 400 mg by mouth every 6 (six) hours as needed.    . insulin glargine (LANTUS) 100 UNIT/ML injection Inject 0.09 mLs (9 Units total) into the skin daily. 10 mL 11  . levothyroxine (SYNTHROID, LEVOTHROID) 125 MCG tablet Take 62.5 mcg by mouth daily before breakfast.    . lipase/protease/amylase (CREON) 36000 UNITS CPEP capsule Take 1 capsule (36,000 Units total) by mouth 3 (three) times daily with meals. 270 capsule 0  . LORazepam (ATIVAN) 0.5 MG tablet Take one tablet by mouth three times daily; Take one tablet by mouth twice daily as needed for breakthrough anxiety (Patient taking differently: Take 0.5 mg by mouth daily as needed for anxiety. ) 150 tablet 5  . Melatonin 1 MG TABS Take 6 mg by mouth at bedtime.     . memantine (NAMENDA) 5 MG tablet Take 5 mg by mouth 2 (two) times daily.    . metFORMIN (GLUCOPHAGE) 1000 MG tablet Take 1,000 mg by mouth 2 (two) times daily with a meal.    . OLANZapine (ZYPREXA) 2.5 MG tablet Take 2.5-5 mg by mouth 2 (two) times daily. 2.5mg  in the afternoon and  at bedtime    . prazosin (MINIPRESS) 2 MG capsule Take 1 mg by mouth at bedtime.     Marland Kitchen Propylene Glycol (SYSTANE BALANCE) 0.6 % SOLN Place 1 drop into both eyes at bedtime.    . sennosides-docusate sodium (SENOKOT-S) 8.6-50 MG tablet Take 1 tablet by mouth 2 (two) times daily.    . sertraline (ZOLOFT) 50 MG tablet Take 50 mg by mouth daily.     . sitaGLIPtin (JANUVIA) 50 MG tablet Take 50 mg by mouth daily.    . tamsulosin (FLOMAX) 0.4 MG CAPS capsule Take 0.4 mg by mouth at bedtime.     . thiamine 100 MG tablet Take 100 mg by mouth daily.    . traZODone (DESYREL) 50 MG tablet Take 50 mg by mouth at bedtime.    Marland Kitchen venlafaxine XR (EFFEXOR-XR) 37.5 MG 24 hr capsule Take 37.5 mg by mouth every other day.     No current facility-administered medications on file prior to visit.     ALLERGIES: No Known Allergies  FAMILY  HISTORY: Family History  Problem Relation Age of  Onset  . Heart disease Unknown   . Diabetes Unknown     SOCIAL HISTORY: Social History   Socioeconomic History  . Marital status: Divorced    Spouse name: Not on file  . Number of children: Not on file  . Years of education: Not on file  . Highest education level: Not on file  Occupational History  . Not on file  Social Needs  . Financial resource strain: Not on file  . Food insecurity:    Worry: Not on file    Inability: Not on file  . Transportation needs:    Medical: Not on file    Non-medical: Not on file  Tobacco Use  . Smoking status: Current Some Day Smoker    Packs/day: 1.50    Years: 48.00    Pack years: 72.00    Types: Cigarettes    Start date: 09/17/1961    Last attempt to quit: 09/17/2009    Years since quitting: 9.0  . Smokeless tobacco: Never Used  Substance and Sexual Activity  . Alcohol use: No    Alcohol/week: 0.0 standard drinks    Comment: hx of alcoholism-last used 2011  . Drug use: No  . Sexual activity: Never    Birth control/protection: None  Lifestyle  . Physical activity:    Days per week: Not on file    Minutes per session: Not on file  . Stress: Not on file  Relationships  . Social connections:    Talks on phone: Not on file    Gets together: Not on file    Attends religious service: Not on file    Active member of club or organization: Not on file    Attends meetings of clubs or organizations: Not on file    Relationship status: Not on file  . Intimate partner violence:    Fear of current or ex partner: Not on file    Emotionally abused: Not on file    Physically abused: Not on file    Forced sexual activity: Not on file  Other Topics Concern  . Not on file  Social History Narrative  . Not on file    REVIEW OF SYSTEMS: Constitutional: No fevers, chills, or sweats, no generalized fatigue, change in appetite Eyes: No visual changes, double vision, eye pain Ear, nose and throat:  No hearing loss, ear pain, nasal congestion, sore throat Cardiovascular: No chest pain, palpitations Respiratory:  No shortness of breath at rest or with exertion, wheezes GastrointestinaI: No nausea, vomiting, diarrhea, abdominal pain, fecal incontinence Genitourinary:  No dysuria, urinary retention or frequency Musculoskeletal:  No neck pain, back pain Integumentary: No rash, pruritus, skin lesions Neurological: as above Psychiatric: No depression, insomnia, anxiety Endocrine: No palpitations, fatigue, diaphoresis, mood swings, change in appetite, change in weight, increased thirst Hematologic/Lymphatic:  No anemia, purpura, petechiae. Allergic/Immunologic: no itchy/runny eyes, nasal congestion, recent allergic reactions, rashes  PHYSICAL EXAM: Vitals:   09/18/18 0838  BP: (!) 146/72  Pulse: 74  SpO2: 96%   General: No acute distress Head:  Normocephalic/atraumatic Neck: supple, no paraspinal tenderness, full range of motion Heart:  Regular rate and rhythm Lungs:  Clear to auscultation bilaterally Back: No paraspinal tenderness Skin/Extremities: No rash, no edema Neurological Exam: alert and oriented to person, city/state, time. No aphasia or dysarthria. Fund of knowledge is reduced.  Recent and remote memory are impaired. 1/3 delayed recall.  Attention and concentration are normal.    Able to name objects and repeat phrases.  MMSE -  Mini Mental State Exam 09/18/2018 08/13/2017 04/21/2015  Orientation to time 4 2 5   Orientation to Place 3 5 5   Registration 3 3 3   Attention/ Calculation 5 3 5   Recall 1 1 2   Language- name 2 objects 2 2 2   Language- repeat 1 1 1   Language- follow 3 step command 3 3 3   Language- read & follow direction 1 1 1   Write a sentence 1 1 1   Copy design 1 0 1  Total score 25 22 29    Cranial nerves: Pupils equal, round, reactive to light.  Extraocular movements intact with no nystagmus. Visual fields full. Facial sensation intact. No facial asymmetry.  Tongue, uvula, palate midline.  Motor: Bulk and tone normal, muscle strength 5/5 throughout with no pronator drift.  Sensation to light touch intact.  No extinction to double simultaneous stimulation.  Gait narrow-based and steady, able to tandem walk adequately.  Romberg negative. 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 MRI brain without contrast showed volume loss with chronic ventriculomegaly. Behavioral changes have been stable the past year, continue Depakote and Nuedexta. His MMSE today is 25/30 (22/30 in 2019, 29/30 in 2016), continue Memantine 5mg  BID. Continue supportive care and 24/7 supervision. Follow-up with PCP on labile glucose levels, Cardiology for orthostatic hypotension/dizziness. He will follow-up in 1 year and knows to call our office for any changes.   Thank you for allowing me to participate in his care.  Please do not hesitate to call for any questions or concerns.  The duration of this appointment visit was 30 minutes of face-to-face time with the patient.  Greater than 50% of this time was spent in counseling, explanation of diagnosis, planning of further management, and coordination of care.   Patrcia Dolly, M.D.   CC: Dr. Eden Emms

## 2018-12-26 IMAGING — CR DG ABDOMEN 1V
1 series · 1 of 1 positions shown · non-contrast
Comparison: 11/08/2016

CLINICAL DATA: Pre lithotripsy.

EXAM:
ABDOMEN - 1 VIEW

[t abdomen supine]
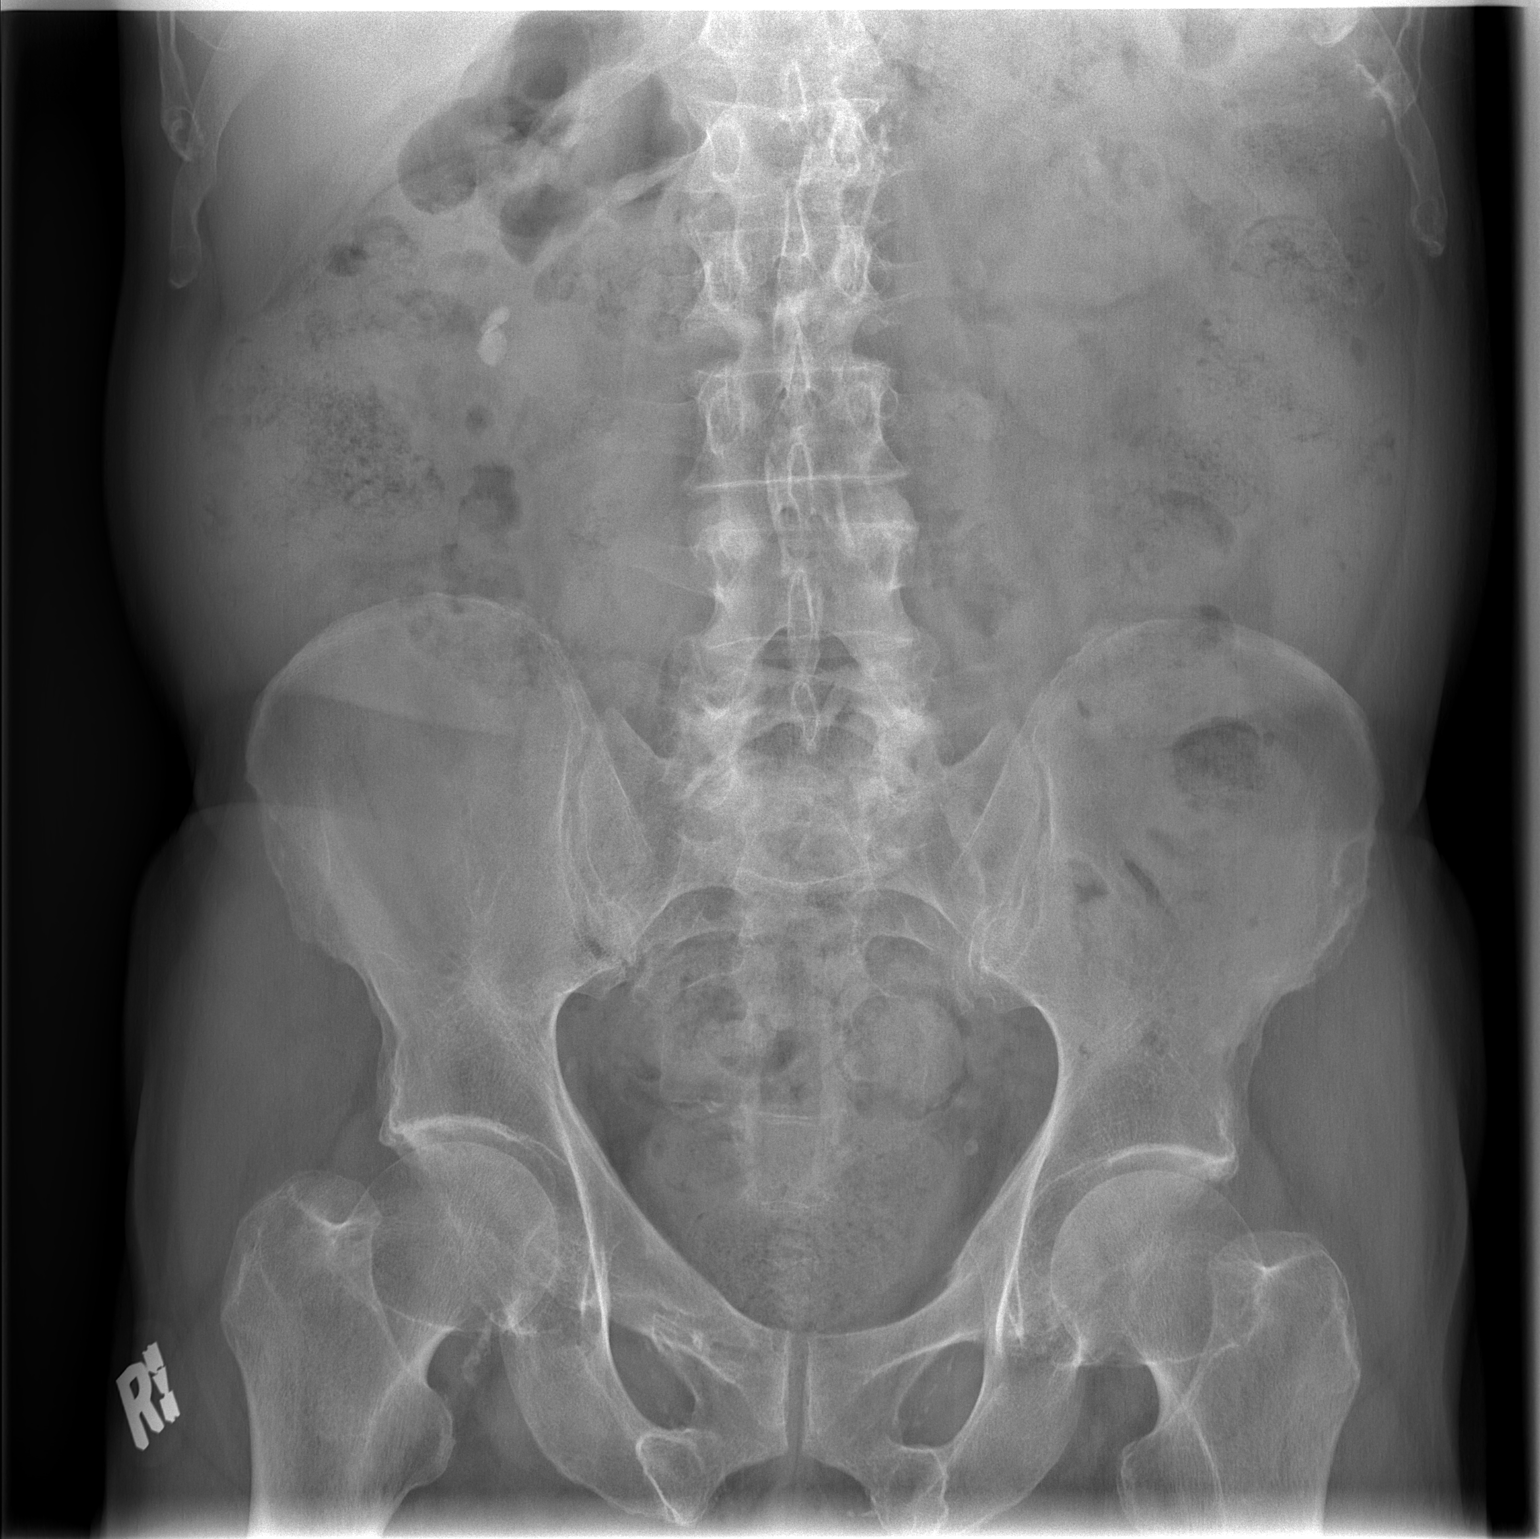

[1 of 1 positions shown; findings below may reference images not displayed]

FINDINGS: Right renal calculus measuring 1.6 cm is again identified in the
expected location of the inferior pole of right kidney. No
additional urinary tract calculi identified. Moderate stool burden
noted throughout the colon.
IMPRESSION: 1. 1.6 cm right renal calculus again noted.
2. Moderate stool burden within the colon.

## 2019-09-08 ENCOUNTER — Encounter: Payer: Self-pay | Admitting: Neurology

## 2019-09-20 ENCOUNTER — Other Ambulatory Visit: Payer: Self-pay

## 2019-09-20 ENCOUNTER — Encounter: Payer: Self-pay | Admitting: Neurology

## 2019-09-20 ENCOUNTER — Telehealth (INDEPENDENT_AMBULATORY_CARE_PROVIDER_SITE_OTHER): Payer: Medicare Other | Admitting: Neurology

## 2019-09-20 VITALS — BP 126/89 | HR 64 | Temp 97.4°F | Resp 18 | Ht 68.0 in

## 2019-09-20 DIAGNOSIS — F1027 Alcohol dependence with alcohol-induced persisting dementia: Secondary | ICD-10-CM | POA: Diagnosis not present

## 2019-09-20 NOTE — Progress Notes (Signed)
Virtual Visit via Video Note The purpose of this virtual visit is to provide medical care while limiting exposure to the novel coronavirus.    Consent was obtained for video visit:  Yes.   Answered questions that patient had about telehealth interaction:  Yes.   I discussed the limitations, risks, security and privacy concerns of performing an evaluation and management service by telemedicine. I also discussed with the patient that there may be a patient responsible charge related to this service. The patient expressed understanding and agreed to proceed.  Pt location: Home Physician Location: office Name of referring provider:  Colon Branch, MD I connected with Gray Bernhardt at patients initiation/request on 09/20/2019 at 10:00 AM EST by video enabled telemedicine application and verified that I am speaking with the correct person using two identifiers. Pt MRN:  [id number] Pt DOB:  1946-01-27 Video Participants:  Candice Camp (staff at [facility])   History of Present Illness:  The patient was seen as a virtual video visit on 09/20/2019. He was last seen a year ago for memory loss and syncope. Leah, SNF staff, is present to provide additional information. Tacey Ruiz reports that he has been doing good. No recent hospitalizations, no syncopal episodes. Otniel states his memory is "not good, I forget everything." Staff administers medications. Tacey Ruiz reports he is good with following instructions. He has not tried to abscond recently, mood stable on Depakote, olanzapine, and Nuedexta. He is also on Memantine 5mg  BID. He denies any headaches, dizziness, no falls. Sleep has been good, no wandering behavior. Appetite is good. He is independent with dressing and bathing.   HPI: This is a pleasant 74 yo LH man with a history of hypertension, diabetes, PTSD, who presented for memory loss and syncope. SNF staff reports that they first got involved with Mr. Gacek in December 2011 after he was  admitted for hypoglycemia. 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 He went back to his trailer and apparently did not respond to visitors that police broke down his door and found him unresponsive with a blood sugar of 9. He was in a coma for 3 days, and [name] reports that his memory issues became more noticeable. Prior to that, he would get lost driving (also had DUI) and had missed utility bills. He has been at [facility] since then, where his health has overall improved. He is not drinking or smoking anymore. His memory has improved some, however [name] reports that he "cannot keep his life in chronological order." He wakes up some days thinking he is 74 years old and would want to go for a drive, or thinks he is in 42 or [place]. 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 He continues to read voraciously and plays chess, but gets frustrated when he realizes he cannot do certain things. He himself reports that his memory is "bad," mainly with short-term memory. He is not aware of family history of memory issues. He is a [employer] veteran, denies any head injuries. He has PTSD and night terrors, treated with Prazosin per [name]. [name] reports exposure to [place].  Last November 2015, he was in the shower and fell. It was unclear if he lost consciousness, however on 06/12/14, he was sitting down playing chess when he suddenly slumped down. His blood sugar was 130. He was brought to  his bed and woke up, then passed out again while lying on the bed. This happened a third time and his BP was noted to be low. Lisinopril was discontinued. He saw Cardiology and Prazosin dose was reduced. No further syncopal episodes since then. No worsening of night terrors.   Update 07/26/2016: He presents for an earlier visit due to dizziness and report of "eyes rolling back" with recent fall on  07/18/16. He has also been referred to Cardiology for 1st degree heart block on recent EKG. [name] reports 2 episodes of passing out on 07/19/15 and 07/21/15. He has poor memory, but does recall that both of them occurred when he went from sitting to standing position. [name] reports that he remembers having a bad memory day, looking for his truck, trying to leave [facility], when he could recall that he felt lightheaded and felt his eyes roll back. They checked his glucose and BP both times, BP after the second episode was 120/62, glucose levels normal (she does not have the values). He had previously been having syncopal episodes that stopped after Minipress dose was reduced. [name] is very hesitant to further reduce this, as this is being used off-label for his night terrors. They have not discussed this with his psychiatrist because she states there is a big turnover of physicians, and they want to make big changes each time instead of adjusting his current medication or adding on. He would be brought to the hospital in the past when psychiatric medications were suddenly changed. [name] wonders if he has Seasonal Affective Disorder, he always seems to worsen between November to March, with more behaviors and hospital visits. He states he is not depressed. He states he used to be depressed and does not feel that way currently, stating he feels normal. He has been having left shoulder and arm pain constantly for the past 2 weeks. He has some tingling in his left hand. He denies any neck pain, no bowel/bladder dysfunction. Right side is unaffected. He had an MRI C-spine yesterday which showed disc bulging at C5-6 effacing the ventral thecal sac, with moderately severe to severe foraminal narrowing, worse on right; moderately severe to severe bilateral foraminal narrowing C6-7; severe bilateral foraminal narrowing C7-T1.   Diagnostic data: I personally reviewed MRI brain without contrast which showed moderate  diffuse volume loss with chronic ventriculomegaly, stable and fairly diminutive appearance of the temporal horns, no transependymal edema suspected. There was moderate chronic microvascular disease.   Current Outpatient Medications on File Prior to Visit  Medication Sig Dispense Refill  . atorvastatin (LIPITOR) 40 MG tablet Take 40 mg by mouth daily.    . fludrocortisone (FLORINEF) 0.1 MG tablet Take 1 tablet (0.1 mg total) by mouth daily. 30 tablet 6  . gabapentin (NEURONTIN) 400 MG capsule Take 400 mg by mouth 3 (three) times daily.    . insulin glargine (LANTUS) 100 UNIT/ML injection Inject 0.09 mLs (9 Units total) into the skin daily. (Patient taking differently: Inject 22 Units into the skin daily. ) 10 mL 11  . Levothyroxine Sodium 137 MCG CAPS Take 137 mcg by mouth daily before breakfast.     . lipase/protease/amylase (CREON) 36000 UNITS CPEP capsule Take 1 capsule (36,000 Units total) by mouth 3 (three) times daily with meals. 270 capsule 0  . Melatonin 1 MG TABS Take 6 mg by mouth at bedtime.     Marland Kitchen MELATONIN MAXIMUM STRENGTH 5 MG TABS Take 1 tablet by mouth at bedtime.    Marland Kitchen  memantine (NAMENDA) 5 MG tablet Take 5 mg by mouth 2 (two) times daily.    . metFORMIN (GLUCOPHAGE) 1000 MG tablet Take 1,000 mg by mouth 2 (two) times daily with a meal.    . OLANZapine (ZYPREXA) 2.5 MG tablet Take 2.5-5 mg by mouth 2 (two) times daily. 2.5mg  in the afternoon and 5mg  at bedtime    . OLANZapine (ZYPREXA) 5 MG tablet Take 5 mg by mouth at bedtime.    . sennosides-docusate sodium (SENOKOT-S) 8.6-50 MG tablet Take 1 tablet by mouth 2 (two) times daily.    . sertraline (ZOLOFT) 50 MG tablet Take 50 mg by mouth daily.     . sitaGLIPtin (JANUVIA) 100 MG tablet Take 100 mg by mouth daily.     10-21-1977 thiamine 100 MG tablet Take 100 mg by mouth daily.    . traZODone (DESYREL) 50 MG tablet Take 50 mg by mouth at bedtime.    . bisacodyl (DULCOLAX) 10 MG suppository Place 10 mg rectally as needed for moderate  constipation.    . cyanocobalamin 1000 MCG tablet Take 1,000 mcg by mouth daily.    Marland Kitchen Dextromethorphan-Quinidine (NUEDEXTA) 20-10 MG CAPS Take 1 capsule by mouth 2 (two) times daily.     . divalproex (DEPAKOTE SPRINKLE) 125 MG capsule Take 125-250 mg by mouth 3 (three) times daily. Take 250mg  twice daily and take 125mg  daily at noon    . docusate sodium (COLACE) 100 MG capsule Take 100 mg by mouth daily as needed for mild constipation.    . ergocalciferol (VITAMIN D2) 50000 units capsule Take 50,000 Units by mouth every 30 (thirty) days.    Marland Kitchen guaifenesin (ROBITUSSIN) 100 MG/5ML syrup Take 200 mg by mouth every 4 (four) hours as needed for cough.    ibuprofen (ADVIL,MOTRIN) 200 MG tablet Take 400 mg by mouth every 6 (six) hours as needed.    LORazepam (ATIVAN) 0.5 MG tablet Take one tablet by mouth three times daily; Take one tablet by mouth twice daily as needed for breakthrough anxiety (Patient taking differently: Take 0.5 mg by mouth daily as needed for anxiety. ) 150 tablet 5  . prazosin (MINIPRESS) 2 MG capsule Take 1 mg by mouth at bedtime.     Marland Kitchen Propylene Glycol (SYSTANE BALANCE) 0.6 % SOLN Place 1 drop into both eyes at bedtime.    . tamsulosin (FLOMAX) 0.4 MG CAPS capsule Take 0.4 mg by mouth at bedtime.     Marland Kitchen venlafaxine XR (EFFEXOR-XR) 37.5 MG 24 hr capsule Take 37.5 mg by mouth every other day.     No current facility-administered medications on file prior to visit.     Observations/Objective:   Vitals:   09/20/19 0921  BP: 126/89  Pulse: 64  Resp: 18  Temp: (!) 97.4 F (36.3 C)  SpO2: 96%  Height: 5\' 8"  (1.727 m)   GEN:  The patient appears stated age and is in NAD.  Neurological examination: Patient is awake, alert, oriented x 2. No aphasia or dysarthria. Intact fluency and comprehension. Remote and recent memory impaired. Cranial nerves: Extraocular movements intact with no nystagmus. No facial asymmetry. Motor: moves all extremities symmetrically, at least  anti-gravity x 4. No incoordination on finger to nose testing. Gait: narrow-based and steady. No clear tremor on video today.  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 MRI brain without contrast showed volume loss with chronic ventriculomegaly. He was previously having behavioral changes that appear to have been overall stable in the past 2 years, continue follow-up with Psychiatry. Staff reports he has been doing well. No syncopal episodes in the past year. Continue Memantine 5mg  BID. He has 24/7 supervision and does not drive. He will follow-up in 1 year and knows to call our office for any changes.     Follow Up Instructions:   -I discussed the assessment and treatment plan with the patient/staff. The patient/staff were provided an opportunity to ask questions and all were answered. The patient/staff agreed with the plan and demonstrated an understanding of the instructions.   The patient/staff were advised to call back or seek an in-person evaluation if the symptoms worsen or if the condition fails to improve as anticipated.     Cameron Sprang, MD

## 2020-02-01 ENCOUNTER — Other Ambulatory Visit: Payer: Self-pay

## 2020-02-01 ENCOUNTER — Encounter (HOSPITAL_COMMUNITY): Payer: Self-pay | Admitting: Emergency Medicine

## 2020-02-01 ENCOUNTER — Inpatient Hospital Stay (HOSPITAL_COMMUNITY)
Admission: EM | Admit: 2020-02-01 | Discharge: 2020-02-04 | DRG: 683 | Disposition: A | Discharge status: Skilled Nursing Facility | Payer: Medicare Other | Source: Skilled Nursing Facility | Attending: Family Medicine | Admitting: Family Medicine

## 2020-02-01 DIAGNOSIS — K8020 Calculus of gallbladder without cholecystitis without obstruction: Secondary | ICD-10-CM

## 2020-02-01 DIAGNOSIS — F17210 Nicotine dependence, cigarettes, uncomplicated: Secondary | ICD-10-CM | POA: Diagnosis present

## 2020-02-01 DIAGNOSIS — E1151 Type 2 diabetes mellitus with diabetic peripheral angiopathy without gangrene: Secondary | ICD-10-CM | POA: Diagnosis present

## 2020-02-01 DIAGNOSIS — F03911 Unspecified dementia, unspecified severity, with agitation: Secondary | ICD-10-CM | POA: Diagnosis present

## 2020-02-01 DIAGNOSIS — H548 Legal blindness, as defined in USA: Secondary | ICD-10-CM | POA: Diagnosis present

## 2020-02-01 DIAGNOSIS — F4310 Post-traumatic stress disorder, unspecified: Secondary | ICD-10-CM | POA: Diagnosis present

## 2020-02-01 DIAGNOSIS — N390 Urinary tract infection, site not specified: Secondary | ICD-10-CM | POA: Diagnosis present

## 2020-02-01 DIAGNOSIS — E0781 Sick-euthyroid syndrome: Secondary | ICD-10-CM | POA: Diagnosis present

## 2020-02-01 DIAGNOSIS — N200 Calculus of kidney: Secondary | ICD-10-CM

## 2020-02-01 DIAGNOSIS — Z85820 Personal history of malignant melanoma of skin: Secondary | ICD-10-CM

## 2020-02-01 DIAGNOSIS — N179 Acute kidney failure, unspecified: Principal | ICD-10-CM | POA: Diagnosis present

## 2020-02-01 DIAGNOSIS — A419 Sepsis, unspecified organism: Secondary | ICD-10-CM

## 2020-02-01 DIAGNOSIS — F1096 Alcohol use, unspecified with alcohol-induced persisting amnestic disorder: Secondary | ICD-10-CM | POA: Diagnosis present

## 2020-02-01 DIAGNOSIS — E11319 Type 2 diabetes mellitus with unspecified diabetic retinopathy without macular edema: Secondary | ICD-10-CM | POA: Diagnosis present

## 2020-02-01 DIAGNOSIS — Z20822 Contact with and (suspected) exposure to covid-19: Secondary | ICD-10-CM | POA: Diagnosis present

## 2020-02-01 DIAGNOSIS — Z781 Physical restraint status: Secondary | ICD-10-CM

## 2020-02-01 DIAGNOSIS — H409 Unspecified glaucoma: Secondary | ICD-10-CM | POA: Diagnosis present

## 2020-02-01 DIAGNOSIS — F419 Anxiety disorder, unspecified: Secondary | ICD-10-CM | POA: Diagnosis present

## 2020-02-01 DIAGNOSIS — F03918 Unspecified dementia, unspecified severity, with other behavioral disturbance: Secondary | ICD-10-CM | POA: Diagnosis present

## 2020-02-01 DIAGNOSIS — I10 Essential (primary) hypertension: Secondary | ICD-10-CM | POA: Diagnosis present

## 2020-02-01 DIAGNOSIS — N400 Enlarged prostate without lower urinary tract symptoms: Secondary | ICD-10-CM | POA: Diagnosis present

## 2020-02-01 DIAGNOSIS — Z79899 Other long term (current) drug therapy: Secondary | ICD-10-CM

## 2020-02-01 DIAGNOSIS — T68XXXA Hypothermia, initial encounter: Secondary | ICD-10-CM | POA: Clinically undetermined

## 2020-02-01 DIAGNOSIS — J449 Chronic obstructive pulmonary disease, unspecified: Secondary | ICD-10-CM | POA: Diagnosis present

## 2020-02-01 DIAGNOSIS — E872 Acidosis, unspecified: Secondary | ICD-10-CM

## 2020-02-01 DIAGNOSIS — F1026 Alcohol dependence with alcohol-induced persisting amnestic disorder: Secondary | ICD-10-CM | POA: Diagnosis present

## 2020-02-01 DIAGNOSIS — K861 Other chronic pancreatitis: Secondary | ICD-10-CM | POA: Diagnosis present

## 2020-02-01 DIAGNOSIS — Z794 Long term (current) use of insulin: Secondary | ICD-10-CM

## 2020-02-01 DIAGNOSIS — R4182 Altered mental status, unspecified: Secondary | ICD-10-CM | POA: Diagnosis not present

## 2020-02-01 DIAGNOSIS — K5901 Slow transit constipation: Secondary | ICD-10-CM | POA: Diagnosis present

## 2020-02-01 DIAGNOSIS — E860 Dehydration: Secondary | ICD-10-CM | POA: Diagnosis present

## 2020-02-01 DIAGNOSIS — S0990XA Unspecified injury of head, initial encounter: Secondary | ICD-10-CM | POA: Diagnosis present

## 2020-02-01 DIAGNOSIS — F1021 Alcohol dependence, in remission: Secondary | ICD-10-CM

## 2020-02-01 DIAGNOSIS — R413 Other amnesia: Secondary | ICD-10-CM | POA: Diagnosis present

## 2020-02-01 DIAGNOSIS — F0391 Unspecified dementia with behavioral disturbance: Secondary | ICD-10-CM | POA: Diagnosis present

## 2020-02-01 DIAGNOSIS — I959 Hypotension, unspecified: Secondary | ICD-10-CM | POA: Diagnosis present

## 2020-02-01 DIAGNOSIS — E039 Hypothyroidism, unspecified: Secondary | ICD-10-CM | POA: Diagnosis present

## 2020-02-01 DIAGNOSIS — M5412 Radiculopathy, cervical region: Secondary | ICD-10-CM | POA: Diagnosis present

## 2020-02-01 DIAGNOSIS — E1165 Type 2 diabetes mellitus with hyperglycemia: Secondary | ICD-10-CM | POA: Diagnosis present

## 2020-02-01 DIAGNOSIS — E538 Deficiency of other specified B group vitamins: Secondary | ICD-10-CM | POA: Diagnosis present

## 2020-02-01 NOTE — ED Triage Notes (Signed)
Patient brought in by EMS from [facility] for altered mental status and being hypotensive. EMS report BP 83/57 after 500 of NS fluid.

## 2020-02-02 ENCOUNTER — Other Ambulatory Visit: Payer: Self-pay

## 2020-02-02 ENCOUNTER — Encounter (HOSPITAL_COMMUNITY): Payer: Self-pay | Admitting: Family Medicine

## 2020-02-02 ENCOUNTER — Inpatient Hospital Stay (HOSPITAL_COMMUNITY): Payer: Medicare Other

## 2020-02-02 ENCOUNTER — Emergency Department (HOSPITAL_COMMUNITY): Payer: Medicare Other

## 2020-02-02 DIAGNOSIS — I959 Hypotension, unspecified: Secondary | ICD-10-CM | POA: Diagnosis present

## 2020-02-02 DIAGNOSIS — F0391 Unspecified dementia with behavioral disturbance: Secondary | ICD-10-CM | POA: Diagnosis present

## 2020-02-02 DIAGNOSIS — Z794 Long term (current) use of insulin: Secondary | ICD-10-CM

## 2020-02-02 DIAGNOSIS — N390 Urinary tract infection, site not specified: Secondary | ICD-10-CM | POA: Diagnosis present

## 2020-02-02 DIAGNOSIS — E872 Acidosis, unspecified: Secondary | ICD-10-CM | POA: Diagnosis present

## 2020-02-02 DIAGNOSIS — F419 Anxiety disorder, unspecified: Secondary | ICD-10-CM | POA: Diagnosis present

## 2020-02-02 DIAGNOSIS — F17210 Nicotine dependence, cigarettes, uncomplicated: Secondary | ICD-10-CM | POA: Diagnosis present

## 2020-02-02 DIAGNOSIS — I10 Essential (primary) hypertension: Secondary | ICD-10-CM | POA: Diagnosis present

## 2020-02-02 DIAGNOSIS — M5412 Radiculopathy, cervical region: Secondary | ICD-10-CM | POA: Diagnosis present

## 2020-02-02 DIAGNOSIS — F03911 Unspecified dementia, unspecified severity, with agitation: Secondary | ICD-10-CM | POA: Diagnosis present

## 2020-02-02 DIAGNOSIS — K5901 Slow transit constipation: Secondary | ICD-10-CM

## 2020-02-02 DIAGNOSIS — H548 Legal blindness, as defined in USA: Secondary | ICD-10-CM | POA: Diagnosis present

## 2020-02-02 DIAGNOSIS — Z79899 Other long term (current) drug therapy: Secondary | ICD-10-CM | POA: Diagnosis not present

## 2020-02-02 DIAGNOSIS — N179 Acute kidney failure, unspecified: Secondary | ICD-10-CM | POA: Diagnosis present

## 2020-02-02 DIAGNOSIS — A419 Sepsis, unspecified organism: Secondary | ICD-10-CM | POA: Diagnosis not present

## 2020-02-02 DIAGNOSIS — N400 Enlarged prostate without lower urinary tract symptoms: Secondary | ICD-10-CM

## 2020-02-02 DIAGNOSIS — E860 Dehydration: Secondary | ICD-10-CM | POA: Diagnosis present

## 2020-02-02 DIAGNOSIS — R4182 Altered mental status, unspecified: Secondary | ICD-10-CM | POA: Diagnosis present

## 2020-02-02 DIAGNOSIS — F4310 Post-traumatic stress disorder, unspecified: Secondary | ICD-10-CM | POA: Diagnosis present

## 2020-02-02 DIAGNOSIS — H409 Unspecified glaucoma: Secondary | ICD-10-CM | POA: Diagnosis present

## 2020-02-02 DIAGNOSIS — E1165 Type 2 diabetes mellitus with hyperglycemia: Secondary | ICD-10-CM

## 2020-02-02 DIAGNOSIS — F1021 Alcohol dependence, in remission: Secondary | ICD-10-CM

## 2020-02-02 DIAGNOSIS — K861 Other chronic pancreatitis: Secondary | ICD-10-CM | POA: Diagnosis present

## 2020-02-02 DIAGNOSIS — F1096 Alcohol use, unspecified with alcohol-induced persisting amnestic disorder: Secondary | ICD-10-CM

## 2020-02-02 DIAGNOSIS — E0781 Sick-euthyroid syndrome: Secondary | ICD-10-CM | POA: Diagnosis present

## 2020-02-02 DIAGNOSIS — J449 Chronic obstructive pulmonary disease, unspecified: Secondary | ICD-10-CM | POA: Diagnosis present

## 2020-02-02 DIAGNOSIS — K860 Alcohol-induced chronic pancreatitis: Secondary | ICD-10-CM | POA: Diagnosis not present

## 2020-02-02 DIAGNOSIS — Z20822 Contact with and (suspected) exposure to covid-19: Secondary | ICD-10-CM | POA: Diagnosis present

## 2020-02-02 DIAGNOSIS — Z85820 Personal history of malignant melanoma of skin: Secondary | ICD-10-CM | POA: Diagnosis not present

## 2020-02-02 DIAGNOSIS — E039 Hypothyroidism, unspecified: Secondary | ICD-10-CM | POA: Diagnosis present

## 2020-02-02 DIAGNOSIS — E11319 Type 2 diabetes mellitus with unspecified diabetic retinopathy without macular edema: Secondary | ICD-10-CM | POA: Diagnosis present

## 2020-02-02 DIAGNOSIS — E538 Deficiency of other specified B group vitamins: Secondary | ICD-10-CM

## 2020-02-02 DIAGNOSIS — R6520 Severe sepsis without septic shock: Secondary | ICD-10-CM | POA: Diagnosis not present

## 2020-02-02 LAB — URINALYSIS, ROUTINE W REFLEX MICROSCOPIC
Bacteria, UA: NONE SEEN
Bilirubin Urine: NEGATIVE
Glucose, UA: 50 mg/dL — AB
Ketones, ur: 5 mg/dL — AB
Leukocytes,Ua: NEGATIVE
Nitrite: NEGATIVE
Protein, ur: 30 mg/dL — AB
RBC / HPF: >50 RBC/hpf — ABNORMAL HIGH (ref 0–5)
Specific Gravity, Urine: 1.019 (ref 1.005–1.030)
pH: 5 (ref 5.0–8.0)

## 2020-02-02 LAB — COMPREHENSIVE METABOLIC PANEL
ALT: 9 U/L (ref 0–44)
AST: 17 U/L (ref 15–41)
Albumin: 3.5 g/dL (ref 3.5–5.0)
Alkaline Phosphatase: 42 U/L (ref 38–126)
Anion gap: 13 (ref 5–15)
BUN: 18 mg/dL (ref 8–23)
CO2: 23 mmol/L (ref 22–32)
Calcium: 8.9 mg/dL (ref 8.9–10.3)
Chloride: 104 mmol/L (ref 98–111)
Creatinine, Ser: 1.4 mg/dL — ABNORMAL HIGH (ref 0.61–1.24)
GFR calc Af Amer: 57 mL/min — ABNORMAL LOW (ref >60)
GFR calc non Af Amer: 49 mL/min — ABNORMAL LOW (ref >60)
Glucose, Bld: 124 mg/dL — ABNORMAL HIGH (ref 70–99)
Potassium: 4 mmol/L (ref 3.5–5.1)
Sodium: 140 mmol/L (ref 135–145)
Total Bilirubin: 0.5 mg/dL (ref 0.3–1.2)
Total Protein: 5.8 g/dL — ABNORMAL LOW (ref 6.5–8.1)

## 2020-02-02 LAB — CBC
HCT: 35.9 % — ABNORMAL LOW (ref 39.0–52.0)
Hemoglobin: 11.2 g/dL — ABNORMAL LOW (ref 13.0–17.0)
MCH: 29.2 pg (ref 26.0–34.0)
MCHC: 31.2 g/dL (ref 30.0–36.0)
MCV: 93.7 fL (ref 80.0–100.0)
Platelets: 186 10*3/uL (ref 150–400)
RBC: 3.83 MIL/uL — ABNORMAL LOW (ref 4.22–5.81)
RDW: 14.3 % (ref 11.5–15.5)
WBC: 7.4 10*3/uL (ref 4.0–10.5)
nRBC: 0 % (ref 0.0–0.2)

## 2020-02-02 LAB — HEMOGLOBIN A1C
Hgb A1c MFr Bld: 8.9 % — ABNORMAL HIGH (ref 4.8–5.6)
Mean Plasma Glucose: 208.73 mg/dL

## 2020-02-02 LAB — GLUCOSE, CAPILLARY
Glucose-Capillary: 212 mg/dL — ABNORMAL HIGH (ref 70–99)
Glucose-Capillary: 170 mg/dL — ABNORMAL HIGH (ref 70–99)

## 2020-02-02 LAB — LACTIC ACID, PLASMA
Lactic Acid, Venous: 5.7 mmol/L (ref 0.5–1.9)
Lactic Acid, Venous: 7.2 mmol/L (ref 0.5–1.9)
Lactic Acid, Venous: 4.6 mmol/L (ref 0.5–1.9)
Lactic Acid, Venous: 3.3 mmol/L (ref 0.5–1.9)
Lactic Acid, Venous: 2.7 mmol/L (ref 0.5–1.9)
Lactic Acid, Venous: 4.4 mmol/L (ref 0.5–1.9)

## 2020-02-02 LAB — SARS CORONAVIRUS 2 BY RT PCR (HOSPITAL ORDER, PERFORMED IN ~~LOC~~ HOSPITAL LAB): SARS Coronavirus 2: NEGATIVE

## 2020-02-02 LAB — MRSA PCR SCREENING: MRSA by PCR: NEGATIVE

## 2020-02-02 LAB — CBG MONITORING, ED: Glucose-Capillary: 244 mg/dL — ABNORMAL HIGH (ref 70–99)

## 2020-02-02 LAB — APTT: aPTT: 30 seconds (ref 24–36)

## 2020-02-02 LAB — PROTIME-INR
INR: 1 (ref 0.8–1.2)
Prothrombin Time: 13.1 seconds (ref 11.4–15.2)

## 2020-02-02 MED ORDER — HYDRALAZINE HCL 25 MG PO TABS
50.0000 mg | ORAL_TABLET | Freq: Three times a day (TID) | ORAL | Status: DC
  Administered 2020-02-02 – 2020-02-04 (×4): 50 mg via ORAL
  Filled 2020-02-02 (×5): qty 2

## 2020-02-02 MED ORDER — INSULIN GLARGINE 100 UNIT/ML ~~LOC~~ SOLN
10.0000 [IU] | Freq: Every day | SUBCUTANEOUS | Status: DC
  Administered 2020-02-02 – 2020-02-04 (×2): 10 [IU] via SUBCUTANEOUS
  Filled 2020-02-02 (×6): qty 0.1

## 2020-02-02 MED ORDER — INSULIN ASPART 100 UNIT/ML ~~LOC~~ SOLN
0.0000 [IU] | Freq: Three times a day (TID) | SUBCUTANEOUS | Status: DC
  Administered 2020-02-02: 3 [IU] via SUBCUTANEOUS
  Administered 2020-02-04: 1 [IU] via SUBCUTANEOUS
  Administered 2020-02-04 (×2): 3 [IU] via SUBCUTANEOUS

## 2020-02-02 MED ORDER — ATORVASTATIN CALCIUM 40 MG PO TABS
40.0000 mg | ORAL_TABLET | Freq: Every evening | ORAL | Status: DC
  Administered 2020-02-02 – 2020-02-04 (×3): 40 mg via ORAL
  Filled 2020-02-02 (×3): qty 1

## 2020-02-02 MED ORDER — LACTATED RINGERS IV BOLUS (SEPSIS)
1000.0000 mL | Freq: Once | INTRAVENOUS | Status: AC
  Administered 2020-02-02: 1000 mL via INTRAVENOUS

## 2020-02-02 MED ORDER — ONDANSETRON HCL 4 MG PO TABS: 4.0000 mg | ORAL_TABLET | Freq: Four times a day (QID) | ORAL | Status: DC | PRN

## 2020-02-02 MED ORDER — LORAZEPAM 2 MG/ML IJ SOLN
0.5000 mg | Freq: Once | INTRAMUSCULAR | Status: AC
  Administered 2020-02-02: 0.5 mg via INTRAVENOUS
  Filled 2020-02-02: qty 1

## 2020-02-02 MED ORDER — DEXMEDETOMIDINE HCL IN NACL 400 MCG/100ML IV SOLN
0.4000 ug/kg/h | INTRAVENOUS | Status: DC
  Administered 2020-02-02: 0.6 ug/kg/h via INTRAVENOUS
  Administered 2020-02-03: 0.7 ug/kg/h via INTRAVENOUS
  Filled 2020-02-02 (×2): qty 100

## 2020-02-02 MED ORDER — HALOPERIDOL LACTATE 5 MG/ML IJ SOLN
5.0000 mg | Freq: Four times a day (QID) | INTRAMUSCULAR | Status: DC | PRN
  Administered 2020-02-02: 5 mg via INTRAVENOUS
  Filled 2020-02-02: qty 1

## 2020-02-02 MED ORDER — ENOXAPARIN SODIUM 40 MG/0.4ML ~~LOC~~ SOLN
40.0000 mg | SUBCUTANEOUS | Status: DC
  Administered 2020-02-02 – 2020-02-04 (×3): 40 mg via SUBCUTANEOUS
  Filled 2020-02-02 (×3): qty 0.4

## 2020-02-02 MED ORDER — FOLIC ACID 1 MG PO TABS
1.0000 mg | ORAL_TABLET | Freq: Every day | ORAL | Status: DC
  Administered 2020-02-02 – 2020-02-04 (×2): 1 mg via ORAL
  Filled 2020-02-02 (×2): qty 1

## 2020-02-02 MED ORDER — LORAZEPAM 2 MG/ML IJ SOLN: 0.5000 mg | Freq: Once | INTRAMUSCULAR | Status: DC

## 2020-02-02 MED ORDER — INSULIN ASPART 100 UNIT/ML ~~LOC~~ SOLN
3.0000 [IU] | Freq: Three times a day (TID) | SUBCUTANEOUS | Status: DC
  Administered 2020-02-02 – 2020-02-04 (×5): 3 [IU] via SUBCUTANEOUS

## 2020-02-02 MED ORDER — ACETAMINOPHEN 325 MG PO TABS: 650.0000 mg | ORAL_TABLET | Freq: Four times a day (QID) | ORAL | Status: DC | PRN

## 2020-02-02 MED ORDER — VANCOMYCIN HCL IN DEXTROSE 1-5 GM/200ML-% IV SOLN: 1000.0000 mg | INTRAVENOUS | Status: DC

## 2020-02-02 MED ORDER — GABAPENTIN 300 MG PO CAPS
300.0000 mg | ORAL_CAPSULE | Freq: Three times a day (TID) | ORAL | Status: DC
  Administered 2020-02-02 – 2020-02-04 (×6): 300 mg via ORAL
  Filled 2020-02-02 (×6): qty 1

## 2020-02-02 MED ORDER — HALOPERIDOL LACTATE 5 MG/ML IJ SOLN
5.0000 mg | Freq: Once | INTRAMUSCULAR | Status: AC
  Administered 2020-02-02: 5 mg via INTRAVENOUS
  Filled 2020-02-02: qty 1

## 2020-02-02 MED ORDER — SODIUM CHLORIDE 0.9 % IV SOLN: INTRAVENOUS | Status: DC

## 2020-02-02 MED ORDER — SODIUM CHLORIDE 0.9 % IV SOLN
2.0000 g | Freq: Two times a day (BID) | INTRAVENOUS | Status: DC
  Administered 2020-02-02 – 2020-02-03 (×2): 2 g via INTRAVENOUS
  Filled 2020-02-02 (×2): qty 2

## 2020-02-02 MED ORDER — VANCOMYCIN HCL 1500 MG/300ML IV SOLN
1500.0000 mg | Freq: Once | INTRAVENOUS | Status: AC
  Administered 2020-02-02: 1500 mg via INTRAVENOUS
  Filled 2020-02-02: qty 300

## 2020-02-02 MED ORDER — SODIUM CHLORIDE 0.9 % IV BOLUS
500.0000 mL | Freq: Once | INTRAVENOUS | Status: AC
  Administered 2020-02-02: 500 mL via INTRAVENOUS

## 2020-02-02 MED ORDER — SENNOSIDES-DOCUSATE SODIUM 8.6-50 MG PO TABS
1.0000 | ORAL_TABLET | Freq: Two times a day (BID) | ORAL | Status: DC
  Administered 2020-02-02 – 2020-02-04 (×2): 1 via ORAL
  Filled 2020-02-02 (×8): qty 1

## 2020-02-02 MED ORDER — LEVOTHYROXINE SODIUM 137 MCG PO TABS
137.0000 ug | ORAL_TABLET | Freq: Every day | ORAL | Status: DC
  Filled 2020-02-02 (×2): qty 1

## 2020-02-02 MED ORDER — LORAZEPAM 0.5 MG PO TABS
0.5000 mg | ORAL_TABLET | ORAL | Status: DC | PRN
  Administered 2020-02-02 – 2020-02-03 (×3): 0.5 mg via ORAL
  Filled 2020-02-02 (×3): qty 1

## 2020-02-02 MED ORDER — IOHEXOL 350 MG/ML SOLN
100.0000 mL | Freq: Once | INTRAVENOUS | Status: AC | PRN
  Administered 2020-02-02: 100 mL via INTRAVENOUS

## 2020-02-02 MED ORDER — FLUDROCORTISONE ACETATE 0.1 MG PO TABS
0.1000 mg | ORAL_TABLET | Freq: Every day | ORAL | Status: DC
  Administered 2020-02-04: 0.1 mg via ORAL
  Filled 2020-02-02 (×5): qty 1

## 2020-02-02 MED ORDER — ONDANSETRON HCL 4 MG/2ML IJ SOLN: 4.0000 mg | Freq: Four times a day (QID) | INTRAMUSCULAR | Status: DC | PRN

## 2020-02-02 MED ORDER — TRAZODONE HCL 50 MG PO TABS
50.0000 mg | ORAL_TABLET | Freq: Every evening | ORAL | Status: DC | PRN
  Administered 2020-02-02 – 2020-02-03 (×2): 50 mg via ORAL
  Filled 2020-02-02 (×2): qty 1

## 2020-02-02 MED ORDER — LORAZEPAM 2 MG/ML IJ SOLN: 0.5000 mg | Freq: Once | INTRAMUSCULAR | Status: AC

## 2020-02-02 MED ORDER — CHLORHEXIDINE GLUCONATE CLOTH 2 % EX PADS
6.0000 | MEDICATED_PAD | Freq: Every day | CUTANEOUS | Status: DC
  Administered 2020-02-02 – 2020-02-04 (×3): 6 via TOPICAL

## 2020-02-02 MED ORDER — ACETAMINOPHEN 650 MG RE SUPP: 650.0000 mg | Freq: Four times a day (QID) | RECTAL | Status: DC | PRN

## 2020-02-02 MED ORDER — PANCRELIPASE (LIP-PROT-AMYL) 36000-114000 UNITS PO CPEP
36000.0000 [IU] | ORAL_CAPSULE | Freq: Three times a day (TID) | ORAL | Status: DC
  Administered 2020-02-02 – 2020-02-04 (×5): 36000 [IU] via ORAL
  Filled 2020-02-02 (×11): qty 1

## 2020-02-02 MED ORDER — THIAMINE HCL 100 MG PO TABS
100.0000 mg | ORAL_TABLET | Freq: Every day | ORAL | Status: DC
  Administered 2020-02-02 – 2020-02-04 (×2): 100 mg via ORAL
  Filled 2020-02-02 (×2): qty 1

## 2020-02-02 MED ORDER — ADULT MULTIVITAMIN W/MINERALS CH
1.0000 | ORAL_TABLET | Freq: Every day | ORAL | Status: DC
  Administered 2020-02-02 – 2020-02-04 (×2): 1 via ORAL
  Filled 2020-02-02 (×2): qty 1

## 2020-02-02 MED ORDER — INSULIN ASPART 100 UNIT/ML ~~LOC~~ SOLN: 0.0000 [IU] | Freq: Every day | SUBCUTANEOUS | Status: DC

## 2020-02-02 MED ORDER — LACTATED RINGERS IV BOLUS (SEPSIS)
250.0000 mL | Freq: Once | INTRAVENOUS | Status: AC
  Administered 2020-02-02: 250 mL via INTRAVENOUS

## 2020-02-02 MED ORDER — LABETALOL HCL 5 MG/ML IV SOLN
10.0000 mg | INTRAVENOUS | Status: DC | PRN
  Administered 2020-02-02: 10 mg via INTRAVENOUS
  Filled 2020-02-02: qty 4

## 2020-02-02 MED ORDER — POLYETHYLENE GLYCOL 3350 17 G PO PACK: 17.0000 g | PACK | Freq: Every day | ORAL | Status: DC | PRN

## 2020-02-02 MED ORDER — OLANZAPINE 5 MG PO TABS
15.0000 mg | ORAL_TABLET | Freq: Every evening | ORAL | Status: DC
  Administered 2020-02-02 – 2020-02-04 (×3): 15 mg via ORAL
  Filled 2020-02-02 (×3): qty 3

## 2020-02-02 MED ORDER — SERTRALINE HCL 50 MG PO TABS
50.0000 mg | ORAL_TABLET | Freq: Every day | ORAL | Status: DC
  Administered 2020-02-02 – 2020-02-04 (×2): 50 mg via ORAL
  Filled 2020-02-02 (×2): qty 1

## 2020-02-02 MED ORDER — LORAZEPAM 2 MG/ML IJ SOLN
INTRAMUSCULAR | Status: AC
  Administered 2020-02-02: 0.5 mg via INTRAMUSCULAR
  Filled 2020-02-02: qty 1

## 2020-02-02 MED ORDER — SODIUM CHLORIDE 0.9 % IV SOLN
2.0000 g | Freq: Once | INTRAVENOUS | Status: AC
  Administered 2020-02-02: 2 g via INTRAVENOUS
  Filled 2020-02-02: qty 2

## 2020-02-02 MED ORDER — MEMANTINE HCL 10 MG PO TABS
5.0000 mg | ORAL_TABLET | Freq: Two times a day (BID) | ORAL | Status: DC
  Administered 2020-02-02 – 2020-02-04 (×4): 5 mg via ORAL
  Filled 2020-02-02 (×9): qty 1

## 2020-02-02 NOTE — ED Provider Notes (Signed)
  Provider Note MRN:  [id number]  Arrival date & time: 02/02/20    ED Course and Medical Decision Making  Assumed care from Dr. Lynelle Doctor at shift change.  Hypotension and altered mental status coming from care facility.  Code sepsis was initiated, given empiric broad-spectrum antibiotics, blood pressure improving with fluids.  Patient having bouts of agitation requiring Ativan.  Rising lactate triggering CT dissection imaging which is unremarkable.  Patient is currently calm, resting, normal vital signs, will admit to hospital service for dehydration, AKI, likely UTI, encephalopathy.  .Critical Care Performed by: Sabas Sous, MD Authorized by: Sabas Sous, MD   Critical care provider statement:    Critical care time (minutes):  31   Critical care was necessary to treat or prevent imminent or life-threatening deterioration of the following conditions:  Sepsis   Critical care was time spent personally by me on the following activities:  Discussions with consultants, evaluation of patient's response to treatment, examination of patient, ordering and performing treatments and interventions, ordering and review of laboratory studies, ordering and review of radiographic studies, pulse oximetry, re-evaluation of patient's condition, obtaining history from patient or surrogate and review of old charts   I assumed direction of critical care for this patient from another provider in my specialty: yes      Final Clinical Impressions(s) / ED Diagnoses  No diagnosis found.  ED Discharge Orders    None      Discharge Instructions   None     Elmer Sow. Pilar Plate, MD [facility] [email or web address]    Sabas Sous, MD 02/02/20 [phone]
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
## 2020-02-02 NOTE — Progress Notes (Signed)
Pharmacy Antibiotic Note  Joseph Bowen is a 74 y.o. male admitted on 02/01/2020 with sepsis.  Pharmacy has been consulted for Vancomycin and Cefepime dosing.  Plan: Vancomycin 1500 mg IV x 1 dose. Vancomycin 1000 mg IV every 24 hours. Expected AUC 484 Cefepime 2000 mg IV every 12 hours. Monitor labs, c/s, and vanco level as indicated.  Height: 5\' 8"  (172.7 cm) Weight: 68 kg (150 lb) IBW/kg (Calculated) : 68.4  Temp (24hrs), Avg:97.5 F (36.4 C), Min:97.2 F (36.2 C), Max:97.6 F (36.4 C)  Recent Labs  Lab 02/02/20 0022 02/02/20 0027 02/02/20 0306 02/02/20 0811  WBC 7.4  --   --   --   CREATININE 1.40*  --   --   --   LATICACIDVEN  --  5.7* 7.2* 4.6*    Estimated Creatinine Clearance: 45.2 mL/min (A) (by C-G formula based on SCr of 1.4 mg/dL (H)).    No Known Allergies  Antimicrobials this admission: Vanco 7/21 >>  Cefepime 7/21 >>    Microbiology results: 7/21 BCx: ngtd 7/21 UCx: pending  7/21 MRSA PCR: pending  Thank you for allowing pharmacy to be a part of this patient's care.  8/21 02/02/2020 11:13 AM
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
## 2020-02-02 NOTE — ED Notes (Addendum)
Joseph Bowen Legal Guardian notified of fall and pending admission. May give information to sister Keo Schirmer. May have visits from [facility]. Joseph also reports patient has severe night terrors and labile blood sugars. After hours number [phone] worker on call.

## 2020-02-02 NOTE — ED Notes (Signed)
Pt in bed with eyes closed, pt voided, changed pt bedding, gown and cleaned pt.  Pt calm and cooperative throughout bed change, d/c soft wrist restraint, pt remains in mits.  Pt following directions.  meds given, pt to ct.

## 2020-02-02 NOTE — Sepsis Progress Note (Signed)
Notified bedside nurse of need to order antibiotics follow up with the MD since it's been 2 hours the sepsis has been called. RN stated pt pulled IV x2 and very confused was given ativan.Marland Kitchen

## 2020-02-02 NOTE — Progress Notes (Signed)
Pharmacy Antibiotic Note  Joseph Bowen is a 74 y.o. male admitted on 02/01/2020 with sepsis.  Pharmacy has been consulted for Cefepime dosing for possible urinary source. WBC WNL. Lactic acid elevated. Mild bump in Scr. Pt combative.   Plan: Cefepime 2g IV q12h Trend WBC, temp, renal function  F/U infectious work-up   Height: 5\' 8"  (172.7 cm) Weight: 68 kg (150 lb) IBW/kg (Calculated) : 68.4  Temp (24hrs), Avg:97.5 F (36.4 C), Min:97.2 F (36.2 C), Max:97.6 F (36.4 C)  Recent Labs  Lab 02/02/20 0022 02/02/20 0027  WBC 7.4  --   CREATININE 1.40*  --   LATICACIDVEN  --  5.7*    Estimated Creatinine Clearance: 45.2 mL/min (A) (by C-G formula based on SCr of 1.4 mg/dL (H)).    No Known Allergies  02/04/20, PharmD, BCPS Clinical Pharmacist Phone: [phone]

## 2020-02-02 NOTE — Sepsis Progress Note (Signed)
Notified provider of need to order antibiotics.   

## 2020-02-02 NOTE — ED Notes (Addendum)
Pt found down, pt found sitting on floor in room, pt has removed mitts and one of his iv's has been dislodged. Dressing placed to former iv site on L forearm,  Cleaned up pt and helped him back into bed, pt moving all extremities no swelling or deformity noted, AC notified, MD texted, sitter now at bedside and pt on a bed alarm.  Mitts replaced

## 2020-02-02 NOTE — ED Notes (Signed)
IV ABX ordered. Unable to get IV at this time

## 2020-02-02 NOTE — ED Notes (Signed)
Pt alert to person and president. Unsure of year and reason for being in hospital  alert and able to follow commands

## 2020-02-02 NOTE — H&P (Addendum)
History and Physical  Joshus Bowen XAJ:[id number] DOB: February 21, 1946 DOA: 02/01/2020  Referring physician: Pilar Plate MD  PCP: Colon Branch, MD   Chief Complaint: Altered mentation   Historian: ED providers, records, pt unable to participate due to condition  HPI: Joseph Bowen is a 74 y.o. 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 to be 83/57.    He was only able to follow some simple commands on arrival and was hypotensive but his BP seemed to respond favorably to IV fluid bolus.  His labs were abnormal with findings of AKI and marked lactic acidosis with initial lactic acid of 5.7 and after fluid boluses lactic acid increased to 7.2.  He was sent for CTA abdomen and pelvis to rule out ischemic bowel disease but the findings were negative.  His urinalysis was positive for RBC>50 and WBC 6-10.  His WBC was 7.4 and his creatinine was 1.40.  His CXR had no findings of active disease.  He became severe agitated in ED requiring sedation with IV ativan and restraints for safety as he had been striking out at staff members.  Sars 2 coronavirus test was negative.  Blood and urine cultures were take and patient was started on broad spectrum antibiotic coverage and admission was requested.    Review of Systems: Pt unable to participate due to current condition   Past Medical History:  Diagnosis Date   Alcohol-induced persisting amnestic disorder (HCC)    Anemia    Anxiety    Atherosclerosis of arteries    Benign prostatic hypertrophy    Chronic pancreatitis (HCC)    Constipation    COPD (chronic obstructive  pulmonary disease) (HCC)    Dementia (HCC)    Dementia (HCC)    due to encephalopathy from diabetic coma 2011.   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   Essential hypertension    Euthyroid sick syndrome    Glaucoma    H/O ETOH abuse    History of falling    Hydrocele    Hypoglycemia    Hypothyroidism    Insomnia    Legally blind    Orthostatic hypotension    Pain in left shoulder    Personal history of alcoholism (HCC)    Post traumatic stress disorder    Psychosis (HCC)    Radiculopathy, cervical region    Retinopathy    Sleep terrors (night terrors)    Syncope and collapse    Type 2 diabetes mellitus (HCC)    Urinary incontinence    Vitamin B deficiency    Vitamin D deficiency    Past Surgical History:  Procedure Laterality Date   BACK SURGERY     removal of melanoma of back.   COLONOSCOPY WITH PROPOFOL N/A 08/15/2015   Dr. Darrick Penna: left colon redundant, pediatric scope used, sigmoid colon woult not adequately insufflate, moderate diverticulosis throughout entire colon, moderate sized IH    EXTRACORPOREAL SHOCK WAVE LITHOTRIPSY Right 11/18/2016   Procedure: RIGHT EXTRACORPOREAL SHOCK WAVE LITHOTRIPSY (ESWL);  Surgeon: Barron Alvine, MD;  Location: [facility];  Service: Urology;  Laterality: Right;  HAND SURGERY Right    middle finger; fractue   HEMORRHOID SURGERY     HERNIA REPAIR Left    inguinal   HIP SURGERY Bilateral    bilateral hip fractures   MOHS SURGERY     Melanoma   Social History:  reports that he has been smoking cigarettes. He started smoking about 58 years ago. He has a 72.00 pack-year smoking history. He has never used smokeless tobacco. He reports that he does not drink alcohol and does not use drugs.  No Known Allergies  Family History  Problem Relation Age of Onset   Heart disease Other    Diabetes Other     Prior to Admission medications   Medication Sig Start Date End  Date Taking? Authorizing Provider  atorvastatin (LIPITOR) 40 MG tablet Take 40 mg by mouth daily.   Yes [provider]  fludrocortisone (FLORINEF) 0.1 MG tablet Take 1 tablet (0.1 mg total) by mouth daily. 08/15/16  Yes Jonelle Sidle, MD  gabapentin (NEURONTIN) 400 MG capsule Take 400 mg by mouth 3 (three) times daily.   Yes [provider]  insulin glargine (LANTUS) 100 UNIT/ML injection Inject 0.09 mLs (9 Units total) into the skin daily. Patient taking differently: Inject 18 Units into the skin daily.  07/30/17  Yes Philip Aspen, Limmie Patricia, MD  Levothyroxine Sodium 137 MCG CAPS Take 137 mcg by mouth daily before breakfast.    Yes [provider]  lipase/protease/amylase (CREON) 36000 UNITS CPEP capsule Take 1 capsule (36,000 Units total) by mouth 3 (three) times daily with meals. 09/10/16  Yes Filbert Schilder, MD  LORazepam (ATIVAN) 0.5 MG tablet Take one tablet by mouth three times daily; Take one tablet by mouth twice daily as needed for breakthrough anxiety Patient taking differently: Take 0.5 mg by mouth 2 (two) times daily.  05/03/13  Yes Sharon Seller, NP  Melatonin 1 MG TABS Take 1 mg by mouth at bedtime.    Yes [provider]  MELATONIN MAXIMUM STRENGTH 5 MG TABS Take 1 tablet by mouth at bedtime. 08/20/19  Yes [provider]  memantine (NAMENDA) 5 MG tablet Take 5 mg by mouth 2 (two) times daily.   Yes [provider]  metFORMIN (GLUCOPHAGE) 1000 MG tablet Take 1,000 mg by mouth 2 (two) times daily with a meal.   Yes [provider]  OLANZapine (ZYPREXA) 5 MG tablet Take 15 mg by mouth every evening.  09/19/19  Yes [provider]  sennosides-docusate sodium (SENOKOT-S) 8.6-50 MG tablet Take 1 tablet by mouth 2 (two) times daily.   Yes [provider]  sertraline (ZOLOFT) 50 MG tablet Take 50 mg by mouth daily.    Yes [provider]  sitaGLIPtin (JANUVIA) 100 MG tablet Take 100 mg by  mouth daily.    Yes [provider]  thiamine 100 MG tablet Take 100 mg by mouth daily.   Yes [provider]  traZODone (DESYREL) 50 MG tablet Take 50 mg by mouth at bedtime.   Yes [provider]  bisacodyl (DULCOLAX) 10 MG suppository Place 10 mg rectally as needed for moderate constipation. Patient not taking: Reported on 02/02/2020    [provider]  cyanocobalamin 1000 MCG tablet Take 1,000 mcg by mouth daily. Patient not taking: Reported on 02/02/2020    [provider]  Dextromethorphan-Quinidine (NUEDEXTA) 20-10 MG CAPS Take 1 capsule by mouth 2 (two) times daily.  Patient not taking: Reported on 02/02/2020  [provider]  divalproex (DEPAKOTE SPRINKLE) 125 MG capsule Take 125-250 mg by mouth 3 (three) times daily. Take 250mg  twice daily and take 125mg  daily at noon Patient not taking: Reported on 02/02/2020    [provider]  docusate sodium (COLACE) 100 MG capsule Take 100 mg by mouth daily as needed for mild constipation. Patient not taking: Reported on 02/02/2020    [provider]  ergocalciferol (VITAMIN D2) 50000 units capsule Take 50,000 Units by mouth every 30 (thirty) days. Patient not taking: Reported on 02/02/2020    [provider]  guaifenesin (ROBITUSSIN) 100 MG/5ML syrup Take 200 mg by mouth every 4 (four) hours as needed for cough. Patient not taking: Reported on 02/02/2020    [provider]  ibuprofen (ADVIL,MOTRIN) 200 MG tablet Take 400 mg by mouth every 6 (six) hours as needed. Patient not taking: Reported on 02/02/2020    [provider]  OLANZapine (ZYPREXA) 2.5 MG tablet Take 2.5-5 mg by mouth 2 (two) times daily. 2.5mg  in the afternoon and 5mg  at bedtime Patient not taking: Reported on 02/02/2020    [provider]  prazosin (MINIPRESS) 2 MG capsule Take 1 mg by mouth at bedtime.  Patient not taking: Reported on 02/02/2020    [provider]    Propylene Glycol (SYSTANE BALANCE) 0.6 % SOLN Place 1 drop into both eyes at bedtime. Patient not taking: Reported on 02/02/2020    [provider]   Physical Exam: Vitals:   02/02/20 0630 02/02/20 0700 02/02/20 0800 02/02/20 0830  BP: (!) 142/83 (!) 153/86 (!) 175/94 (!) 194/94  Pulse:  73 69 68  Resp: 14 11 14 11   Temp:      TempSrc:      SpO2:  96% 97% 97%  Weight:      Height:        General exam: elderly confused male with dementia, moderately built and thin patient, lying comfortably supine on the gurney in no obvious distress.  He is somnolent.  He is in safety restraints.  Head, eyes and ENT: Nontraumatic and normocephalic. Pupils equally reacting to light and accommodation. Oral mucosa dry. Poor dentition. Neck: Supple. No JVD, carotid bruit or thyromegaly. Lymphatics: No lymphadenopathy. Respiratory system: Clear to auscultation. No increased work of breathing.  Shallow breathing.  Cardiovascular system: normal S1 and S2 heard. No JVD, murmurs, gallops, clicks or pedal edema. Gastrointestinal system: Abdomen is nondistended, soft and nontender. Normal bowel sounds heard. No organomegaly or masses appreciated. Central nervous system: somnolent, arousable.  With severe dementia.  No focal neurological deficits. Extremities: Symmetric 5 x 5 power. Peripheral pulses symmetrically felt.  Skin: No rashes or acute findings. Musculoskeletal system: Negative exam. Psychiatry: with severe dementia and agitated.   Labs on Admission:  Basic Metabolic Panel: Recent Labs  Lab 02/02/20 0022  NA 140  K 4.0  CL 104  CO2 23  GLUCOSE 124*  BUN 18  CREATININE 1.40*  CALCIUM 8.9   Liver Function Tests: Recent Labs  Lab 02/02/20 0022  AST 17  ALT 9  ALKPHOS 42  BILITOT 0.5  PROT 5.8*  ALBUMIN 3.5   No results for input(s): LIPASE, AMYLASE in the last 168 hours. No results for input(s): AMMONIA in the last 168 hours. CBC: Recent Labs  Lab 02/02/20 0022  WBC  7.4  HGB 11.2*  HCT 35.9*  MCV 93.7  PLT 186   Cardiac Enzymes: No results for input(s): CKTOTAL, CKMB, CKMBINDEX, TROPONINI in the last 168  hours.  BNP (last 3 results) No results for input(s): PROBNP in the last 8760 hours. CBG: No results for input(s): GLUCAP in the last 168 hours.  Radiological Exams on Admission: DG Chest Port 1 View  Result Date: 02/02/2020 CLINICAL DATA:  Hypotension EXAM: PORTABLE CHEST 1 VIEW COMPARISON:  January 22, 2015 FINDINGS: The heart size and mediastinal contours are within normal limits. There is slight hyperinflation of the upper lung zones. The visualized skeletal structures are unremarkable. IMPRESSION: No active disease. Electronically Signed   By: Jonna Clark M.D.   On: 02/02/2020 01:04   CT Angio Abd/Pel w/ and/or w/o  Result Date: 02/02/2020 CLINICAL DATA:  Delirium.  Elevated lactic acid. EXAM: CTA ABDOMEN AND PELVIS WITHOUT AND WITH CONTRAST TECHNIQUE: Multidetector CT imaging of the abdomen and pelvis was performed using the standard protocol during bolus administration of intravenous contrast. Multiplanar reconstructed images and MIPs were obtained and reviewed to evaluate the vascular anatomy. CONTRAST:  OMNIPAQUE IOHEXOL 350 MG/ML SOLN COMPARISON:  CT abdomen and pelvis 07/29/2017 FINDINGS: VASCULAR Aorta: Diffuse atherosclerosis without evidence of a dissection, aneurysm, or significant stenosis. Celiac: Previous embolization with streak artifact limiting assessment. Gross patency of the splenic and hepatic arteries. SMA: Patent without evidence of aneurysm, dissection, vasculitis or significant stenosis. Renals: Both renal arteries are patent without evidence of aneurysm, dissection, vasculitis, fibromuscular dysplasia or significant stenosis. IMA: Patent with moderate to severe origin stenosis. Inflow: Patent with moderately extensive atherosclerosis. Mild-to-moderate right common iliac artery stenosis. Proximal Outflow: Patent with mild  atherosclerosis. No evidence of significant stenosis or dissection. Veins: No gross abnormality. Review of the MIP images confirms the above findings. NON-VASCULAR Lower chest: Paraseptal emphysema.  Trace right pleural effusion. Hepatobiliary: No focal liver abnormality is seen. The gallbladder is collapsed with a punctate stone in the region of the gallbladder neck. There is no biliary dilatation. Pancreas: Limited assessment due to streak artifact from embolization coils. Chronic pancreatic atrophy and calcifications suggesting chronic pancreatitis with similar hypodensity in the region of the pancreatic head and uncinate process. Spleen: Unremarkable. Adrenals/Urinary Tract: Unremarkable adrenal glands. Punctate bilateral renal calculi. No hydronephrosis. 1.3 cm left renal cyst. Unremarkable bladder. Stomach/Bowel: The stomach is grossly unremarkable. There is a moderate amount of stool in the colon. There is no evidence of bowel obstruction. Diverticulosis is again noted with chronic wall thickening in the proximal sigmoid colon with luminal narrowing also described on a 2017 colonoscopy. The appendix is unremarkable. Lymphatic: No enlarged lymph nodes. Reproductive: Similar prosthetic enlargement which indents the bladder base. Other: No intraperitoneal free fluid or free air. Musculoskeletal: Remote bilateral superior and inferior pubic rami fractures. Chronic mild L1 compression fracture. Focally advanced disc degeneration at L5-S1. IMPRESSION: 1. No evidence of acute mesenteric ischemia or other acute abnormality in the abdomen or pelvis. 2. 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 3. Colonic diverticulosis with similar appearance of chronic sigmoid colon wall thickening. 4. Chronic pancreatitis. 5. Cholelithiasis. 6. Nonobstructing bilateral nephrolithiasis. 7. Trace right pleural effusion. 8. Aortic Atherosclerosis (ICD10-I70.0) and Emphysema (ICD10-J43.9).  Electronically Signed   By: Sebastian Ache M.D.   On: 02/02/2020 07:17   Assessment/Plan Principal Problem:   Sepsis (HCC) Active Problems:   Type 2 diabetes mellitus with hyperglycemia, with long-term current use of insulin (HCC)   Wernicke-Korsakoff syndrome (alcoholic) (HCC)   Memory loss   Closed head injury   Slow transit constipation   Dementia with behavioral disturbance (HCC)   History of alcoholism (HCC)   COPD (chronic  obstructive pulmonary disease) (HCC)   BPH (benign prostatic hyperplasia)   Chronic pancreatitis (HCC)   Essential hypertension   Glaucoma   Vitamin B12 deficiency   AKI (acute kidney injury) (HCC)   UTI (urinary tract infection)   Lactic acidosis   Severe dehydration   Agitation due to dementia (HCC)  Sepsis of unknown cause - presumptive diagnosis, treating with IV fluids, IV antibiotics and supportive care.  Follow lactic acid levels.  Check MRSA screen.  Lactic acidosis - repeat lactic acid is 4.7 which is coming down from earlier, continue IV fluid hydration and follow.   UTI - continue broad spectrum antibiotics for now.  Follow urine culture.  Severe dehydration - IV fluids as ordered.  Dementia (severe) with behavioral disturbances - still awaiting for home meds to be reconciled, ordered IV haldol as needed for agitation.  Safety restraints as needed for safety of patient and staff members.  COPD - stable. Follow clinically.  Essential hypertension - resume home meds and follow.  BPH - follow and cath as needed if unable to void.  History of chronic alcoholism and wernicke korsakoff syndrome - resume vitamin supplements.  Hypotension - responded well to IV fluid hydration.  Uncontrolled type 2 diabetes mellitus with hyperglycemia- see insulin orders   Critical Care Procedure Note Authorized and Performed by: Maryln Manuel MD  Total Critical Care time:  60 minutes  Due to a high probability of clinically significant, life threatening deterioration,  the patient required my highest level of preparedness to intervene emergently and I personally spent this critical care time directly and personally managing the patient.  This critical care time included obtaining a history; examining the patient, pulse oximetry; ordering and review of studies; arranging urgent treatment with development of a management plan; evaluation of patient's response of treatment; frequent reassessment; and discussions with other providers.  This critical care time was performed to assess and manage the high probability of imminent and life threatening deterioration that could result in multi-organ failure.  It was exclusive of separately billable procedures and treating other patients and teaching time.   DVT Prophylaxis: lovenox  Code Status: full   Family Communication: none present   Disposition Plan: return to SNF when medically stabilized     Standley Dakins, MD [facility] How to contact the [facility] Attending or Consulting provider 7A - 7P or covering provider during after hours 7P -7A, for this patient?  Check the care team in [facility] and look for a) attending/consulting [facility] provider listed and b) the [facility] team listed Log into [email or web address] and use [facility] universal password to access. If you do not have the password, please contact the hospital operator. Locate the [facility] provider you are looking for under [facility] and page to a number that you can be directly reached. If you still have difficulty reaching the provider, please page the [facility] (Director on Call) for the Hospitalists listed on amion for assistance.

## 2020-02-02 NOTE — ED Notes (Signed)
Lab in room.

## 2020-02-02 NOTE — ED Notes (Signed)
Date and time results received: 02/02/20 1:04 AM     (use smartphrase ".now" to insert current time)  Test: lactic Critical Value: 5.7  Name of Provider Notified: knapp  Orders Received? Or Actions Taken?: Orders Received - See Orders for details

## 2020-02-02 NOTE — ED Provider Notes (Addendum)
[facility] EMERGENCY DEPARTMENT Provider Note   CSN: [id number] Arrival date & time: 02/01/20  2345  Time seen 12:23 AM    History Chief Complaint  Patient presents with  . Altered Mental Status    hypotensive   Level 5 caveat for dementia  Joseph Bowen is a 74 y.o. male.  HPI   When I going to see patient he states he does not feel bad, he denies having any pain.  He denies having nausea, vomiting, or diarrhea.  EMS reports they were called to [facility], his facility for altered mental status.  They state he was hypotensive and they gave him 500 cc of normal saline with a blood pressure of 83/57.  PCP Colon Branch, MD   Past Medical History:  Diagnosis Date  . Alcohol-induced persisting amnestic disorder (HCC)   . Anemia   . Anxiety   . Atherosclerosis of arteries   . Benign prostatic hypertrophy   . Chronic pancreatitis (HCC)   . Constipation   . COPD (chronic obstructive pulmonary disease) (HCC)   . Dementia (HCC)   . Dementia (HCC)    due to encephalopathy from diabetic coma 2011.  . Depression   . Difficulty walking   . Dizziness and giddiness   . 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  . Essential hypertension   . Euthyroid sick syndrome   . Glaucoma   . H/O ETOH abuse   . History of falling   . Hydrocele   . Hypoglycemia   . Hypothyroidism   . Insomnia   . Legally blind   . Orthostatic hypotension   . Pain in left shoulder   . Personal history of alcoholism (HCC)   . Post traumatic stress disorder   . Psychosis (HCC)   . Radiculopathy, cervical region   . Retinopathy   . Sleep terrors (night terrors)   . Syncope and collapse   . Type 2 diabetes mellitus (HCC)   . Urinary incontinence   . Vitamin B deficiency   . Vitamin D deficiency     Patient Active Problem List   Diagnosis Date Noted  . History of alcoholism (HCC) 02/09/2017  . Post traumatic stress disorder 02/09/2017  . Hydrocele 02/09/2017  . Impacted cerumen  of right ear 02/09/2017  . Contusion of right upper back excluding scapular region 02/08/2017  . COPD (chronic obstructive pulmonary disease) (HCC) 01/18/2017  . BPH (benign prostatic hyperplasia) 01/18/2017  . Chronic pancreatitis (HCC) 01/18/2017  . Essential hypertension 01/18/2017  . Glaucoma 01/18/2017  . Hypothyroidism 01/18/2017  . Solitary pulmonary nodule 01/18/2017  . Vitamin B12 deficiency 01/18/2017  . Dementia with behavioral disturbance (HCC) 01/17/2017  . Slow transit constipation 09/04/2016  . Left cervical radiculopathy 07/26/2016  . Closed head injury 10/20/2015  . Fall 10/20/2015  . Heme + stool 07/27/2015  . 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 Memory loss 10/31/2014  . Dizziness 07/04/2014  . Syncope 07/04/2014  . Type 2 diabetes mellitus with hyperglycemia, with long-term current use of insulin (HCC) 07/04/2014  . Fracture, pelvis closed (HCC) 12/01/2013  . Proximal phalanx fracture of finger 12/01/2013  . Fx metacarpal neck-closed 12/01/2013  . Pelvis fracture (HCC) 11/10/2012  . Pelvic fracture (HCC) 09/23/2012  . Dislocation of PIP joint of finger 09/23/2012  . Dupuytren's disease of palm 09/02/2012    Past Surgical History:  Procedure Laterality Date  . BACK SURGERY     removal of melanoma of back.  . COLONOSCOPY  WITH PROPOFOL N/A 08/15/2015   Dr. Darrick Penna: left colon redundant, pediatric scope used, sigmoid colon woult not adequately insufflate, moderate diverticulosis throughout entire colon, moderate sized IH   . EXTRACORPOREAL SHOCK WAVE LITHOTRIPSY Right 11/18/2016   Procedure: RIGHT EXTRACORPOREAL SHOCK WAVE LITHOTRIPSY (ESWL);  Surgeon: Barron Alvine, MD;  Location: [facility]S;  Service: Urology;  Laterality: Right;  . HAND SURGERY Right    middle finger; fractue  . HEMORRHOID SURGERY    . HERNIA REPAIR Left    inguinal  . HIP SURGERY Bilateral    bilateral hip fractures  . MOHS SURGERY     Melanoma       Family History   Problem Relation Age of Onset  . Heart disease Other   . Diabetes Other     Social History   Tobacco Use  . Smoking status: Current Some Day Smoker    Packs/day: 1.50    Years: 48.00    Pack years: 72.00    Types: Cigarettes    Start date: 09/17/1961    Last attempt to quit: 09/17/2009    Years since quitting: 10.3  . Smokeless tobacco: Never Used  Substance Use Topics  . Alcohol use: No    Alcohol/week: 0.0 standard drinks    Comment: hx of alcoholism-last used 2011  . Drug use: No  lives in a NH  Home Medications Prior to Admission medications   Medication Sig Start Date End Date Taking? Authorizing Provider  atorvastatin (LIPITOR) 40 MG tablet Take 40 mg by mouth daily.    [provider]  bisacodyl (DULCOLAX) 10 MG suppository Place 10 mg rectally as needed for moderate constipation.    [provider]  cyanocobalamin 1000 MCG tablet Take 1,000 mcg by mouth daily.    [provider]  Dextromethorphan-Quinidine (NUEDEXTA) 20-10 MG CAPS Take 1 capsule by mouth 2 (two) times daily.     [provider]  divalproex (DEPAKOTE SPRINKLE) 125 MG capsule Take 125-250 mg by mouth 3 (three) times daily. Take  twice daily and take  daily at noon    [provider]  docusate sodium (COLACE) 100 MG capsule Take 100 mg by mouth daily as needed for mild constipation.    [provider]  ergocalciferol (VITAMIN D2) 50000 units capsule Take 50,000 Units by mouth every 30 (thirty) days.    [provider]  fludrocortisone (FLORINEF) 0.1 MG tablet Take 1 tablet (0.1 mg total) by mouth daily. 08/15/16   Jonelle Sidle, MD  gabapentin (NEURONTIN) 400 MG capsule Take 400 mg by mouth 3 (three) times daily.    [provider]  guaifenesin (ROBITUSSIN) 100 MG/5ML syrup Take 200 mg by mouth every 4 (four) hours as needed for cough.    [provider]  ibuprofen (ADVIL,MOTRIN) 200 MG tablet Take 400 mg by mouth  every 6 (six) hours as needed.    [provider]  insulin glargine (LANTUS) 100 UNIT/ML injection Inject 0.09 mLs (9 Units total) into the skin daily. Patient taking differently: Inject 22 Units into the skin daily.  07/30/17   Philip Aspen, Limmie Patricia, MD  Levothyroxine Sodium 137 MCG CAPS Take 137 mcg by mouth daily before breakfast.     [provider]  lipase/protease/amylase (CREON) 36000 UNITS CPEP capsule Take 1 capsule (36,000 Units total) by mouth 3 (three) times daily with meals. 09/10/16   Filbert Schilder, MD  LORazepam (ATIVAN) 0.5 MG tablet Take one tablet by mouth three times daily; Take one  tablet by mouth twice daily as needed for breakthrough anxiety Patient taking differently: Take 0.5 mg by mouth daily as needed for anxiety.  05/03/13   Sharon Seller, NP  Melatonin 1 MG TABS Take 6 mg by mouth at bedtime.     [provider]  MELATONIN MAXIMUM STRENGTH 5 MG TABS Take 1 tablet by mouth at bedtime. 08/20/19   [provider]  memantine (NAMENDA) 5 MG tablet Take 5 mg by mouth 2 (two) times daily.    [provider]  metFORMIN (GLUCOPHAGE) 1000 MG tablet Take 1,000 mg by mouth 2 (two) times daily with a meal.    [provider]  OLANZapine (ZYPREXA) 2.5 MG tablet Take 2.5-5 mg by mouth 2 (two) times daily. 2.5mg  in the afternoon and 5mg  at bedtime    [provider]  OLANZapine (ZYPREXA) 5 MG tablet Take 5 mg by mouth at bedtime. 09/19/19   [provider]  prazosin (MINIPRESS) 2 MG capsule Take 1 mg by mouth at bedtime.     [provider]  Propylene Glycol (SYSTANE BALANCE) 0.6 % SOLN Place 1 drop into both eyes at bedtime.    [provider]  sennosides-docusate sodium (SENOKOT-S) 8.6-50 MG tablet Take 1 tablet by mouth 2 (two) times daily.    [provider]  sertraline (ZOLOFT) 50 MG tablet Take 50 mg by mouth daily.     [provider]  sitaGLIPtin (JANUVIA) 100  MG tablet Take 100 mg by mouth daily.     [provider]  tamsulosin (FLOMAX) 0.4 MG CAPS capsule Take 0.4 mg by mouth at bedtime.     [provider]  thiamine 100 MG tablet Take 100 mg by mouth daily.    [provider]  traZODone (DESYREL) 50 MG tablet Take 50 mg by mouth at bedtime.    [provider]  venlafaxine XR (EFFEXOR-XR) 37.5 MG 24 hr capsule Take 37.5 mg by mouth every other day.    [provider]    Allergies    Patient has no known allergies.  Review of Systems   Review of Systems  All other systems reviewed and are negative.   Physical Exam Updated Vital Signs BP (!) 153/86   Pulse 73   Temp (!) 97.2 F (36.2 C)   Resp 11   Ht 5\' 8"  (1.727 m)   Wt 68 kg   SpO2 96%   BMI 22.81 kg/m   Physical Exam Vitals and nursing note reviewed.  Constitutional:      General: He is not in acute distress.    Appearance: Normal appearance. He is normal weight.  HENT:     Head: Normocephalic and atraumatic.     Nose: Nose normal.     Mouth/Throat:     Mouth: Mucous membranes are dry.  Eyes:     Extraocular Movements: Extraocular movements intact.     Conjunctiva/sclera: Conjunctivae normal.     Pupils: Pupils are equal, round, and reactive to light.  Cardiovascular:     Rate and Rhythm: Normal rate and regular rhythm.     Pulses: Normal pulses.     Heart sounds: Normal heart sounds.  Pulmonary:     Effort: Pulmonary effort is normal. No respiratory distress.     Breath sounds: Normal breath sounds.  Abdominal:     General: Abdomen is flat. Bowel sounds are normal.     Palpations: Abdomen is soft.     Tenderness: There is no  abdominal tenderness.  Musculoskeletal:     Cervical back: Normal range of motion.  Skin:    General: Skin is dry.     Coloration: Skin is pale.  Neurological:     General: No focal deficit present.     Mental Status: He is alert.     Cranial Nerves: No cranial nerve deficit.  Psychiatric:         Mood and Affect: Affect is flat.        Speech: Speech is delayed.        Behavior: Behavior is slowed.     ED Results / Procedures / Treatments   Labs (all labs ordered are listed, but only abnormal results are displayed) Results for orders placed or performed during the hospital encounter of 02/01/20  Blood Culture (routine x 2)   Specimen: BLOOD  Result Value Ref Range   Specimen Description BLOOD LEFT ANTECUBITAL    Special Requests      BOTTLES DRAWN AEROBIC AND ANAEROBIC Blood Culture results may not be optimal due to an excessive volume of blood received in culture bottles   Culture      NO GROWTH < 12 HOURS Performed at [facility], [address], [place], [place] [address]    Report Status PENDING   Blood Culture (routine x 2)   Specimen: BLOOD  Result Value Ref Range   Specimen Description BLOOD RIGHT ANTECUBITAL    Special Requests      BOTTLES DRAWN AEROBIC AND ANAEROBIC Blood Culture adequate volume   Culture      NO GROWTH < 12 HOURS Performed at [facility], [address], [place], [place] [address]    Report Status PENDING   SARS Coronavirus 2 by RT PCR (hospital order, performed in [facility] hospital lab) Nasopharyngeal Nasopharyngeal Swab   Specimen: Nasopharyngeal Swab  Result Value Ref Range   SARS Coronavirus 2 NEGATIVE NEGATIVE  CBC  Result Value Ref Range   WBC 7.4 4.0 - 10.5 K/uL   RBC 3.83 (L) 4.22 - 5.81 MIL/uL   Hemoglobin 11.2 (L) 13.0 - 17.0 g/dL   HCT 99.2 (L) 39 - 52 %   MCV 93.7 80.0 - 100.0 fL   MCH 29.2 26.0 - 34.0 pg   MCHC 31.2 30.0 - 36.0 g/dL   RDW 42.6 83.4 - 19.6 %   Platelets 186 150 - 400 K/uL   nRBC 0.0 0.0 - 0.2 %  Lactic acid, plasma  Result Value Ref Range   Lactic Acid, Venous 5.7 (HH) 0.5 - 1.9 mmol/L  Lactic acid, plasma  Result Value Ref Range   Lactic Acid, Venous 7.2 (HH) 0.5 - 1.9 mmol/L  Comprehensive metabolic panel  Result Value Ref Range   Sodium 140 135 - 145 mmol/L   Potassium 4.0 3.5 - 5.1  mmol/L   Chloride 104 98 - 111 mmol/L   CO2 23 22 - 32 mmol/L   Glucose, Bld 124 (H) 70 - 99 mg/dL   BUN 18 8 - 23 mg/dL   Creatinine, Ser 2.22 (H) 0.61 - 1.24 mg/dL   Calcium 8.9 8.9 - 97.9 mg/dL   Total Protein 5.8 (L) 6.5 - 8.1 g/dL   Albumin 3.5 3.5 - 5.0 g/dL   AST 17 15 - 41 U/L   ALT 9 0 - 44 U/L   Alkaline Phosphatase 42 38 - 126 U/L   Total Bilirubin 0.5 0.3 - 1.2 mg/dL   GFR calc non Af Amer 49 (L) >60 mL/min  GFR calc Af Amer 57 (L) >60 mL/min   Anion gap 13 5 - 15  APTT  Result Value Ref Range   aPTT 30 24 - 36 seconds  Protime-INR  Result Value Ref Range   Prothrombin Time 13.1 11.4 - 15.2 seconds   INR 1.0 0.8 - 1.2  Urinalysis, Routine w reflex microscopic  Result Value Ref Range   Color, Urine YELLOW YELLOW   APPearance HAZY (A) CLEAR   Specific Gravity, Urine 1.019 1.005 - 1.030   pH 5.0 5.0 - 8.0   Glucose, UA 50 (A) NEGATIVE mg/dL   Hgb urine dipstick LARGE (A) NEGATIVE   Bilirubin Urine NEGATIVE NEGATIVE   Ketones, ur 5 (A) NEGATIVE mg/dL   Protein, ur 30 (A) NEGATIVE mg/dL   Nitrite NEGATIVE NEGATIVE   Leukocytes,Ua NEGATIVE NEGATIVE   RBC / HPF >50 (H) 0 - 5 RBC/hpf   WBC, UA 6-10 0 - 5 WBC/hpf   Bacteria, UA NONE SEEN NONE SEEN   Mucus PRESENT    Hyaline Casts, UA PRESENT    Laboratory interpretation all normal except mild anemia, possible UTI, urine culture sent, renal insufficiency, mild anemia, elevated lactic acid that actually got higher on the repeat    EKG EKG Interpretation  Date/Time:  Tuesday February 01 2020 23:55:47 EDT Ventricular Rate:  67 PR Interval:    QRS Duration: 91 QT Interval:  425 QTC Calculation: 449 R Axis:   79 Text Interpretation: Sinus rhythm Normal ECG No significant change since last tracing 06 Sep 2016 Confirmed by Devoria Albe ([id number]) on 02/02/2020 12:22:41 AM   Radiology DG Chest Port 1 View  Result Date: 02/02/2020 CLINICAL DATA:  Hypotension EXAM: PORTABLE CHEST 1 VIEW COMPARISON:  January 22, 2015  FINDINGS: The heart size and mediastinal contours are within normal limits. There is slight hyperinflation of the upper lung zones. The visualized skeletal structures are unremarkable. IMPRESSION: No active disease. Electronically Signed   By: Jonna Clark M.D.   On: 02/02/2020 01:04   CT Angio Abd/Pel w/ and/or w/o  Result Date: 02/02/2020 CLINICAL DATA:  Delirium.  Elevated lactic acid. EXAM: CTA ABDOMEN AND PELVIS WITHOUT AND WITH CONTRAST TECHNIQUE: Multidetector CT imaging of the abdomen and pelvis was performed using the standard protocol during bolus administration of intravenous contrast. Multiplanar reconstructed images and MIPs were obtained and reviewed to evaluate the vascular anatomy. CONTRAST:  OMNIPAQUE IOHEXOL 350 MG/ML SOLN COMPARISON:  CT abdomen and pelvis 07/29/2017 FINDINGS: VASCULAR Aorta: Diffuse atherosclerosis without evidence of a dissection, aneurysm, or significant stenosis. Celiac: Previous embolization with streak artifact limiting assessment. Gross patency of the splenic and hepatic arteries. SMA: Patent without evidence of aneurysm, dissection, vasculitis or significant stenosis. Renals: Both renal arteries are patent without evidence of aneurysm, dissection, vasculitis, fibromuscular dysplasia or significant stenosis. IMA: Patent with moderate to severe origin stenosis. Inflow: Patent with moderately extensive atherosclerosis. Mild-to-moderate right common iliac artery stenosis. Proximal Outflow: Patent with mild atherosclerosis. No evidence of significant stenosis or dissection. Veins: No gross abnormality. Review of the MIP images confirms the above findings. NON-VASCULAR Lower chest: Paraseptal emphysema.  Trace right pleural effusion. Hepatobiliary: No focal liver abnormality is seen. The gallbladder is collapsed with a punctate stone in the region of the gallbladder neck. There is no biliary dilatation. Pancreas: Limited assessment due to streak artifact from  embolization coils. Chronic pancreatic atrophy and calcifications suggesting chronic pancreatitis with similar hypodensity in the region of the pancreatic head and uncinate process. Spleen: Unremarkable. Adrenals/Urinary Tract: Unremarkable  adrenal glands. Punctate bilateral renal calculi. No hydronephrosis. 1.3 cm left renal cyst. Unremarkable bladder. Stomach/Bowel: The stomach is grossly unremarkable. There is a moderate amount of stool in the colon. There is no evidence of bowel obstruction. Diverticulosis is again noted with chronic wall thickening in the proximal sigmoid colon with luminal narrowing also described on a 2017 colonoscopy. The appendix is unremarkable. Lymphatic: No enlarged lymph nodes. Reproductive: Similar prosthetic enlargement which indents the bladder base. Other: No intraperitoneal free fluid or free air. Musculoskeletal: Remote bilateral superior and inferior pubic rami fractures. Chronic mild L1 compression fracture. Focally advanced disc degeneration at L5-S1. IMPRESSION: 1. No evidence of acute mesenteric ischemia or other acute abnormality in the abdomen or pelvis. 2. 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 3. Colonic diverticulosis with similar appearance of chronic sigmoid colon wall thickening. 4. Chronic pancreatitis. 5. Cholelithiasis. 6. Nonobstructing bilateral nephrolithiasis. 7. Trace right pleural effusion. 8. Aortic Atherosclerosis (ICD10-I70.0) and Emphysema (ICD10-J43.9). Electronically Signed   By: Sebastian Ache M.D.   On: 02/02/2020 07:17    Procedures .Critical Care Performed by: Devoria Albe, MD Authorized by: Devoria Albe, MD   Critical care provider statement:    Critical care time (minutes):  43   Critical care was necessary to treat or prevent imminent or life-threatening deterioration of the following conditions:  Circulatory failure   Critical care was time spent personally by me on the following activities:  Discussions  with consultants, examination of patient, obtaining history from patient or surrogate, ordering and review of laboratory studies, ordering and review of radiographic studies, pulse oximetry and re-evaluation of patient's condition   (including critical care time)  Medications Ordered in ED Medications  ceFEPIme (MAXIPIME) 2 g in sodium chloride 0.9 % 100 mL IVPB (has no administration in time range)  lactated ringers bolus 1,000 mL (0 mLs Intravenous Stopped 02/02/20 0322)    And  lactated ringers bolus 1,000 mL (0 mLs Intravenous Stopped 02/02/20 0514)    And  lactated ringers bolus 250 mL (0 mLs Intravenous Stopped 02/02/20 0422)  LORazepam (ATIVAN) injection 0.5 mg (0.5 mg Intramuscular Given 02/02/20 0254)  ceFEPIme (MAXIPIME) 2 g in sodium chloride 0.9 % 100 mL IVPB (0 g Intravenous Stopped 02/02/20 0514)  iohexol (OMNIPAQUE) 350 MG/ML injection 100 mL (100 mLs Intravenous Contrast Given 02/02/20 0601)  LORazepam (ATIVAN) injection 0.5 mg (0.5 mg Intravenous Given 02/02/20 0542)    ED Course  I have reviewed the triage vital signs and the nursing notes.  Pertinent labs & imaging results that were available during my care of the patient were reviewed by me and considered in my medical decision making (see chart for details).    MDM Rules/Calculators/A&P                          At the time of my exam patient's blood pressure was 76 systolic.  Rectal temp was ordered and patient was started on sepsis protocol with 30 cc/kg IV fluid bolus..  Patient's urine was questionable for UTI, he was started on Maxipime.  2:45 AM nurses report patient had got agitated, he got up out of bed and pulled his IVs out.  He also was getting physically aggressive with nursing staff.  He was given Ativan 0.5 mg IM.  At this point his blood pressure was around 100 systolic and had improved after fluids.  His IV was restarted.  Patient's repeat lactic acid got higher after his IV  fluid bolus.  Patient's blood  pressure actually improved and he became borderline hypotensive with systolic blood pressure 160.  5:284:48 AM patient was discussed with Dr. Sharl MaLama, hospitalist.  He wants patient to have a CT of his abdomen.  Patient is Ativan was repeated to improve his cooperation to get CT scan.  Patient's temperature was rechecked 3 times, twice orally and once rectally and he never developed a fever or became hypothermic.  At this point I am not convinced that he has sepsis.  Patient CT scan did not show any acute abnormality.  Dr. Pilar PlateBero will speak to the dayshift hospitalist about admission.   Final Clinical Impression(s) / ED Diagnoses Final diagnoses:  Hypotension, unspecified hypotension type  Urinary tract infection without hematuria, site unspecified  Lactic acidosis    Rx / DC Orders  Plan admission  Devoria AlbeIva Elsy Chiang, MD, Concha PyoFACEP    Sae Handrich, MD 02/02/20 [id number]    Devoria AlbeKnapp, Emalina Dubreuil, MD 02/02/20 [phone]
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
## 2020-02-02 NOTE — ED Notes (Signed)
CRITICAL VALUE ALERT  Critical Value:  Lactic acid 4.6  Date & Time Notied:  02/02/20, 2446  Provider Notified: johnson  Orders Received/Actions taken: no new orders

## 2020-02-02 NOTE — Progress Notes (Signed)
02/02/2020 4:52 PM  I came back and reassessed patient after his fall in ED.  He says nothing is hurting him now.  No bruises or injuries seen.  Will keep a close watch on him and if he starts complaining of anything will look into it further with more imaging, etc.  Safety sitter ordered.    Maryln Manuel MD

## 2020-02-03 ENCOUNTER — Inpatient Hospital Stay (HOSPITAL_COMMUNITY): Payer: Medicare Other

## 2020-02-03 DIAGNOSIS — I10 Essential (primary) hypertension: Secondary | ICD-10-CM

## 2020-02-03 DIAGNOSIS — T68XXXA Hypothermia, initial encounter: Secondary | ICD-10-CM | POA: Clinically undetermined

## 2020-02-03 DIAGNOSIS — A419 Sepsis, unspecified organism: Secondary | ICD-10-CM

## 2020-02-03 DIAGNOSIS — R6520 Severe sepsis without septic shock: Secondary | ICD-10-CM

## 2020-02-03 DIAGNOSIS — F0391 Unspecified dementia with behavioral disturbance: Secondary | ICD-10-CM

## 2020-02-03 LAB — CBC WITH DIFFERENTIAL/PLATELET
Abs Immature Granulocytes: 0.01 10*3/uL (ref 0.00–0.07)
Basophils Absolute: 0 10*3/uL (ref 0.0–0.1)
Basophils Relative: 0 %
Eosinophils Absolute: 0.1 10*3/uL (ref 0.0–0.5)
Eosinophils Relative: 3 %
HCT: 35.2 % — ABNORMAL LOW (ref 39.0–52.0)
Hemoglobin: 11.3 g/dL — ABNORMAL LOW (ref 13.0–17.0)
Immature Granulocytes: 0 %
Lymphocytes Relative: 23 %
Lymphs Abs: 1.1 10*3/uL (ref 0.7–4.0)
MCH: 29 pg (ref 26.0–34.0)
MCHC: 32.1 g/dL (ref 30.0–36.0)
MCV: 90.5 fL (ref 80.0–100.0)
Monocytes Absolute: 0.4 10*3/uL (ref 0.1–1.0)
Monocytes Relative: 9 %
Neutro Abs: 3.2 10*3/uL (ref 1.7–7.7)
Neutrophils Relative %: 65 %
Platelets: 176 10*3/uL (ref 150–400)
RBC: 3.89 MIL/uL — ABNORMAL LOW (ref 4.22–5.81)
RDW: 14.1 % (ref 11.5–15.5)
WBC: 4.9 10*3/uL (ref 4.0–10.5)
nRBC: 0 % (ref 0.0–0.2)

## 2020-02-03 LAB — COMPREHENSIVE METABOLIC PANEL
ALT: 12 U/L (ref 0–44)
AST: 16 U/L (ref 15–41)
Albumin: 3.3 g/dL — ABNORMAL LOW (ref 3.5–5.0)
Alkaline Phosphatase: 40 U/L (ref 38–126)
Anion gap: 11 (ref 5–15)
BUN: 10 mg/dL (ref 8–23)
CO2: 27 mmol/L (ref 22–32)
Calcium: 8.2 mg/dL — ABNORMAL LOW (ref 8.9–10.3)
Chloride: 106 mmol/L (ref 98–111)
Creatinine, Ser: 0.69 mg/dL (ref 0.61–1.24)
GFR calc Af Amer: >60 mL/min (ref >60)
GFR calc non Af Amer: >60 mL/min (ref >60)
Glucose, Bld: 115 mg/dL — ABNORMAL HIGH (ref 70–99)
Potassium: 3.6 mmol/L (ref 3.5–5.1)
Sodium: 144 mmol/L (ref 135–145)
Total Bilirubin: 0.6 mg/dL (ref 0.3–1.2)
Total Protein: 5.8 g/dL — ABNORMAL LOW (ref 6.5–8.1)

## 2020-02-03 LAB — GLUCOSE, CAPILLARY
Glucose-Capillary: 97 mg/dL (ref 70–99)
Glucose-Capillary: 85 mg/dL (ref 70–99)
Glucose-Capillary: 206 mg/dL — ABNORMAL HIGH (ref 70–99)

## 2020-02-03 LAB — PROCALCITONIN: Procalcitonin: <0.1 ng/mL

## 2020-02-03 LAB — LACTIC ACID, PLASMA: Lactic Acid, Venous: 0.8 mmol/L (ref 0.5–1.9)

## 2020-02-03 LAB — BRAIN NATRIURETIC PEPTIDE: B Natriuretic Peptide: 420 pg/mL — ABNORMAL HIGH (ref 0.0–100.0)

## 2020-02-03 LAB — MAGNESIUM: Magnesium: 1.2 mg/dL — ABNORMAL LOW (ref 1.7–2.4)

## 2020-02-03 MED ORDER — MAGNESIUM SULFATE 4 GM/100ML IV SOLN
4.0000 g | Freq: Once | INTRAVENOUS | Status: AC
  Administered 2020-02-03: 4 g via INTRAVENOUS
  Filled 2020-02-03: qty 100

## 2020-02-03 MED ORDER — LEVOTHYROXINE SODIUM 100 MCG/5ML IV SOLN: 68.5000 ug | Freq: Every day | INTRAVENOUS | Status: DC

## 2020-02-03 MED ORDER — SODIUM CHLORIDE 0.9 % IV SOLN
2.0000 g | Freq: Three times a day (TID) | INTRAVENOUS | Status: AC
  Administered 2020-02-03 – 2020-02-04 (×4): 2 g via INTRAVENOUS
  Filled 2020-02-03 (×4): qty 2

## 2020-02-03 NOTE — TOC Initial Note (Signed)
Transition of Care [facility]) - Initial/Assessment Note    Patient Details  Name: Joseph Bowen MRN: [id number] Date of Birth: 03/02/1946  Transition of Care [facility] CM/SW Contact:    Joseph Needy, LCSW Phone Number: 02/03/2020, 12:27 PM  Clinical Narrative:                 Patient is a LTC resident at [facility]. He is continent and ambulates with a cane sometimes. Patient has a roommate that he gets along with. Discussed that patient could potentially discharge today with Joseph Bowen at [facility].    Expected Discharge Plan: Skilled Nursing Facility Barriers to Discharge: Continued Medical Work up   Patient Goals and CMS Choice        Expected Discharge Plan and Services Expected Discharge Plan: Skilled Nursing Facility       Living arrangements for the past 2 months: Skilled Nursing Facility                                      Prior Living Arrangements/Services Living arrangements for the past 2 months: Skilled Nursing Facility Lives with:: Facility Resident                   Activities of Daily Living Home Assistive Devices/Equipment: None ADL Screening (condition at time of admission) Patient's cognitive ability adequate to safely complete daily activities?: No Is the patient deaf or have difficulty hearing?: No Does the patient have difficulty seeing, even when wearing glasses/contacts?: No Does the patient have difficulty concentrating, remembering, or making decisions?: Yes Patient able to express need for assistance with ADLs?: No Does the patient have difficulty dressing or bathing?: Yes Independently performs ADLs?: No Communication: Dependent Is this a change from baseline?: Pre-admission baseline Dressing (OT): Dependent Is this a change from baseline?: Pre-admission baseline Grooming: Dependent Is this a change from baseline?: Pre-admission baseline Feeding: Dependent Is this a change from baseline?: Pre-admission  baseline Bathing: Dependent Is this a change from baseline?: Pre-admission baseline Toileting: Dependent Is this a change from baseline?: Pre-admission baseline In/Out Bed: Dependent Is this a change from baseline?: Pre-admission baseline Walks in Home: Dependent Is this a change from baseline?: Pre-admission baseline Does the patient have difficulty walking or climbing stairs?: Yes Weakness of Legs: Both Weakness of Arms/Hands: None  Permission Sought/Granted Permission sought to share information with : Biomedical engineer Information with NAME: Joseph Bowen  Permission granted to share info w AGENCY: Joseph Bowen        Emotional Assessment   Attitude/Demeanor/Rapport: Aggressive (Verbally and/or physically) Affect (typically observed): Agitated Orientation: : Oriented to Self Alcohol / Substance Use: Not Applicable Psych Involvement: No (comment)  Admission diagnosis:  Lactic acidosis [E87.2] Sepsis (HCC) [A41.9] Hypotension, unspecified hypotension type [I95.9] Urinary tract infection without hematuria, site unspecified [N39.0] Patient Active Problem List   Diagnosis Date Noted  . Sepsis (HCC) 02/02/2020  . AKI (acute kidney injury) (HCC) 02/02/2020  . UTI (urinary tract infection) 02/02/2020  . Lactic acidosis 02/02/2020  . Severe dehydration 02/02/2020  . 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 History of alcoholism (HCC) 02/09/2017  . Post traumatic stress disorder 02/09/2017  . Hydrocele 02/09/2017  . Impacted cerumen of right ear 02/09/2017  . Contusion of right upper back excluding scapular region 02/08/2017  . COPD (chronic obstructive pulmonary disease) (HCC) 01/18/2017  . BPH (benign prostatic hyperplasia) 01/18/2017  .  Chronic pancreatitis (HCC) 01/18/2017  . Essential hypertension 01/18/2017  . Glaucoma 01/18/2017  . Hypothyroidism 01/18/2017  . Solitary pulmonary nodule 01/18/2017  . Vitamin B12 deficiency 01/18/2017  .  Dementia with behavioral disturbance (HCC) 01/17/2017  . Slow transit constipation 09/04/2016  . Left cervical radiculopathy 07/26/2016  . Closed head injury 10/20/2015  . Fall 10/20/2015  . Heme + stool 07/27/2015  . 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 Memory loss 10/31/2014  . Dizziness 07/04/2014  . Syncope 07/04/2014  . Type 2 diabetes mellitus with hyperglycemia, with long-term current use of insulin (HCC) 07/04/2014  . Fracture, pelvis closed (HCC) 12/01/2013  . Proximal phalanx fracture of finger 12/01/2013  . Fx metacarpal neck-closed 12/01/2013  . Pelvis fracture (HCC) 11/10/2012  . Pelvic fracture (HCC) 09/23/2012  . Dislocation of PIP joint of finger 09/23/2012  . Dupuytren's disease of palm 09/02/2012   PCP:  Colon Branch, MD Pharmacy:   [facility] - [place], [place] - [address] [place] [address] Phone: [phone] Fax: [phone]     Social Determinants of Health (SDOH) Interventions    Readmission Risk Interventions No flowsheet data found.

## 2020-02-03 NOTE — NC FL2 (Signed)
[place] MEDICAID [id number] LEVEL OF CARE SCREENING TOOL     IDENTIFICATION  Patient Name: Kenric Ginger Birthdate: 09-23-1945 Sex: male Admission Date (Current Location): 02/01/2020  [place] and [place] Number:  Aaron Edelman [id number] Facility and Address:  [facility],  [address] [place] [address]      Provider Number: [id number]  Attending Physician Name and Address:  Cleora Fleet, MD  Relative Name and Phone Number:  [name] (Legal Guardian) [phone]    Current Level of Care: Hospital Recommended Level of Care: Skilled Nursing Facility Prior Approval Number:    Date Approved/Denied:   PASRR Number:    Discharge Plan: SNF    Current Diagnoses: Patient Active Problem List   Diagnosis Date Noted  . Sepsis (HCC) 02/02/2020  . AKI (acute kidney injury) (HCC) 02/02/2020  . UTI (urinary tract infection) 02/02/2020  . Lactic acidosis 02/02/2020  . Severe dehydration 02/02/2020  . 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 History of alcoholism (HCC) 02/09/2017  . Post traumatic stress disorder 02/09/2017  . Hydrocele 02/09/2017  . Impacted cerumen of right ear 02/09/2017  . Contusion of right upper back excluding scapular region 02/08/2017  . COPD (chronic obstructive pulmonary disease) (HCC) 01/18/2017  . BPH (benign prostatic hyperplasia) 01/18/2017  . Chronic pancreatitis (HCC) 01/18/2017  . Essential hypertension 01/18/2017  . Glaucoma 01/18/2017  . Hypothyroidism 01/18/2017  . Solitary pulmonary nodule 01/18/2017  . Vitamin B12 deficiency 01/18/2017  . Dementia with behavioral disturbance (HCC) 01/17/2017  . Slow transit constipation 09/04/2016  . Left cervical radiculopathy 07/26/2016  . Closed head injury 10/20/2015  . Fall 10/20/2015  . Heme + stool 07/27/2015  . 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 Memory loss 10/31/2014  . Dizziness 07/04/2014  . Syncope 07/04/2014  . Type 2 diabetes  mellitus with hyperglycemia, with long-term current use of insulin (HCC) 07/04/2014  . Fracture, pelvis closed (HCC) 12/01/2013  . Proximal phalanx fracture of finger 12/01/2013  . Fx metacarpal neck-closed 12/01/2013  . Pelvis fracture (HCC) 11/10/2012  . Pelvic fracture (HCC) 09/23/2012  . Dislocation of PIP joint of finger 09/23/2012  . Dupuytren's disease of palm 09/02/2012    Orientation RESPIRATION BLADDER Height & Weight     Self  Normal Continent Weight: 143 lb 4.8 oz (65 kg) Height:  5\' 8"  (172.7 cm)  BEHAVIORAL SYMPTOMS/MOOD NEUROLOGICAL BOWEL NUTRITION STATUS  Verbally abusive, Physically abusive   Continent Diet (DYS 3)  AMBULATORY STATUS COMMUNICATION OF NEEDS Skin   Independent (cane sometimes) Verbally Normal                       Personal Care Assistance Level of Assistance  Bathing, Dressing, Feeding Bathing Assistance: Limited assistance Feeding assistance: Independent Dressing Assistance: Limited assistance     Functional Limitations Info  Sight, Hearing, Speech Sight Info: Adequate Hearing Info: Adequate Speech Info: Adequate    SPECIAL CARE FACTORS FREQUENCY                       Contractures Contractures Info: Not present    Additional Factors Info  Code Status, Allergies, Psychotropic Code Status Info: Full Code Allergies Info: NKA Psychotropic Info: Ativan, Zoloft, Desyrel, Depakote, Zyprexa         Current Medications (02/03/2020):  This is the current hospital active medication list Current Facility-Administered Medications  Medication Dose Route Frequency Provider Last Rate Last Admin  . 0.9 %  sodium chloride infusion  Intravenous Continuous Laural Benes, Clanford L, MD 125 mL/hr at 02/02/20 1800 Rate Verify at 02/02/20 1800  . acetaminophen (TYLENOL) tablet 650 mg  650 mg Oral Q6H PRN Johnson, Clanford L, MD       Or  . acetaminophen (TYLENOL) suppository 650 mg  650 mg Rectal Q6H PRN Johnson, Clanford L, MD      .  atorvastatin (LIPITOR) tablet 40 mg  40 mg Oral QPM Johnson, Clanford L, MD   40 mg at 02/02/20 1654  . ceFEPIme (MAXIPIME) 2 g in sodium chloride 0.9 % 100 mL IVPB  2 g Intravenous Q8H Johnson, Clanford L, MD 200 mL/hr at 02/03/20 1303 2 g at 02/03/20 1303  . Chlorhexidine Gluconate Cloth 2 % PADS 6 each  6 each Topical Daily Laural Benes, Clanford L, MD   6 each at 02/03/20 1205  . dexmedetomidine (PRECEDEX) 400 MCG/100ML (4 mcg/mL) infusion  0.4-1.2 mcg/kg/hr Intravenous Titrated Marikay Alar, FNP   Stopped at 02/03/20 1230  . enoxaparin (LOVENOX) injection 40 mg  40 mg Subcutaneous Q24H Johnson, Clanford L, MD   40 mg at 02/03/20 0906  . fludrocortisone (FLORINEF) tablet 0.1 mg  0.1 mg Oral Daily Johnson, Clanford L, MD      . folic acid (FOLVITE) tablet 1 mg  1 mg Oral Daily Johnson, Clanford L, MD   1 mg at 02/02/20 1143  . gabapentin (NEURONTIN) capsule 300 mg  300 mg Oral TID Laural Benes, Clanford L, MD   300 mg at 02/02/20 2212  . haloperidol lactate (HALDOL) injection 5 mg  5 mg Intravenous Q6H PRN Johnson, Clanford L, MD   5 mg at 02/02/20 1818  . hydrALAZINE (APRESOLINE) tablet 50 mg  50 mg Oral Q8H Johnson, Clanford L, MD   50 mg at 02/02/20 2212  . insulin aspart (novoLOG) injection 0-5 Units  0-5 Units Subcutaneous QHS Johnson, Clanford L, MD      . insulin aspart (novoLOG) injection 0-9 Units  0-9 Units Subcutaneous TID WC Johnson, Clanford L, MD   3 Units at 02/02/20 1703  . insulin aspart (novoLOG) injection 3 Units  3 Units Subcutaneous TID WC Johnson, Clanford L, MD   3 Units at 02/02/20 1707  . insulin glargine (LANTUS) injection 10 Units  10 Units Subcutaneous Daily Laural Benes, Clanford L, MD   10 Units at 02/02/20 1738  . labetalol (NORMODYNE) injection 10 mg  10 mg Intravenous Q2H PRN Johnson, Clanford L, MD   10 mg at 02/02/20 1649  . [START ON 02/06/2020] levothyroxine (SYNTHROID, LEVOTHROID) injection 68.5 mcg  68.5 mcg Intravenous Daily Johnson, Clanford L, MD      .  lipase/protease/amylase (CREON) capsule 36,000 Units  36,000 Units Oral TID WC Laural Benes, Clanford L, MD   36,000 Units at 02/02/20 1654  . LORazepam (ATIVAN) tablet 0.5 mg  0.5 mg Oral Q4H PRN Laural Benes, Clanford L, MD   0.5 mg at 02/02/20 2212  . memantine (NAMENDA) tablet 5 mg  5 mg Oral BID Laural Benes, Clanford L, MD   5 mg at 02/02/20 2212  . multivitamin with minerals tablet 1 tablet  1 tablet Oral Daily Johnson, Clanford L, MD   1 tablet at 02/02/20 1145  . OLANZapine (ZYPREXA) tablet 15 mg  15 mg Oral QPM Johnson, Clanford L, MD   15 mg at 02/02/20 1654  . ondansetron (ZOFRAN) tablet 4 mg  4 mg Oral Q6H PRN Johnson, Clanford L, MD       Or  . ondansetron (ZOFRAN) injection 4 mg  4  mg Intravenous Q6H PRN Johnson, Clanford L, MD      . polyethylene glycol (MIRALAX / GLYCOLAX) packet 17 g  17 g Oral Daily PRN Johnson, Clanford L, MD      . senna-docusate (Senokot-S) tablet 1 tablet  1 tablet Oral BID Laural Benes, Clanford L, MD   1 tablet at 02/02/20 1703  . sertraline (ZOLOFT) tablet 50 mg  50 mg Oral Daily Johnson, Clanford L, MD   50 mg at 02/02/20 1703  . thiamine tablet 100 mg  100 mg Oral Daily Johnson, Clanford L, MD   100 mg at 02/02/20 1143  . traZODone (DESYREL) tablet 50 mg  50 mg Oral QHS PRN Laural Benes, Clanford L, MD   50 mg at 02/02/20 2212     Discharge Medications: Please see discharge summary for a list of discharge medications.  Relevant Imaging Results:  Relevant Lab Results:   Additional Information    Rayshawn Visconti, Juleen China, LCSW

## 2020-02-03 NOTE — Plan of Care (Signed)
Pt calm & cooperative. Soft restraints removed.
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
## 2020-02-03 NOTE — Progress Notes (Addendum)
PROGRESS NOTE   Joseph Bowen  QTM:[id number] DOB: Apr 28, 1946 DOA: 02/01/2020 PCP: Colon Branch, MD   Chief Complaint  Patient presents with  . Altered Mental Status    hypotensive    Brief Admission History:  74 y.o. male with longstanding dementia with behavioral disturbances, history of chronic alcoholism and history of Wernicke's encephalopathy from chronic alcohol abuse, chronic pancreatitis, BPH, glaucoma, hypertension, chronic constipation, COPD, GAD who is a longterm resident at [facility] was sent for changes in his baseline mentation, increasing combattiveness, poor oral intake with hypotension BP was noted at [facility] to be 83/57.   He was admitted with sepsis of unknown source, severe lactic acidosis and severe dehydration.   Assessment & Plan:   Principal Problem:   Sepsis (HCC) Active Problems:   Type 2 diabetes mellitus with hyperglycemia, with long-term current use of insulin (HCC)   Wernicke-Korsakoff syndrome (alcoholic) (HCC)   Memory loss   Closed head injury   Slow transit constipation   Dementia with behavioral disturbance (HCC)   History of alcoholism (HCC)   COPD (chronic obstructive pulmonary disease) (HCC)   BPH (benign prostatic hyperplasia)   Chronic pancreatitis (HCC)   Essential hypertension   Glaucoma   Hypothyroidism   Vitamin B12 deficiency   AKI (acute kidney injury) (HCC)   UTI (urinary tract infection)   Lactic acidosis   Severe dehydration   Agitation due to dementia (HCC)   Hypomagnesemia   Hypothermia   1. Sepsis ruled out -   Antibiotics discontinued.  Pt clinically much improved and back to reported baseline.  2. Lactic acidosis - RESOLVED. repeated lactic acid is 0.8.    3. UTI - Fully treated. Urine culture no significant growth.  4. Severe dehydration - IV fluids as ordered.  5. Dementia (severe) with behavioral disturbances - had severe agitation overnight requiring precedex infusion.  6. COPD - stable.  Follow clinically.  7. Essential hypertension - resume home meds and follow.  8. BPH - follow and cath as needed if unable to void.  9. History of chronic alcoholism and wernicke korsakoff syndrome - resume vitamin supplements.  10. Hypotension - responded well to IV fluid hydration.  11. Uncontrolled type 2 diabetes mellitus with hyperglycemia- see insulin orders and CBG monitoring.    12. Hypothermia - added warming blanket.   DVT prophylaxis: lovenox Code Status: full  Family Communication:  Disposition:   Status is: Inpatient  Remains inpatient appropriate because:IV treatments appropriate due to intensity of illness or inability to take PO and Inpatient level of care appropriate due to severity of illness  Dispo: The patient is from: SNF              Anticipated d/c is to: SNF              Anticipated d/c date is: 1 day              Patient currently is not medically stable to d/c.   Consultants:     Procedures:     Antimicrobials:  Cefepime 7/21>> Vancomycin 7/21   Subjective: Pt sedated on precedex infusion  Objective: Vitals:   02/03/20 1030 02/03/20 1105 02/03/20 1300 02/03/20 1400  BP: 125/71   (!) 157/73  Pulse: 49 50 47 49  Resp: (!) 11 (!) 11 (!) 9 (!) 10  Temp:  (!) 94.9 F (34.9 C)    TempSrc:  Rectal    SpO2: 93% 92% 96% 98%  Weight:  Height:        Intake/Output Summary (Last 24 hours) at 02/03/2020 1457 Last data filed at 02/03/2020 0543 Gross per 24 hour  Intake 1348.66 ml  Output 2625 ml  Net -1276.34 ml   Filed Weights   02/01/20 2351 02/02/20 1551 02/03/20 0400  Weight: 68 kg 65.3 kg 65 kg    Examination:  General exam: elderly frail chronically ill appearing male, sedated  Respiratory system: BBS no rales heard, Respiratory effort normal. Cardiovascular system: normal S1 & S2 heard, RRR. No JVD, murmurs, rubs, gallops or clicks. No pedal edema. Gastrointestinal system: Abdomen is nondistended, soft and nontender. No  organomegaly or masses felt. Normal bowel sounds heard. Central nervous system: Alert and oriented. No focal neurological deficits. Extremities: Symmetric 5 x 5 power. Skin: No rashes, lesions or ulcers Psychiatry: Judgement and insight appear normal. Mood & affect appropriate.   Data Reviewed: I have personally reviewed following labs and imaging studies  CBC: Recent Labs  Lab 02/02/20 0022 02/03/20 0505  WBC 7.4 4.9  NEUTROABS  --  3.2  HGB 11.2* 11.3*  HCT 35.9* 35.2*  MCV 93.7 90.5  PLT 186 176    Basic Metabolic Panel: Recent Labs  Lab 02/02/20 0022 02/03/20 0505  NA 140 144  K 4.0 3.6  CL 104 106  CO2 23 27  GLUCOSE 124* 115*  BUN 18 10  CREATININE 1.40* 0.69  CALCIUM 8.9 8.2*  MG  --  1.2*    GFR: Estimated Creatinine Clearance: 75.6 mL/min (by C-G formula based on SCr of 0.69 mg/dL).  Liver Function Tests: Recent Labs  Lab 02/02/20 0022 02/03/20 0505  AST 17 16  ALT 9 12  ALKPHOS 42 40  BILITOT 0.5 0.6  PROT 5.8* 5.8*  ALBUMIN 3.5 3.3*    CBG: Recent Labs  Lab 02/02/20 1411 02/02/20 1657 02/02/20 2236 02/03/20 0750 02/03/20 1156  GLUCAP 244* 212* 170* 97 85    Recent Results (from the past 240 hour(s))  SARS Coronavirus 2 by RT PCR (hospital order, performed in [facility] hospital lab) Nasopharyngeal Nasopharyngeal Swab     Status: None   Collection Time: 02/02/20 12:29 AM   Specimen: Nasopharyngeal Swab  Result Value Ref Range Status   SARS Coronavirus 2 NEGATIVE NEGATIVE Final    Comment: (NOTE) SARS-CoV-2 target nucleic acids are NOT DETECTED.  The SARS-CoV-2 RNA is generally detectable in upper and lower respiratory specimens during the acute phase of infection. The lowest concentration of SARS-CoV-2 viral copies this assay can detect is 250 copies / mL. A negative result does not preclude SARS-CoV-2 infection and should not be used as the sole basis for treatment or other patient management decisions.  A negative result may  occur with improper specimen collection / handling, submission of specimen other than nasopharyngeal swab, presence of viral mutation(s) within the areas targeted by this assay, and inadequate number of viral copies (<250 copies / mL). A negative result must be combined with clinical observations, patient history, and epidemiological information.  Fact Sheet for Patients:   BoilerBrush.com.cyhttps://www.fda.gov/media/136312/download  Fact Sheet for Healthcare Providers: https://pope.com/https://www.fda.gov/media/136313/download  This test is not yet approved or  cleared by the [place] FDA and has been authorized for detection and/or diagnosis of SARS-CoV-2 by FDA under an Emergency Use Authorization (EUA).  This EUA will remain in effect (meaning this test can be used) for the duration of the COVID-19 declaration under Section 564(b)(1) of the Act, 21 U.S.C. section 360bbb-3(b)(1), unless the authorization is terminated or  revoked sooner.  Performed at [facility], [address] [place], [place] [address]   Blood Culture (routine x 2)     Status: None (Preliminary result)   Collection Time: 02/02/20 12:42 AM   Specimen: BLOOD  Result Value Ref Range Status   Specimen Description BLOOD LEFT ANTECUBITAL  Final   Special Requests   Final    BOTTLES DRAWN AEROBIC AND ANAEROBIC Blood Culture results may not be optimal due to an excessive volume of blood received in culture bottles   Culture   Final    NO GROWTH 1 DAY Performed at [facility], [address] [place], [place] [address]    Report Status PENDING  Incomplete  Blood Culture (routine x 2)     Status: None (Preliminary result)   Collection Time: 02/02/20 12:52 AM   Specimen: BLOOD  Result Value Ref Range Status   Specimen Description BLOOD RIGHT ANTECUBITAL  Final   Special Requests   Final    BOTTLES DRAWN AEROBIC AND ANAEROBIC Blood Culture adequate volume   Culture   Final    NO GROWTH 1 DAY Performed at [facility], [address]  [place], [place] [address]    Report Status PENDING  Incomplete  Urine culture     Status: None (Preliminary result)   Collection Time: 02/02/20  2:00 AM   Specimen: In/Out Cath Urine  Result Value Ref Range Status   Specimen Description   Final    IN/OUT CATH URINE Performed at [facility], [address], [place], [place] [address]    Special Requests   Final    NONE Performed at [facility], [address] [place], [place] [address]    Culture   Final    CULTURE REINCUBATED FOR BETTER GROWTH Performed at [facility], [address], [place], [place] [address]    Report Status PENDING  Incomplete  MRSA PCR Screening     Status: None   Collection Time: 02/02/20 11:52 AM   Specimen: Nasal Mucosa; Nasopharyngeal  Result Value Ref Range Status   MRSA by PCR NEGATIVE NEGATIVE Final    Comment:        The GeneXpert MRSA Assay (FDA approved for NASAL specimens only), is one component of a comprehensive MRSA colonization surveillance program. It is not intended to diagnose MRSA infection nor to guide or monitor treatment for MRSA infections. Performed at [facility], [address], [place], [place] [address]      Radiology Studies: CT HEAD WO CONTRAST  Result Date: 02/02/2020 CLINICAL DATA:  Altered mental status and hypertension. Longstanding dementia. EXAM: CT HEAD WITHOUT CONTRAST TECHNIQUE: Contiguous axial images were obtained from the base of the skull through the vertex without intravenous contrast. COMPARISON:  01/22/2015 FINDINGS: Brain: There is central and cortical atrophy. Periventricular white matter changes are consistent with small vessel disease. There is longstanding ventriculomegaly, stable. There is no intra or extra-axial fluid collection or mass lesion. The basilar cisterns and ventricles have a normal appearance. There is no CT evidence for acute infarction or hemorrhage. Vascular: There is atherosclerotic calcification of the internal carotid arteries. No  hyperdense vessels. Skull: Normal. Negative for fracture or focal lesion. Sinuses/Orbits: No acute finding. Other: None IMPRESSION: 1. No evidence for acute intracranial abnormality. 2. Atrophy and small vessel disease. 3. Long-term stability of ventriculomegaly. Electronically Signed   By: Norva Pavlov M.D.   On: 02/02/2020 12:24   DG CHEST PORT 1 VIEW  Result Date: 02/03/2020 CLINICAL DATA:  Altered mental status. EXAM: PORTABLE CHEST 1 VIEW COMPARISON:  Chest x-ray 02/02/2020, 01/22/2015. CT abdomen 02/02/2020, 07/29/2017. PET-CT 12/12/2016. FINDINGS: Increased prominence of the superior mediastinum noted. This may be related to portable AP supine technique. Follow-up PA and lateral chest x-ray suggested to further evaluate. Heart size normal. Prominent air collection noted over left medial lung base. Similar findings noted on prior CT of 02/02/2020. This is consistent with a prominent pneumatocele. Low lung volumes with bibasilar atelectasis. Small bilateral pleural effusions. No pneumothorax. Heart size stable. No acute bony abnormality identified. IMPRESSION: 1. Increased prominence of the superior mediastinum noted. This may be related to portable AP supine technique. Follow-up PA and lateral chest x-ray suggested to further evaluate. 2. Prominent pneumatocele left lung base. Low lung volumes with bibasilar atelectasis. Small bilateral pleural effusions. These results will be called to the ordering clinician or representative by the Radiologist Assistant, and communication documented in the PACS or [employer]. Electronically Signed   By: Maisie Fus  Register   On: 02/03/2020 06:16   DG Chest Port 1 View  Result Date: 02/02/2020 CLINICAL DATA:  Hypotension EXAM: PORTABLE CHEST 1 VIEW COMPARISON:  January 22, 2015 FINDINGS: The heart size and mediastinal contours are within normal limits. There is slight hyperinflation of the upper lung zones. The visualized skeletal structures are unremarkable.  IMPRESSION: No active disease. Electronically Signed   By: Jonna Clark M.D.   On: 02/02/2020 01:04   CT Angio Abd/Pel w/ and/or w/o  Result Date: 02/02/2020 CLINICAL DATA:  Delirium.  Elevated lactic acid. EXAM: CTA ABDOMEN AND PELVIS WITHOUT AND WITH CONTRAST TECHNIQUE: Multidetector CT imaging of the abdomen and pelvis was performed using the standard protocol during bolus administration of intravenous contrast. Multiplanar reconstructed images and MIPs were obtained and reviewed to evaluate the vascular anatomy. CONTRAST:  OMNIPAQUE IOHEXOL 350 MG/ML SOLN COMPARISON:  CT abdomen and pelvis 07/29/2017 FINDINGS: VASCULAR Aorta: Diffuse atherosclerosis without evidence of a dissection, aneurysm, or significant stenosis. Celiac: Previous embolization with streak artifact limiting assessment. Gross patency of the splenic and hepatic arteries. SMA: Patent without evidence of aneurysm, dissection, vasculitis or significant stenosis. Renals: Both renal arteries are patent without evidence of aneurysm, dissection, vasculitis, fibromuscular dysplasia or significant stenosis. IMA: Patent with moderate to severe origin stenosis. Inflow: Patent with moderately extensive atherosclerosis. Mild-to-moderate right common iliac artery stenosis. Proximal Outflow: Patent with mild atherosclerosis. No evidence of significant stenosis or dissection. Veins: No gross abnormality. Review of the MIP images confirms the above findings. NON-VASCULAR Lower chest: Paraseptal emphysema.  Trace right pleural effusion. Hepatobiliary: No focal liver abnormality is seen. The gallbladder is collapsed with a punctate stone in the region of the gallbladder neck. There is no biliary dilatation. Pancreas: Limited assessment due to streak artifact from embolization coils. Chronic pancreatic atrophy and calcifications suggesting chronic pancreatitis with similar hypodensity in the region of the pancreatic head and uncinate process. Spleen:  Unremarkable. Adrenals/Urinary Tract: Unremarkable adrenal glands. Punctate bilateral renal calculi. No hydronephrosis. 1.3 cm left renal cyst. Unremarkable bladder. Stomach/Bowel: The stomach is grossly unremarkable. There is a moderate amount of stool in the colon. There is no evidence of bowel obstruction. Diverticulosis is again noted with chronic wall thickening in the proximal sigmoid colon with luminal narrowing also described on a 2017 colonoscopy. The appendix is unremarkable. Lymphatic: No enlarged lymph nodes. Reproductive: Similar prosthetic enlargement which indents the bladder base. Other: No intraperitoneal free fluid or free air. Musculoskeletal: Remote bilateral superior and inferior pubic rami fractures. Chronic mild L1  compression fracture. Focally advanced disc degeneration at L5-S1. IMPRESSION: 1. No evidence of acute mesenteric ischemia or other acute abnormality in the abdomen or pelvis. 2. 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 3. Colonic diverticulosis with similar appearance of chronic sigmoid colon wall thickening. 4. Chronic pancreatitis. 5. Cholelithiasis. 6. Nonobstructing bilateral nephrolithiasis. 7. Trace right pleural effusion. 8. Aortic Atherosclerosis (ICD10-I70.0) and Emphysema (ICD10-J43.9). Electronically Signed   By: Sebastian Ache M.D.   On: 02/02/2020 07:17   Scheduled Meds: . atorvastatin  40 mg Oral QPM  . Chlorhexidine Gluconate Cloth  6 each Topical Daily  . enoxaparin (LOVENOX) injection  40 mg Subcutaneous Q24H  . fludrocortisone  0.1 mg Oral Daily  . folic acid  1 mg Oral Daily  . gabapentin  300 mg Oral TID  . hydrALAZINE  50 mg Oral Q8H  . insulin aspart  0-5 Units Subcutaneous QHS  . insulin aspart  0-9 Units Subcutaneous TID WC  . insulin aspart  3 Units Subcutaneous TID WC  . insulin glargine  10 Units Subcutaneous Daily  . [START ON 02/06/2020] levothyroxine  68.5 mcg Intravenous Daily  . lipase/protease/amylase   36,000 Units Oral TID WC  . memantine  5 mg Oral BID  . multivitamin with minerals  1 tablet Oral Daily  . OLANZapine  15 mg Oral QPM  . senna-docusate  1 tablet Oral BID  . sertraline  50 mg Oral Daily  . thiamine  100 mg Oral Daily   Continuous Infusions: . sodium chloride 125 mL/hr at 02/02/20 1800  . ceFEPime (MAXIPIME) IV 2 g (02/03/20 1303)  . dexmedetomidine (PRECEDEX) IV infusion Stopped (02/03/20 1230)     LOS: 1 day   Critical Care Procedure Note Authorized and Performed by: Maryln Manuel MD  Total Critical Care time:  34 mins Due to a high probability of clinically significant, life threatening deterioration, the patient required my highest level of preparedness to intervene emergently and I personally spent this critical care time directly and personally managing the patient.  This critical care time included obtaining a history; examining the patient, pulse oximetry; ordering and review of studies; arranging urgent treatment with development of a management plan; evaluation of patient's response of treatment; frequent reassessment; and discussions with other providers.  This critical care time was performed to assess and manage the high probability of imminent and life threatening deterioration that could result in multi-organ failure.  It was exclusive of separately billable procedures and treating other patients and teaching time.   Standley Dakins, MD How to contact the [facility] Attending or Consulting provider 7A - 7P or covering provider during after hours 7P -7A, for this patient?  1. Check the care team in [facility] and look for a) attending/consulting [facility] provider listed and b) the [facility] team listed 2. Log into [email or web address] and use [facility] universal password to access. If you do not have the password, please contact the hospital operator. 3. Locate the [facility] provider you are looking for under [facility] and page to a number that you can be directly reached. 4. If you  still have difficulty reaching the provider, please page the [facility] (Director on Call) for the Hospitalists listed on amion for assistance.  02/03/2020, 2:57 PM

## 2020-02-03 NOTE — Progress Notes (Signed)
Pt beginning to have tremors and getting progressively confused. Pt is agitated and wanting to leave. Constantly pulling at equipment and wires. Precedex gtt restarted at lowest dose.

## 2020-02-03 NOTE — Progress Notes (Signed)
result on pt chest xray called to this RN. Hospitalist paged and notified via text page of result.

## 2020-02-04 LAB — MAGNESIUM: Magnesium: 1.7 mg/dL (ref 1.7–2.4)

## 2020-02-04 LAB — GLUCOSE, CAPILLARY
Glucose-Capillary: 195 mg/dL — ABNORMAL HIGH (ref 70–99)
Glucose-Capillary: 186 mg/dL — ABNORMAL HIGH (ref 70–99)
Glucose-Capillary: 83 mg/dL (ref 70–99)
Glucose-Capillary: 143 mg/dL — ABNORMAL HIGH (ref 70–99)
Glucose-Capillary: 212 mg/dL — ABNORMAL HIGH (ref 70–99)
Glucose-Capillary: 232 mg/dL — ABNORMAL HIGH (ref 70–99)

## 2020-02-04 LAB — COMPREHENSIVE METABOLIC PANEL
ALT: 12 U/L (ref 0–44)
AST: 17 U/L (ref 15–41)
Albumin: 2.9 g/dL — ABNORMAL LOW (ref 3.5–5.0)
Alkaline Phosphatase: 40 U/L (ref 38–126)
Anion gap: 7 (ref 5–15)
BUN: 11 mg/dL (ref 8–23)
CO2: 22 mmol/L (ref 22–32)
Calcium: 7.5 mg/dL — ABNORMAL LOW (ref 8.9–10.3)
Chloride: 111 mmol/L (ref 98–111)
Creatinine, Ser: 0.72 mg/dL (ref 0.61–1.24)
GFR calc Af Amer: >60 mL/min (ref >60)
GFR calc non Af Amer: >60 mL/min (ref >60)
Glucose, Bld: 152 mg/dL — ABNORMAL HIGH (ref 70–99)
Potassium: 4 mmol/L (ref 3.5–5.1)
Sodium: 140 mmol/L (ref 135–145)
Total Bilirubin: 0.4 mg/dL (ref 0.3–1.2)
Total Protein: 5.1 g/dL — ABNORMAL LOW (ref 6.5–8.1)

## 2020-02-04 LAB — URINE CULTURE

## 2020-02-04 LAB — BRAIN NATRIURETIC PEPTIDE: B Natriuretic Peptide: 222 pg/mL — ABNORMAL HIGH (ref 0.0–100.0)

## 2020-02-04 LAB — CBC WITH DIFFERENTIAL/PLATELET
Abs Immature Granulocytes: 0.01 10*3/uL (ref 0.00–0.07)
Basophils Absolute: 0 10*3/uL (ref 0.0–0.1)
Basophils Relative: 0 %
Eosinophils Absolute: 0.2 10*3/uL (ref 0.0–0.5)
Eosinophils Relative: 4 %
HCT: 34.3 % — ABNORMAL LOW (ref 39.0–52.0)
Hemoglobin: 10.9 g/dL — ABNORMAL LOW (ref 13.0–17.0)
Immature Granulocytes: 0 %
Lymphocytes Relative: 26 %
Lymphs Abs: 1.1 10*3/uL (ref 0.7–4.0)
MCH: 29.5 pg (ref 26.0–34.0)
MCHC: 31.8 g/dL (ref 30.0–36.0)
MCV: 93 fL (ref 80.0–100.0)
Monocytes Absolute: 0.4 10*3/uL (ref 0.1–1.0)
Monocytes Relative: 9 %
Neutro Abs: 2.5 10*3/uL (ref 1.7–7.7)
Neutrophils Relative %: 61 %
Platelets: 170 10*3/uL (ref 150–400)
RBC: 3.69 MIL/uL — ABNORMAL LOW (ref 4.22–5.81)
RDW: 14.6 % (ref 11.5–15.5)
WBC: 4.2 10*3/uL (ref 4.0–10.5)
nRBC: 0 % (ref 0.0–0.2)

## 2020-02-04 LAB — PROCALCITONIN: Procalcitonin: <0.1 ng/mL

## 2020-02-04 MED ORDER — HYDRALAZINE HCL 25 MG PO TABS: 25.0000 mg | ORAL_TABLET | Freq: Three times a day (TID) | ORAL | Status: DC

## 2020-02-04 MED ORDER — LORAZEPAM 0.5 MG PO TABS: 0.5000 mg | ORAL_TABLET | Freq: Two times a day (BID) | ORAL | 0 refills | Status: AC

## 2020-02-04 MED ORDER — INSULIN GLARGINE 100 UNIT/ML ~~LOC~~ SOLN: 12.0000 [IU] | Freq: Every day | SUBCUTANEOUS | 11 refills | Status: DC

## 2020-02-04 MED ORDER — SODIUM CHLORIDE 0.9 % IV SOLN: INTRAVENOUS | Status: DC

## 2020-02-04 MED ORDER — LORAZEPAM 0.5 MG PO TABS: 0.5000 mg | ORAL_TABLET | Freq: Two times a day (BID) | ORAL | 0 refills | Status: DC

## 2020-02-04 MED ORDER — SODIUM CHLORIDE 0.9 % IV BOLUS
1000.0000 mL | Freq: Once | INTRAVENOUS | Status: AC
  Administered 2020-02-04: 1000 mL via INTRAVENOUS

## 2020-02-04 NOTE — Progress Notes (Signed)
Nsg Discharge Note  Admit Date:  02/01/2020 Discharge date: 02/04/2020   [facility] to be D/C'd Skilled nursing facility per MD order.  AVS completed.  Copy for chart, and copy for patient signed, and dated. Patient/caregiver able to verbalize understanding.  Discharge Medication: Allergies as of 02/04/2020   No Known Allergies     Medication List    STOP taking these medications   bisacodyl 10 MG suppository Commonly known as: DULCOLAX   cyanocobalamin 1000 MCG tablet   divalproex 125 MG capsule Commonly known as: DEPAKOTE SPRINKLE   docusate sodium 100 MG capsule Commonly known as: COLACE   ergocalciferol 1.25 MG (50000 UT) capsule Commonly known as: VITAMIN D2   guaifenesin 100 MG/5ML syrup Commonly known as: ROBITUSSIN   ibuprofen 200 MG tablet Commonly known as: ADVIL   Nuedexta 20-10 MG capsule Generic drug: Dextromethorphan-quiNIDine   prazosin 2 MG capsule Commonly known as: MINIPRESS   Systane Balance 0.6 % Soln Generic drug: Propylene Glycol     TAKE these medications   atorvastatin 40 MG tablet Commonly known as: LIPITOR Take 40 mg by mouth daily.   fludrocortisone 0.1 MG tablet Commonly known as: FLORINEF Take 1 tablet (0.1 mg total) by mouth daily.   gabapentin 400 MG capsule Commonly known as: NEURONTIN Take 400 mg by mouth 3 (three) times daily.   HumaLOG 100 UNIT/ML injection Generic drug: insulin lispro Inject 2-12 Units into the skin 4 (four) times daily. 151-200 2units,201-250 4units,251-300 6units, 301-350 8units,351-400 10units,401-450 12units notify MD if greader than 400   hydrALAZINE 25 MG tablet Commonly known as: APRESOLINE Take 1 tablet (25 mg total) by mouth 3 (three) times daily. HOLD FOR SBP<130   insulin glargine 100 UNIT/ML injection Commonly known as: LANTUS Inject 0.12 mLs (12 Units total) into the skin daily. What changed: how much to take   Levothyroxine Sodium 137 MCG Caps Take 137 mcg by mouth daily  before breakfast.   lipase/protease/amylase 61443 UNITS Cpep capsule Commonly known as: CREON Take 1 capsule (36,000 Units total) by mouth 3 (three) times daily with meals.   LORazepam 0.5 MG tablet Commonly known as: ATIVAN Take 1 tablet (0.5 mg total) by mouth 2 (two) times daily.   melatonin 1 MG Tabs tablet Take 1 mg by mouth at bedtime. What changed: Another medication with the same name was removed. Continue taking this medication, and follow the directions you see here.   memantine 5 MG tablet Commonly known as: NAMENDA Take 5 mg by mouth 2 (two) times daily.   metFORMIN 1000 MG tablet Commonly known as: GLUCOPHAGE Take 1,000 mg by mouth 2 (two) times daily with a meal.   OLANZapine 5 MG tablet Commonly known as: ZYPREXA Take 15 mg by mouth every evening. What changed: Another medication with the same name was removed. Continue taking this medication, and follow the directions you see here.   sennosides-docusate sodium 8.6-50 MG tablet Commonly known as: SENOKOT-S Take 1 tablet by mouth 2 (two) times daily.   sertraline 50 MG tablet Commonly known as: ZOLOFT Take 50 mg by mouth daily.   sitaGLIPtin 100 MG tablet Commonly known as: JANUVIA Take 100 mg by mouth daily.   thiamine 100 MG tablet Take 100 mg by mouth daily.   traZODone 50 MG tablet Commonly known as: DESYREL Take 50 mg by mouth at bedtime.       Discharge Assessment: Vitals:   02/04/20 1600 02/04/20 1654  BP: (!) 153/78   Pulse: 87  Resp: 16 19  Temp:    SpO2: 95%    Skin clean, dry and intact without evidence of skin break down, no evidence of skin tears noted. IV catheter discontinued intact. Site without signs and symptoms of complications - no redness or edema noted at insertion site, patient denies c/o pain - only slight tenderness at site.  Dressing with slight pressure applied.  D/c Instructions-Education: Discharge instructions given to patient/family with verbalized  understanding. D/c education completed with patient/family including follow up instructions, medication list, d/c activities limitations if indicated, with other d/c instructions as indicated by MD - patient able to verbalize understanding, all questions fully answered. Patient instructed to return to ED, call 911, or call MD for any changes in condition.  Patient escorted via stretcher, and D/C to SNF via ambulance.  Diego Cory, RN 02/04/2020 6:02 PM

## 2020-02-04 NOTE — Progress Notes (Addendum)
Pt resting quietly in bed, has been in no distress this morning. Precedex turned off as RN feels it is not needed. BP running on the soft side, will make MD aware. Restraints have been off 24 hours as of 0800 this morning from looking at previous nursing documentation. Will continue to monitor.

## 2020-02-04 NOTE — Discharge Summary (Signed)
Physician Discharge Summary  Ronnel Zuercher SLH:[id number] DOB: 1946/07/08 DOA: 02/01/2020  PCP: Colon Branch, MD  Admit date: 02/01/2020 Discharge date: 02/04/2020  Admitted From:  SNF  [name]  Disposition:   SNF [name]  Recommendations for Outpatient Follow-up:  1. Encourage free water intake to try to avoid dehydration 2. Pleaase check BMP/CBC in 1 week to follow lytes, renal function, hemoglobin 3. Please titrate BP meds as needed for better blood pressure control 4. Please observe patient directly taking his medications 5. Please monitor and record fluid intake daily.    Discharge Condition: STABLE   CODE STATUS: FULL    Brief Hospitalization Summary: Please see all hospital notes, images, labs for full details of the hospitalization. ADMISSION HPI: Joseph Bowen is a 74 y.o. 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    He was only able to follow some simple commands on arrival and was hypotensive but his BP seemed to respond favorably to IV fluid bolus.  His labs were abnormal with findings of AKI and marked lactic acidosis with initial lactic acid of 5.7 and after fluid boluses lactic acid increased to 7.2.  He was sent for CTA abdomen and pelvis to rule out ischemic bowel disease but the findings were negative.  His urinalysis was positive for RBC>50 and WBC 6-10.  His WBC was 7.4 and his creatinine was 1.40.  His CXR had no findings of active disease.  He became severe agitated in ED requiring sedation with IV ativan and restraints for safety as he had been striking out  at staff members.  Sars 2 coronavirus test was negative.  Blood and urine cultures were taken and patient was started on broad spectrum antibiotic coverage and admission was requested.    1.  Sepsis ruled out -   Antibiotics discontinued.  Pt clinically much improved and back to reported baseline.  2. Lactic acidosis - RESOLVED. repeated lactic acid is 0.8.   3. UTI - Fully treated. Urine culture no significant growth.  4. Severe dehydration - treated with IV fluids.  5. Dementia (severe) with behavioral disturbances - responded very well to precedex. He has been off precedex now and has remained stable and no behavioral disturbances seen.  He was reassessed by MD prior to discharge to SNF and very stable.   6. COPD - stable. Follow clinically.  7. Essential hypertension - hydralazine 25 mg TID with holding parameters added for better BP control.   8. BPH - follow and cath as needed if unable to void.  9. History of chronic alcoholism and wernicke korsakoff syndrome - resume vitamin supplements.  10. Hypotension - responded well to IV fluid hydration. 11. Uncontrolled type 2 diabetes mellituswith hyperglycemia- resume home treatment plan, increased lantus to 12 units.   12. Hypothermia - RESOLVED.   DVT prophylaxis: lovenox Code Status: full  Family Communication: legal guardian updated telephone / [employer] nurse practitioner Alphonzo Lemmings updated who cares for him at [facility] Disposition:  SNF   Discharge Diagnoses:  Active Problems:   Type 2 diabetes mellitus with hyperglycemia, with long-term current use of insulin (HCC)   Wernicke-Korsakoff syndrome (alcoholic) (HCC)   Memory loss  Closed head injury   Slow transit constipation   Dementia with behavioral disturbance (HCC)   History of alcoholism (HCC)   COPD (chronic obstructive pulmonary disease) (HCC)   BPH (benign prostatic hyperplasia)   Chronic pancreatitis (HCC)   Essential hypertension   Glaucoma   Hypothyroidism    Vitamin B12 deficiency   AKI (acute kidney injury) (HCC)   UTI (urinary tract infection)   Lactic acidosis   Severe dehydration   Agitation due to dementia (HCC)   Hypomagnesemia   Hypothermia   Discharge Instructions:  Allergies as of 02/04/2020   No Known Allergies     Medication List    STOP taking these medications   bisacodyl 10 MG suppository Commonly known as: DULCOLAX   cyanocobalamin 1000 MCG tablet   divalproex 125 MG capsule Commonly known as: DEPAKOTE SPRINKLE   docusate sodium 100 MG capsule Commonly known as: COLACE   ergocalciferol 1.25 MG (50000 UT) capsule Commonly known as: VITAMIN D2   guaifenesin 100 MG/5ML syrup Commonly known as: ROBITUSSIN   ibuprofen 200 MG tablet Commonly known as: ADVIL   Nuedexta 20-10 MG capsule Generic drug: Dextromethorphan-quiNIDine   prazosin 2 MG capsule Commonly known as: MINIPRESS   Systane Balance 0.6 % Soln Generic drug: Propylene Glycol     TAKE these medications   atorvastatin 40 MG tablet Commonly known as: LIPITOR Take 40 mg by mouth daily.   fludrocortisone 0.1 MG tablet Commonly known as: FLORINEF Take 1 tablet (0.1 mg total) by mouth daily.   gabapentin 400 MG capsule Commonly known as: NEURONTIN Take 400 mg by mouth 3 (three) times daily.   HumaLOG 100 UNIT/ML injection Generic drug: insulin lispro Inject 2-12 Units into the skin 4 (four) times daily. 151-200 2units,201-250 4units,251-300 6units, 301-350 8units,351-400 10units,401-450 12units notify MD if greader than 400   hydrALAZINE 25 MG tablet Commonly known as: APRESOLINE Take 1 tablet (25 mg total) by mouth 3 (three) times daily. HOLD FOR SBP<130   insulin glargine 100 UNIT/ML injection Commonly known as: LANTUS Inject 0.12 mLs (12 Units total) into the skin daily. What changed: how much to take   Levothyroxine Sodium 137 MCG Caps Take 137 mcg by mouth daily before breakfast.   lipase/protease/amylase 53664 UNITS Cpep  capsule Commonly known as: CREON Take 1 capsule (36,000 Units total) by mouth 3 (three) times daily with meals.   LORazepam 0.5 MG tablet Commonly known as: ATIVAN Take 1 tablet (0.5 mg total) by mouth 2 (two) times daily.   melatonin 1 MG Tabs tablet Take 1 mg by mouth at bedtime. What changed: Another medication with the same name was removed. Continue taking this medication, and follow the directions you see here.   memantine 5 MG tablet Commonly known as: NAMENDA Take 5 mg by mouth 2 (two) times daily.   metFORMIN 1000 MG tablet Commonly known as: GLUCOPHAGE Take 1,000 mg by mouth 2 (two) times daily with a meal.   OLANZapine 5 MG tablet Commonly known as: ZYPREXA Take 15 mg by mouth every evening. What changed: Another medication with the same name was removed. Continue taking this medication, and follow the directions you see here.   sennosides-docusate sodium 8.6-50 MG tablet Commonly known as: SENOKOT-S Take 1 tablet by mouth 2 (two) times daily.   sertraline 50 MG tablet Commonly known as: ZOLOFT Take 50 mg by mouth daily.   sitaGLIPtin 100 MG tablet Commonly known as: JANUVIA Take 100 mg by mouth daily.   thiamine 100 MG tablet  Take 100 mg by mouth daily.   traZODone 50 MG tablet Commonly known as: DESYREL Take 50 mg by mouth at bedtime.       Contact information for after-discharge care    Destination    [name] .   Service: Skilled Nursing Contact information: [address] [place] [address] [phone]                 No Known Allergies Allergies as of 02/04/2020   No Known Allergies     Medication List    STOP taking these medications   bisacodyl 10 MG suppository Commonly known as: DULCOLAX   cyanocobalamin 1000 MCG tablet   divalproex 125 MG capsule Commonly known as: DEPAKOTE SPRINKLE   docusate sodium 100 MG capsule Commonly known as: COLACE   ergocalciferol 1.25 MG (50000 UT)  capsule Commonly known as: VITAMIN D2   guaifenesin 100 MG/5ML syrup Commonly known as: ROBITUSSIN   ibuprofen 200 MG tablet Commonly known as: ADVIL   Nuedexta 20-10 MG capsule Generic drug: Dextromethorphan-quiNIDine   prazosin 2 MG capsule Commonly known as: MINIPRESS   Systane Balance 0.6 % Soln Generic drug: Propylene Glycol     TAKE these medications   atorvastatin 40 MG tablet Commonly known as: LIPITOR Take 40 mg by mouth daily.   fludrocortisone 0.1 MG tablet Commonly known as: FLORINEF Take 1 tablet (0.1 mg total) by mouth daily.   gabapentin 400 MG capsule Commonly known as: NEURONTIN Take 400 mg by mouth 3 (three) times daily.   HumaLOG 100 UNIT/ML injection Generic drug: insulin lispro Inject 2-12 Units into the skin 4 (four) times daily. 151-200 2units,201-250 4units,251-300 6units, 301-350 8units,351-400 10units,401-450 12units notify MD if greader than 400   hydrALAZINE 25 MG tablet Commonly known as: APRESOLINE Take 1 tablet (25 mg total) by mouth 3 (three) times daily. HOLD FOR SBP<130   insulin glargine 100 UNIT/ML injection Commonly known as: LANTUS Inject 0.12 mLs (12 Units total) into the skin daily. What changed: how much to take   Levothyroxine Sodium 137 MCG Caps Take 137 mcg by mouth daily before breakfast.   lipase/protease/amylase 82956 UNITS Cpep capsule Commonly known as: CREON Take 1 capsule (36,000 Units total) by mouth 3 (three) times daily with meals.   LORazepam 0.5 MG tablet Commonly known as: ATIVAN Take 1 tablet (0.5 mg total) by mouth 2 (two) times daily.   melatonin 1 MG Tabs tablet Take 1 mg by mouth at bedtime. What changed: Another medication with the same name was removed. Continue taking this medication, and follow the directions you see here.   memantine 5 MG tablet Commonly known as: NAMENDA Take 5 mg by mouth 2 (two) times daily.   metFORMIN 1000 MG tablet Commonly known as: GLUCOPHAGE Take 1,000 mg by  mouth 2 (two) times daily with a meal.   OLANZapine 5 MG tablet Commonly known as: ZYPREXA Take 15 mg by mouth every evening. What changed: Another medication with the same name was removed. Continue taking this medication, and follow the directions you see here.   sennosides-docusate sodium 8.6-50 MG tablet Commonly known as: SENOKOT-S Take 1 tablet by mouth 2 (two) times daily.   sertraline 50 MG tablet Commonly known as: ZOLOFT Take 50 mg by mouth daily.   sitaGLIPtin 100 MG tablet Commonly known as: JANUVIA Take 100 mg by mouth daily.   thiamine 100 MG tablet Take 100 mg by mouth daily.   traZODone 50 MG tablet Commonly known  as: DESYREL Take 50 mg by mouth at bedtime.       Procedures/Studies: CT HEAD WO CONTRAST  Result Date: 02/02/2020 CLINICAL DATA:  Altered mental status and hypertension. Longstanding dementia. EXAM: CT HEAD WITHOUT CONTRAST TECHNIQUE: Contiguous axial images were obtained from the base of the skull through the vertex without intravenous contrast. COMPARISON:  01/22/2015 FINDINGS: Brain: There is central and cortical atrophy. Periventricular white matter changes are consistent with small vessel disease. There is longstanding ventriculomegaly, stable. There is no intra or extra-axial fluid collection or mass lesion. The basilar cisterns and ventricles have a normal appearance. There is no CT evidence for acute infarction or hemorrhage. Vascular: There is atherosclerotic calcification of the internal carotid arteries. No hyperdense vessels. Skull: Normal. Negative for fracture or focal lesion. Sinuses/Orbits: No acute finding. Other: None IMPRESSION: 1. No evidence for acute intracranial abnormality. 2. Atrophy and small vessel disease. 3. Long-term stability of ventriculomegaly. Electronically Signed   By: Norva Pavlov M.D.   On: 02/02/2020 12:24   DG CHEST PORT 1 VIEW  Result Date: 02/03/2020 CLINICAL DATA:  Altered mental status. EXAM: PORTABLE CHEST  1 VIEW COMPARISON:  Chest x-ray 02/02/2020, 01/22/2015. CT abdomen 02/02/2020, 07/29/2017. PET-CT 12/12/2016. FINDINGS: Increased prominence of the superior mediastinum noted. This may be related to portable AP supine technique. Follow-up PA and lateral chest x-ray suggested to further evaluate. Heart size normal. Prominent air collection noted over left medial lung base. Similar findings noted on prior CT of 02/02/2020. This is consistent with a prominent pneumatocele. Low lung volumes with bibasilar atelectasis. Small bilateral pleural effusions. No pneumothorax. Heart size stable. No acute bony abnormality identified. IMPRESSION: 1. Increased prominence of the superior mediastinum noted. This may be related to portable AP supine technique. Follow-up PA and lateral chest x-ray suggested to further evaluate. 2. Prominent pneumatocele left lung base. Low lung volumes with bibasilar atelectasis. Small bilateral pleural effusions. These results will be called to the ordering clinician or representative by the Radiologist Assistant, and communication documented in the PACS or [employer]. Electronically Signed   By: Maisie Fus  Register   On: 02/03/2020 06:16   DG Chest Port 1 View  Result Date: 02/02/2020 CLINICAL DATA:  Hypotension EXAM: PORTABLE CHEST 1 VIEW COMPARISON:  January 22, 2015 FINDINGS: The heart size and mediastinal contours are within normal limits. There is slight hyperinflation of the upper lung zones. The visualized skeletal structures are unremarkable. IMPRESSION: No active disease. Electronically Signed   By: Jonna Clark M.D.   On: 02/02/2020 01:04   CT Angio Abd/Pel w/ and/or w/o  Result Date: 02/02/2020 CLINICAL DATA:  Delirium.  Elevated lactic acid. EXAM: CTA ABDOMEN AND PELVIS WITHOUT AND WITH CONTRAST TECHNIQUE: Multidetector CT imaging of the abdomen and pelvis was performed using the standard protocol during bolus administration of intravenous contrast. Multiplanar reconstructed  images and MIPs were obtained and reviewed to evaluate the vascular anatomy. CONTRAST:  OMNIPAQUE IOHEXOL 350 MG/ML SOLN COMPARISON:  CT abdomen and pelvis 07/29/2017 FINDINGS: VASCULAR Aorta: Diffuse atherosclerosis without evidence of a dissection, aneurysm, or significant stenosis. Celiac: Previous embolization with streak artifact limiting assessment. Gross patency of the splenic and hepatic arteries. SMA: Patent without evidence of aneurysm, dissection, vasculitis or significant stenosis. Renals: Both renal arteries are patent without evidence of aneurysm, dissection, vasculitis, fibromuscular dysplasia or significant stenosis. IMA: Patent with moderate to severe origin stenosis. Inflow: Patent with moderately extensive atherosclerosis. Mild-to-moderate right common iliac artery stenosis. Proximal Outflow: Patent with mild atherosclerosis. No evidence of significant  stenosis or dissection. Veins: No gross abnormality. Review of the MIP images confirms the above findings. NON-VASCULAR Lower chest: Paraseptal emphysema.  Trace right pleural effusion. Hepatobiliary: No focal liver abnormality is seen. The gallbladder is collapsed with a punctate stone in the region of the gallbladder neck. There is no biliary dilatation. Pancreas: Limited assessment due to streak artifact from embolization coils. Chronic pancreatic atrophy and calcifications suggesting chronic pancreatitis with similar hypodensity in the region of the pancreatic head and uncinate process. Spleen: Unremarkable. Adrenals/Urinary Tract: Unremarkable adrenal glands. Punctate bilateral renal calculi. No hydronephrosis. 1.3 cm left renal cyst. Unremarkable bladder. Stomach/Bowel: The stomach is grossly unremarkable. There is a moderate amount of stool in the colon. There is no evidence of bowel obstruction. Diverticulosis is again noted with chronic wall thickening in the proximal sigmoid colon with luminal narrowing also described on a 2017  colonoscopy. The appendix is unremarkable. Lymphatic: No enlarged lymph nodes. Reproductive: Similar prosthetic enlargement which indents the bladder base. Other: No intraperitoneal free fluid or free air. Musculoskeletal: Remote bilateral superior and inferior pubic rami fractures. Chronic mild L1 compression fracture. Focally advanced disc degeneration at L5-S1. IMPRESSION: 1. No evidence of acute mesenteric ischemia or other acute abnormality in the abdomen or pelvis. 2. 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 3. Colonic diverticulosis with similar appearance of chronic sigmoid colon wall thickening. 4. Chronic pancreatitis. 5. Cholelithiasis. 6. Nonobstructing bilateral nephrolithiasis. 7. Trace right pleural effusion. 8. Aortic Atherosclerosis (ICD10-I70.0) and Emphysema (ICD10-J43.9). Electronically Signed   By: Sebastian Ache M.D.   On: 02/02/2020 07:17      Discharge Exam: Vitals:   02/04/20 1300 02/04/20 1400  BP: (!) 162/76 (!) 117/45  Pulse: 90 90  Resp: 16 17  Temp:    SpO2: 97% 98%   Vitals:   02/04/20 1130 02/04/20 1200 02/04/20 1300 02/04/20 1400  BP: (!) 149/80 (!) 128/106 (!) 162/76 (!) 117/45  Pulse: 69 76 90 90  Resp: 13 20 16 17   Temp:      TempSrc:      SpO2: 97% 99% 97% 98%  Weight:      Height:       General: Pt is alert, awake, not in acute distress Cardiovascular:  normal S1/S2 +, no rubs, no gallops Respiratory: CTA bilaterally, no wheezing, no rhonchi Abdominal: Soft, NT, ND, bowel sounds + Extremities: no edema, no cyanosis   The results of significant diagnostics from this hospitalization (including imaging, microbiology, ancillary and laboratory) are listed below for reference.     Microbiology: Recent Results (from the past 240 hour(s))  SARS Coronavirus 2 by RT PCR (hospital order, performed in [facility] hospital lab) Nasopharyngeal Nasopharyngeal Swab     Status: None   Collection Time: 02/02/20 12:29 AM    Specimen: Nasopharyngeal Swab  Result Value Ref Range Status   SARS Coronavirus 2 NEGATIVE NEGATIVE Final    Comment: (NOTE) SARS-CoV-2 target nucleic acids are NOT DETECTED.  The SARS-CoV-2 RNA is generally detectable in upper and lower respiratory specimens during the acute phase of infection. The lowest concentration of SARS-CoV-2 viral copies this assay can detect is 250 copies / mL. A negative result does not preclude SARS-CoV-2 infection and should not be used as the sole basis for treatment or other patient management decisions.  A negative result may occur with improper specimen collection / handling, submission of specimen other than nasopharyngeal swab, presence of viral mutation(s) within the areas targeted by this assay, and inadequate number of viral  copies (<250 copies / mL). A negative result must be combined with clinical observations, patient history, and epidemiological information.  Fact Sheet for Patients:   BoilerBrush.com.cyhttps://www.fda.gov/media/136312/download  Fact Sheet for Healthcare Providers: https://pope.com/https://www.fda.gov/media/136313/download  This test is not yet approved or  cleared by the [place] FDA and has been authorized for detection and/or diagnosis of SARS-CoV-2 by FDA under an Emergency Use Authorization (EUA).  This EUA will remain in effect (meaning this test can be used) for the duration of the COVID-19 declaration under Section 564(b)(1) of the Act, 21 U.S.C. section 360bbb-3(b)(1), unless the authorization is terminated or revoked sooner.  Performed at [facility], [address] [place], [place] [address]   Blood Culture (routine x 2)     Status: None (Preliminary result)   Collection Time: 02/02/20 12:42 AM   Specimen: BLOOD  Result Value Ref Range Status   Specimen Description BLOOD LEFT ANTECUBITAL  Final   Special Requests   Final    BOTTLES DRAWN AEROBIC AND ANAEROBIC Blood Culture results may not be optimal due to an excessive volume of blood  received in culture bottles   Culture   Final    NO GROWTH 2 DAYS Performed at [facility], [address] [place], [place] [address]    Report Status PENDING  Incomplete  Blood Culture (routine x 2)     Status: None (Preliminary result)   Collection Time: 02/02/20 12:52 AM   Specimen: BLOOD  Result Value Ref Range Status   Specimen Description BLOOD RIGHT ANTECUBITAL  Final   Special Requests   Final    BOTTLES DRAWN AEROBIC AND ANAEROBIC Blood Culture adequate volume   Culture   Final    NO GROWTH 2 DAYS Performed at [facility], [address] [place], [place] [address]    Report Status PENDING  Incomplete  Urine culture     Status: Abnormal   Collection Time: 02/02/20  2:00 AM   Specimen: In/Out Cath Urine  Result Value Ref Range Status   Specimen Description   Final    IN/OUT CATH URINE Performed at [facility], [address] [place], [place] [address]    Special Requests   Final    NONE Performed at [facility], [address] [place], [place] [address]    Culture MULTIPLE SPECIES PRESENT, SUGGEST RECOLLECTION (A)  Final   Report Status 02/04/2020 FINAL  Final  MRSA PCR Screening     Status: None   Collection Time: 02/02/20 11:52 AM   Specimen: Nasal Mucosa; Nasopharyngeal  Result Value Ref Range Status   MRSA by PCR NEGATIVE NEGATIVE Final    Comment:        The GeneXpert MRSA Assay (FDA approved for NASAL specimens only), is one component of a comprehensive MRSA colonization surveillance program. It is not intended to diagnose MRSA infection nor to guide or monitor treatment for MRSA infections. Performed at [facility], [address] [place], [place] [address]      Labs: BNP (last 3 results) Recent Labs    02/03/20 0505 02/04/20 0447  BNP 420.0* 222.0*   Basic Metabolic Panel: Recent Labs  Lab 02/02/20 0022 02/03/20 0505 02/04/20 0447  NA 140 144 140  K 4.0 3.6 4.0  CL 104 106 111  CO2 23 27 22   GLUCOSE 124* 115* 152*  BUN 18 10 11    CREATININE 1.40* 0.69 0.72  CALCIUM 8.9 8.2* 7.5*  MG  --  1.2* 1.7   Liver Function Tests: Recent Labs  Lab 02/02/20 0022 02/03/20 0505 02/04/20 0447  AST 17 16 17   ALT 9 12 12   ALKPHOS 42 40 40  BILITOT 0.5 0.6 0.4  PROT 5.8* 5.8* 5.1*  ALBUMIN 3.5 3.3* 2.9*   No results for input(s): LIPASE, AMYLASE in the last 168 hours. No results for input(s): AMMONIA in the last 168 hours. CBC: Recent Labs  Lab 02/02/20 0022 02/03/20 0505 02/04/20 0447  WBC 7.4 4.9 4.2  NEUTROABS  --  3.2 2.5  HGB 11.2* 11.3* 10.9*  HCT 35.9* 35.2* 34.3*  MCV 93.7 90.5 93.0  PLT 186 176 170   Cardiac Enzymes: No results for input(s): CKTOTAL, CKMB, CKMBINDEX, TROPONINI in the last 168 hours. BNP: Invalid input(s): POCBNP CBG: Recent Labs  Lab 02/03/20 0750 02/03/20 1156 02/03/20 1628 02/04/20 0723 02/04/20 1132  GLUCAP 97 85 206* 143* 212*   D-Dimer No results for input(s): DDIMER in the last 72 hours. Hgb A1c Recent Labs    02/02/20 1602  HGBA1C 8.9*   Lipid Profile No results for input(s): CHOL, HDL, LDLCALC, TRIG, CHOLHDL, LDLDIRECT in the last 72 hours. Thyroid function studies No results for input(s): TSH, T4TOTAL, T3FREE, THYROIDAB in the last 72 hours.  Invalid input(s): FREET3 Anemia work up No results for input(s): VITAMINB12, FOLATE, FERRITIN, TIBC, IRON, RETICCTPCT in the last 72 hours. Urinalysis    Component Value Date/Time   COLORURINE YELLOW 02/02/2020 0205   APPEARANCEUR HAZY (A) 02/02/2020 0205   LABSPEC 1.019 02/02/2020 0205   PHURINE 5.0 02/02/2020 0205   GLUCOSEU 50 (A) 02/02/2020 0205   HGBUR LARGE (A) 02/02/2020 0205   BILIRUBINUR NEGATIVE 02/02/2020 0205   KETONESUR 5 (A) 02/02/2020 0205   PROTEINUR 30 (A) 02/02/2020 0205   UROBILINOGEN 0.2 01/22/2015 1000   NITRITE NEGATIVE 02/02/2020 0205   LEUKOCYTESUR NEGATIVE 02/02/2020 0205   Sepsis Labs Invalid input(s): PROCALCITONIN,  WBC,  LACTICIDVEN Microbiology Recent Results (from the past  240 hour(s))  SARS Coronavirus 2 by RT PCR (hospital order, performed in [facility] hospital lab) Nasopharyngeal Nasopharyngeal Swab     Status: None   Collection Time: 02/02/20 12:29 AM   Specimen: Nasopharyngeal Swab  Result Value Ref Range Status   SARS Coronavirus 2 NEGATIVE NEGATIVE Final    Comment: (NOTE) SARS-CoV-2 target nucleic acids are NOT DETECTED.  The SARS-CoV-2 RNA is generally detectable in upper and lower respiratory specimens during the acute phase of infection. The lowest concentration of SARS-CoV-2 viral copies this assay can detect is 250 copies / mL. A negative result does not preclude SARS-CoV-2 infection and should not be used as the sole basis for treatment or other patient management decisions.  A negative result may occur with improper specimen collection / handling, submission of specimen other than nasopharyngeal swab, presence of viral mutation(s) within the areas targeted by this assay, and inadequate number of viral copies (<250 copies / mL). A negative result must be combined with clinical observations, patient history, and epidemiological information.  Fact Sheet for Patients:   BoilerBrush.com.cy  Fact Sheet for Healthcare Providers: https://pope.com/  This test is not yet approved or  cleared by the [place] FDA and has been authorized for detection and/or diagnosis of SARS-CoV-2 by FDA under an Emergency Use Authorization (EUA).  This EUA will remain in effect (meaning this test can be used) for the duration of the COVID-19 declaration under Section 564(b)(1) of the Act, 21 U.S.C. section 360bbb-3(b)(1), unless the authorization is terminated or revoked sooner.  Performed at [facility], [id number]  [address] [place], [place] [address]   Blood Culture (routine x 2)     Status: None (Preliminary result)   Collection Time: 02/02/20 12:42 AM   Specimen: BLOOD  Result Value Ref Range Status    Specimen Description BLOOD LEFT ANTECUBITAL  Final   Special Requests   Final    BOTTLES DRAWN AEROBIC AND ANAEROBIC Blood Culture results may not be optimal due to an excessive volume of blood received in culture bottles   Culture   Final    NO GROWTH 2 DAYS Performed at [facility], [address], [place], [place] [address]    Report Status PENDING  Incomplete  Blood Culture (routine x 2)     Status: None (Preliminary result)   Collection Time: 02/02/20 12:52 AM   Specimen: BLOOD  Result Value Ref Range Status   Specimen Description BLOOD RIGHT ANTECUBITAL  Final   Special Requests   Final    BOTTLES DRAWN AEROBIC AND ANAEROBIC Blood Culture adequate volume   Culture   Final    NO GROWTH 2 DAYS Performed at [facility], [address], [place], [place] [address]    Report Status PENDING  Incomplete  Urine culture     Status: Abnormal   Collection Time: 02/02/20  2:00 AM   Specimen: In/Out Cath Urine  Result Value Ref Range Status   Specimen Description   Final    IN/OUT CATH URINE Performed at [facility], [address] [place], [place] [address]    Special Requests   Final    NONE Performed at [facility], [address] [place], [place] [address]    Culture MULTIPLE SPECIES PRESENT, SUGGEST RECOLLECTION (A)  Final   Report Status 02/04/2020 FINAL  Final  MRSA PCR Screening     Status: None   Collection Time: 02/02/20 11:52 AM   Specimen: Nasal Mucosa; Nasopharyngeal  Result Value Ref Range Status   MRSA by PCR NEGATIVE NEGATIVE Final    Comment:        The GeneXpert MRSA Assay (FDA approved for NASAL specimens only), is one component of a comprehensive MRSA colonization surveillance program. It is not intended to diagnose MRSA infection nor to guide or monitor treatment for MRSA infections. Performed at [facility], [address] [place], [place] [address]    Time coordinating discharge: 40 minutes  SIGNED:  Standley Dakins, MD  [facility] 02/04/2020, 2:43 PM How to contact the [facility] Attending or Consulting provider 7A - 7P or covering provider during after hours 7P -7A, for this patient?  1. Check the care team in [facility] and look for a) attending/consulting TR[facility] provider listed and b) the [facility] team listed 2. Log into [email or web address] and use [facility]'s universal password to access. If you do not have the password, please contact the hospital operator. 3. Locate the [facility] provider you are looking for under [facility] and page to a number that you can be directly reached. 4. If you still have difficulty reaching the provider, please page the [facility] (Director on Call) for the Hospitalists listed on amion for assistance.

## 2020-02-04 NOTE — TOC Transition Note (Signed)
Transition of Care [facility]) - CM/SW Discharge Note   Patient Details  Name: Joseph Bowen MRN: [id number] Date of Birth: Apr 22, 1946  Transition of [facility]) CM/SW Contact:  Annice Needy, LCSW Phone Number: 02/04/2020, 3:25 PM   Clinical Narrative:    [name] at [facility] advised of discharge and discharge clinicals sent to facility.  Guardian, [name], with [facility] DSS advised of discharge.  RN to call report and arrange transport. TOC signing off.    Final next level of care: Skilled Nursing Facility Barriers to Discharge: No Barriers Identified   Patient Goals and CMS Choice        Discharge Placement                       Discharge Plan and Services                                     Social Determinants of Health (SDOH) Interventions     Readmission Risk Interventions No flowsheet data found.

## 2020-02-04 NOTE — Care Management Important Message (Signed)
Important Message  Patient Details  Name: Joseph Bowen MRN: [id number] Date of Birth: 1945-09-25   Medicare Important Message Given:  Yes     Corey Harold 02/04/2020, 3:13 PM

## 2020-02-04 NOTE — Progress Notes (Signed)
Attempted to call report to [facility], no response. Will attempt again in a little while, if no response again, nursing staff can call hospital for updated report.

## 2020-02-04 NOTE — Progress Notes (Addendum)
Pt started being hypotensive, BP 73/42 (51) precedex gtt stopped paged MD ordered 1L fluid bolus and increase rate of NS to an hour

## 2020-02-07 ENCOUNTER — Other Ambulatory Visit: Payer: Self-pay | Admitting: Family Medicine

## 2020-02-07 DIAGNOSIS — K8020 Calculus of gallbladder without cholecystitis without obstruction: Secondary | ICD-10-CM

## 2020-02-07 LAB — CULTURE, BLOOD (ROUTINE X 2)
Culture: NO GROWTH
Culture: NO GROWTH
Special Requests: ADEQUATE

## 2020-02-12 ENCOUNTER — Emergency Department (HOSPITAL_COMMUNITY): Payer: Medicare Other

## 2020-02-12 ENCOUNTER — Other Ambulatory Visit: Payer: Self-pay

## 2020-02-12 ENCOUNTER — Emergency Department (HOSPITAL_COMMUNITY)
Admission: EM | Admit: 2020-02-12 | Discharge: 2020-02-13 | Disposition: A | Discharge status: Other Acute Inpatient Hospital | Payer: Medicare Other | Attending: Emergency Medicine | Admitting: Emergency Medicine

## 2020-02-12 ENCOUNTER — Encounter (HOSPITAL_COMMUNITY): Payer: Self-pay | Admitting: Emergency Medicine

## 2020-02-12 DIAGNOSIS — J449 Chronic obstructive pulmonary disease, unspecified: Secondary | ICD-10-CM | POA: Diagnosis not present

## 2020-02-12 DIAGNOSIS — I10 Essential (primary) hypertension: Secondary | ICD-10-CM | POA: Diagnosis not present

## 2020-02-12 DIAGNOSIS — F17200 Nicotine dependence, unspecified, uncomplicated: Secondary | ICD-10-CM | POA: Insufficient documentation

## 2020-02-12 DIAGNOSIS — E860 Dehydration: Secondary | ICD-10-CM | POA: Diagnosis not present

## 2020-02-12 DIAGNOSIS — N179 Acute kidney failure, unspecified: Secondary | ICD-10-CM | POA: Diagnosis not present

## 2020-02-12 DIAGNOSIS — E119 Type 2 diabetes mellitus without complications: Secondary | ICD-10-CM | POA: Insufficient documentation

## 2020-02-12 DIAGNOSIS — E039 Hypothyroidism, unspecified: Secondary | ICD-10-CM | POA: Insufficient documentation

## 2020-02-12 DIAGNOSIS — Z87438 Personal history of other diseases of male genital organs: Secondary | ICD-10-CM | POA: Diagnosis not present

## 2020-02-12 DIAGNOSIS — R410 Disorientation, unspecified: Secondary | ICD-10-CM | POA: Insufficient documentation

## 2020-02-12 DIAGNOSIS — Z79890 Hormone replacement therapy (postmenopausal): Secondary | ICD-10-CM | POA: Diagnosis not present

## 2020-02-12 DIAGNOSIS — R031 Nonspecific low blood-pressure reading: Secondary | ICD-10-CM | POA: Diagnosis not present

## 2020-02-12 DIAGNOSIS — R4182 Altered mental status, unspecified: Secondary | ICD-10-CM | POA: Diagnosis present

## 2020-02-12 DIAGNOSIS — Z794 Long term (current) use of insulin: Secondary | ICD-10-CM | POA: Diagnosis not present

## 2020-02-12 DIAGNOSIS — Z79899 Other long term (current) drug therapy: Secondary | ICD-10-CM | POA: Insufficient documentation

## 2020-02-12 DIAGNOSIS — Z20822 Contact with and (suspected) exposure to covid-19: Secondary | ICD-10-CM | POA: Insufficient documentation

## 2020-02-12 LAB — CBC WITH DIFFERENTIAL/PLATELET
Abs Immature Granulocytes: 0.02 10*3/uL (ref 0.00–0.07)
Basophils Absolute: 0 10*3/uL (ref 0.0–0.1)
Basophils Relative: 0 %
Eosinophils Absolute: 0.2 10*3/uL (ref 0.0–0.5)
Eosinophils Relative: 3 %
HCT: 36.5 % — ABNORMAL LOW (ref 39.0–52.0)
Hemoglobin: 11.4 g/dL — ABNORMAL LOW (ref 13.0–17.0)
Immature Granulocytes: 0 %
Lymphocytes Relative: 31 %
Lymphs Abs: 1.8 10*3/uL (ref 0.7–4.0)
MCH: 28.6 pg (ref 26.0–34.0)
MCHC: 31.2 g/dL (ref 30.0–36.0)
MCV: 91.7 fL (ref 80.0–100.0)
Monocytes Absolute: 0.9 10*3/uL (ref 0.1–1.0)
Monocytes Relative: 15 %
Neutro Abs: 2.9 10*3/uL (ref 1.7–7.7)
Neutrophils Relative %: 51 %
Platelets: 208 10*3/uL (ref 150–400)
RBC: 3.98 MIL/uL — ABNORMAL LOW (ref 4.22–5.81)
RDW: 14.7 % (ref 11.5–15.5)
WBC: 5.7 10*3/uL (ref 4.0–10.5)
nRBC: 0 % (ref 0.0–0.2)

## 2020-02-12 LAB — COMPREHENSIVE METABOLIC PANEL
ALT: 21 U/L (ref 0–44)
AST: 22 U/L (ref 15–41)
Albumin: 3.9 g/dL (ref 3.5–5.0)
Alkaline Phosphatase: 48 U/L (ref 38–126)
Anion gap: 10 (ref 5–15)
BUN: 28 mg/dL — ABNORMAL HIGH (ref 8–23)
CO2: 26 mmol/L (ref 22–32)
Calcium: 9.2 mg/dL (ref 8.9–10.3)
Chloride: 102 mmol/L (ref 98–111)
Creatinine, Ser: 1.17 mg/dL (ref 0.61–1.24)
GFR calc Af Amer: >60 mL/min (ref >60)
GFR calc non Af Amer: >60 mL/min (ref >60)
Glucose, Bld: 110 mg/dL — ABNORMAL HIGH (ref 70–99)
Potassium: 3.7 mmol/L (ref 3.5–5.1)
Sodium: 138 mmol/L (ref 135–145)
Total Bilirubin: 0.7 mg/dL (ref 0.3–1.2)
Total Protein: 6.3 g/dL — ABNORMAL LOW (ref 6.5–8.1)

## 2020-02-12 LAB — LACTIC ACID, PLASMA: Lactic Acid, Venous: 2.6 mmol/L (ref 0.5–1.9)

## 2020-02-12 MED ORDER — SODIUM CHLORIDE 0.9 % IV BOLUS
1000.0000 mL | Freq: Once | INTRAVENOUS | Status: AC
  Administered 2020-02-12: 1000 mL via INTRAVENOUS

## 2020-02-12 NOTE — ED Notes (Signed)
CRITICAL VALUE ALERT  Critical Value:  Lactic acid 2.6  Date & Time Notied:  2255 02/12/2020  Provider Notified: Dr. Criss Alvine  Orders Received/Actions taken: see chart

## 2020-02-12 NOTE — ED Triage Notes (Signed)
Pt is supposed to have hypotension but enroute by [facility], pt had bp 125/70 them 113/64   Blood sugar 147  [facility] staff sent to ED due to unable to have pt keep his IV intact  He is sent here for ED staff to maintain his IV and give fluids per EMS

## 2020-02-12 NOTE — ED Notes (Signed)
Call to [facility] to ascertain why pt is to have IV continuously  Person answering who identifies as supervisor reports that pt has lost 8 lbs in 4 days from 149 lb to 141.8 lb today   "something's just not right with him"  Reports that they have not had labs and sent here due to him being pale and not eating today   Blood sugar at noon was 400, then 258 at 2030   NP spoken to and asked that pt be sent here

## 2020-02-12 NOTE — ED Notes (Signed)
ED Provider at bedside. 

## 2020-02-13 LAB — URINALYSIS, ROUTINE W REFLEX MICROSCOPIC
Bilirubin Urine: NEGATIVE
Glucose, UA: >=500 mg/dL — AB
Hgb urine dipstick: NEGATIVE
Ketones, ur: NEGATIVE mg/dL
Leukocytes,Ua: NEGATIVE
Nitrite: NEGATIVE
Protein, ur: NEGATIVE mg/dL
Specific Gravity, Urine: 1.02 (ref 1.005–1.030)
pH: 5 (ref 5.0–8.0)

## 2020-02-13 LAB — SARS CORONAVIRUS 2 BY RT PCR (HOSPITAL ORDER, PERFORMED IN ~~LOC~~ HOSPITAL LAB): SARS Coronavirus 2: NEGATIVE

## 2020-02-13 LAB — LACTIC ACID, PLASMA: Lactic Acid, Venous: 2.5 mmol/L (ref 0.5–1.9)

## 2020-02-13 MED ORDER — SODIUM CHLORIDE 0.9 % IV BOLUS
500.0000 mL | Freq: Once | INTRAVENOUS | Status: AC
  Administered 2020-02-13: 500 mL via INTRAVENOUS

## 2020-02-13 NOTE — ED Provider Notes (Signed)
[facility] EMERGENCY DEPARTMENT Provider Note   CSN: [id number] Arrival date & time: 02/12/20  2140     History Chief Complaint  Patient presents with  . Hypotension    Joseph Bowen is a 74 y.o. male.  HPI     This is a 74 year old male with a history of dementia, chronic encephalopathy, hypertension, hypoglycemia, COPD who presents with reported altered mental status and low blood pressures.  Per EMS,, they were called out for hypotension.  He is at [facility].  However, per EMS his blood pressure was stable 113-125 systolic.  Per report, Lindaann Pascal was unable to keep an IV intact for fluids.  Patient is unable to provide much history given disorientation and history of dementia.  Based on chart review it appears he has chronic encephalopathy.  He is only oriented to himself.  He denies any any complaints at this time.  Denies pain.  Of note, he was recently hospitalized for encephalopathy and hypotension likely related to AKI and UTI.  Notes reviewed from that hospitalization.  Level 5 caveat for dementia  Past Medical History:  Diagnosis Date  . Alcohol-induced persisting amnestic disorder (HCC)   . Anemia   . Anxiety   . Atherosclerosis of arteries   . Benign prostatic hypertrophy   . Chronic pancreatitis (HCC)   . Constipation   . COPD (chronic obstructive pulmonary disease) (HCC)   . Dementia (HCC)   . Dementia (HCC)    due to encephalopathy from diabetic coma 2011.  . Depression   . Difficulty walking   . Dizziness and giddiness   . 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  . Essential hypertension   . Euthyroid sick syndrome   . Glaucoma   . H/O ETOH abuse   . History of falling   . Hydrocele   . Hypoglycemia   . Hypothyroidism   . Insomnia   . Legally blind   . Orthostatic hypotension   . Pain in left shoulder   . Personal history of alcoholism (HCC)   . Post traumatic stress disorder   . Psychosis (HCC)   . Radiculopathy, cervical  region   . Retinopathy   . Sleep terrors (night terrors)   . Syncope and collapse   . Type 2 diabetes mellitus (HCC)   . Urinary incontinence   . Vitamin B deficiency   . Vitamin D deficiency     Patient Active Problem List   Diagnosis Date Noted  . Hypomagnesemia 02/03/2020  . Hypothermia 02/03/2020  . AKI (acute kidney injury) (HCC) 02/02/2020  . UTI (urinary tract infection) 02/02/2020  . Lactic acidosis 02/02/2020  . Severe dehydration 02/02/2020  . 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 History of alcoholism (HCC) 02/09/2017  . Post traumatic stress disorder 02/09/2017  . Hydrocele 02/09/2017  . Impacted cerumen of right ear 02/09/2017  . Contusion of right upper back excluding scapular region 02/08/2017  . COPD (chronic obstructive pulmonary disease) (HCC) 01/18/2017  . BPH (benign prostatic hyperplasia) 01/18/2017  . Chronic pancreatitis (HCC) 01/18/2017  . Essential hypertension 01/18/2017  . Glaucoma 01/18/2017  . Hypothyroidism 01/18/2017  . Solitary pulmonary nodule 01/18/2017  . Vitamin B12 deficiency 01/18/2017  . Dementia with behavioral disturbance (HCC) 01/17/2017  . Slow transit constipation 09/04/2016  . Left cervical radiculopathy 07/26/2016  . Closed head injury 10/20/2015  . Fall 10/20/2015  . Heme + stool 07/27/2015  . 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 Memory loss 10/31/2014  .  Dizziness 07/04/2014  . Syncope 07/04/2014  . Type 2 diabetes mellitus with hyperglycemia, with long-term current use of insulin (HCC) 07/04/2014  . Fracture, pelvis closed (HCC) 12/01/2013  . Proximal phalanx fracture of finger 12/01/2013  . Fx metacarpal neck-closed 12/01/2013  . Pelvis fracture (HCC) 11/10/2012  . Pelvic fracture (HCC) 09/23/2012  . Dislocation of PIP joint of finger 09/23/2012  . Dupuytren's disease of palm 09/02/2012    Past Surgical History:  Procedure Laterality Date  . BACK SURGERY     removal of melanoma of  back.  . COLONOSCOPY WITH PROPOFOL N/A 08/15/2015   Dr. Darrick Penna: left colon redundant, pediatric scope used, sigmoid colon woult not adequately insufflate, moderate diverticulosis throughout entire colon, moderate sized IH   . EXTRACORPOREAL SHOCK WAVE LITHOTRIPSY Right 11/18/2016   Procedure: RIGHT EXTRACORPOREAL SHOCK WAVE LITHOTRIPSY (ESWL);  Surgeon: Barron Alvine, MD;  Location: [facility] ORS;  Service: Urology;  Laterality: Right;  . HAND SURGERY Right    middle finger; fractue  . HEMORRHOID SURGERY    . HERNIA REPAIR Left    inguinal  . HIP SURGERY Bilateral    bilateral hip fractures  . MOHS SURGERY     Melanoma       Family History  Problem Relation Age of Onset  . Heart disease Other   . Diabetes Other     Social History   Tobacco Use  . Smoking status: Current Some Day Smoker    Packs/day: 1.50    Years: 48.00    Pack years: 72.00    Types: Cigarettes    Start date: 09/17/1961    Last attempt to quit: 09/17/2009    Years since quitting: 10.4  . Smokeless tobacco: Never Used  Substance Use Topics  . Alcohol use: No    Alcohol/week: 0.0 standard drinks    Comment: hx of alcoholism-last used 2011  . Drug use: No    Home Medications Prior to Admission medications   Medication Sig Start Date End Date Taking? Authorizing Provider  atorvastatin (LIPITOR) 40 MG tablet Take 40 mg by mouth daily.    [provider]  fludrocortisone (FLORINEF) 0.1 MG tablet Take 1 tablet (0.1 mg total) by mouth daily. 08/15/16   Jonelle Sidle, MD  gabapentin (NEURONTIN) 400 MG capsule Take 400 mg by mouth 3 (three) times daily.    [provider]  HUMALOG 100 UNIT/ML injection Inject 2-12 Units into the skin 4 (four) times daily. 151-200 2units,201-250 4units,251-300 6units, 301-350 8units,351-400 10units,401-450 12units notify MD if greader than 400 01/31/20   [provider]  hydrALAZINE (APRESOLINE) 25 MG tablet Take 1 tablet (25 mg total) by mouth 3 (three) times  daily. HOLD FOR SBP<130 02/04/20   Johnson, Clanford L, MD  insulin glargine (LANTUS) 100 UNIT/ML injection Inject 0.12 mLs (12 Units total) into the skin daily. 02/04/20   Cleora Fleet, MD  Levothyroxine Sodium 137 MCG CAPS Take 137 mcg by mouth daily before breakfast.     [provider]  lipase/protease/amylase (CREON) 36000 UNITS CPEP capsule Take 1 capsule (36,000 Units total) by mouth 3 (three) times daily with meals. 09/10/16   Filbert Schilder, MD  LORazepam (ATIVAN) 0.5 MG tablet Take 1 tablet (0.5 mg total) by mouth 2 (two) times daily. 02/04/20   Johnson, Clanford L, MD  Melatonin 1 MG TABS Take 1 mg by mouth at bedtime.     [provider]  memantine (NAMENDA) 5 MG tablet Take 5 mg by mouth 2 (two)  times daily.    [provider]  metFORMIN (GLUCOPHAGE) 1000 MG tablet Take 1,000 mg by mouth 2 (two) times daily with a meal.    [provider]  OLANZapine (ZYPREXA) 5 MG tablet Take 15 mg by mouth every evening.  09/19/19   [provider]  sennosides-docusate sodium (SENOKOT-S) 8.6-50 MG tablet Take 1 tablet by mouth 2 (two) times daily.    [provider]  sertraline (ZOLOFT) 50 MG tablet Take 50 mg by mouth daily.     [provider]  sitaGLIPtin (JANUVIA) 100 MG tablet Take 100 mg by mouth daily.     [provider]  thiamine 100 MG tablet Take 100 mg by mouth daily.    [provider]  traZODone (DESYREL) 50 MG tablet Take 50 mg by mouth at bedtime.    [provider]    Allergies    Patient has no known allergies.  Review of Systems   Review of Systems  Unable to perform ROS: Dementia    Physical Exam Updated Vital Signs BP (!) 112/63 (BP Location: Left Arm)   Pulse 72   Temp 98.4 F (36.9 C) (Oral)   Resp 12   Ht 1.727 m (5\' 8" )   Wt 67.8 kg   SpO2 96%   BMI 22.73 kg/m   Physical Exam Vitals and nursing note reviewed.  Constitutional:      Appearance: He is  well-developed.     Comments: Chronically ill-appearing but nontoxic, no acute distress, elderly  HENT:     Head: Normocephalic and atraumatic.     Nose: Nose normal.     Mouth/Throat:     Mouth: Mucous membranes are dry.  Eyes:     Pupils: Pupils are equal, round, and reactive to light.  Cardiovascular:     Rate and Rhythm: Normal rate and regular rhythm.     Heart sounds: Normal heart sounds. No murmur heard.   Pulmonary:     Effort: Pulmonary effort is normal. No respiratory distress.     Breath sounds: Normal breath sounds. No wheezing.  Abdominal:     General: Bowel sounds are normal.     Palpations: Abdomen is soft.     Tenderness: There is no abdominal tenderness. There is no rebound.  Musculoskeletal:     Cervical back: Neck supple.     Right lower leg: No edema.     Left lower leg: No edema.  Skin:    General: Skin is warm and dry.  Neurological:     Mental Status: He is alert.     Comments: Oriented only to self, follows commands, 5 out of 5 strength in all 4 extremities  Psychiatric:        Mood and Affect: Mood normal.     ED Results / Procedures / Treatments   Labs (all labs ordered are listed, but only abnormal results are displayed) Labs Reviewed  COMPREHENSIVE METABOLIC PANEL - Abnormal; Notable for the following components:      Result Value   Glucose, Bld 110 (*)    BUN 28 (*)    Total Protein 6.3 (*)    All other components within normal limits  CBC WITH DIFFERENTIAL/PLATELET - Abnormal; Notable for the following components:   RBC 3.98 (*)    Hemoglobin 11.4 (*)    HCT 36.5 (*)    All other components within normal limits  LACTIC ACID, PLASMA - Abnormal; Notable for the following components:   Lactic Acid,  Venous 2.6 (*)    All other components within normal limits  LACTIC ACID, PLASMA - Abnormal; Notable for the following components:   Lactic Acid, Venous 2.5 (*)    All other components within normal limits  URINALYSIS, ROUTINE W REFLEX  MICROSCOPIC - Abnormal; Notable for the following components:   Glucose, UA >=500 (*)    Bacteria, UA RARE (*)    All other components within normal limits  CULTURE, BLOOD (ROUTINE X 2)  CULTURE, BLOOD (ROUTINE X 2)  SARS CORONAVIRUS 2 BY RT PCR (HOSPITAL ORDER, PERFORMED IN [place] [facility])  URINE CULTURE    EKG EKG Interpretation  Date/Time:  Saturday February 12 2020 23:11:47 EDT Ventricular Rate:  69 PR Interval:    QRS Duration: 98 QT Interval:  427 QTC Calculation: 458 R Axis:   72 Text Interpretation: Sinus rhythm Low voltage, precordial leads Confirmed by Ross Marcus ([id number]) on 02/13/2020 12:56:04 AM   Radiology CT Head Wo Contrast  Result Date: 02/13/2020 CLINICAL DATA:  Hypotension and delirium EXAM: CT HEAD WITHOUT CONTRAST TECHNIQUE: Contiguous axial images were obtained from the base of the skull through the vertex without intravenous contrast. COMPARISON:  02/02/2020 FINDINGS: Brain: Chronic atrophic changes and chronic white matter ischemic changes are noted. Ventriculomegaly is again identified. No findings to suggest acute hemorrhage, acute infarction or space-occupying mass lesion are noted. Vascular: No hyperdense vessel or unexpected calcification. Skull: Normal. Negative for fracture or focal lesion. Sinuses/Orbits: No acute finding. Other: None. IMPRESSION: Stable chronic atrophic and ischemic changes. Stable ventriculomegaly. No acute abnormality is noted. Electronically Signed   By: Alcide Clever M.D.   On: 02/13/2020 00:50   DG Chest Portable 1 View  Result Date: 02/12/2020 CLINICAL DATA:  Altered mental status, hypotension EXAM: PORTABLE CHEST 1 VIEW COMPARISON:  Radiograph 02/03/2020 FINDINGS: Reticular opacities in the left lung base with a combination of atelectasis and scarring. No consolidation, features of edema, pneumothorax, or effusion. Pulmonary vascularity is normally distributed. The aorta is calcified. The remaining cardiomediastinal contours  are unremarkable. No acute osseous or soft tissue abnormality. Telemetry leads overlie the chest. IMPRESSION: Atelectasis and scarring in the left lung base. No other acute cardiopulmonary disease. Aortic Atherosclerosis (ICD10-I70.0). Electronically Signed   By: Kreg Shropshire M.D.   On: 02/12/2020 23:26    Procedures Procedures (including critical care time)  Medications Ordered in ED Medications  sodium chloride 0.9 % bolus 1,000 mL (0 mLs Intravenous Stopped 02/13/20 0008)  sodium chloride 0.9 % bolus 500 mL (0 mLs Intravenous Stopped 02/13/20 0106)    ED Course  I have reviewed the triage vital signs and the nursing notes.  Pertinent labs & imaging results that were available during my care of the patient were reviewed by me and considered in my medical decision making (see chart for details).    MDM Rules/Calculators/A&P                           Patient presents with reported hypotension.  However, blood pressure stable for EMS and no episodes of hypotension here.  Work-up initiated by prior provider including sepsis work-up given recent history of UTI and similar presentation.  Urinalysis today without obvious urinary tract infection.  Lactate mildly elevated 2.5.  Creatinine slightly elevated 1.17 with a BUN of 28.  This is likely worsening of his baseline encephalopathy.  No significant leukocytosis to suggest infection.  Chest x-ray is stable without pneumothorax or pneumonia.  CT head  shows no evidence of bleed or intracranial pathology.  Patient has remained clinically stable.  No episodes of hypotension.  He was given fluids and is able to tolerate fluids orally.  Suspect he may have mild dehydration resulting in mild AKI and uremia.  He appears to be at his baseline is nontoxic without signs or symptoms of sepsis.  Will discharge back to his living facility.  After history, exam, and medical workup I feel the patient has been appropriately medically screened and is safe for discharge  home. Pertinent diagnoses were discussed with the patient. Patient was given return precautions.   Final Clinical Impression(s) / ED Diagnoses Final diagnoses:  Disorientation  AKI (acute kidney injury) (HCC)  Dehydration    Rx / DC Orders ED Discharge Orders    None       Lateefa Crosby, Mayer Maskerourtney F, MD 02/13/20 [phone]

## 2020-02-13 NOTE — Discharge Instructions (Addendum)
Patient was seen for altered mental status and hypotension.  He did not have any episodes of hypotension while in the emergency department.  He does appear to be somewhat mildly dehydrated.  He was given some fluids and is able to tolerate fluids by mouth.  Other work-up including infectious work-up was reassuring.

## 2020-02-13 NOTE — ED Notes (Signed)
Date and time results received: 02/13/20 0010   Test: Lactic Critical Value: 2.5  Name of Provider Notified:  Horton, MD

## 2020-02-14 LAB — URINE CULTURE: Culture: <10000 — AB

## 2020-02-17 LAB — CULTURE, BLOOD (ROUTINE X 2)
Culture: NO GROWTH
Culture: NO GROWTH
Special Requests: ADEQUATE
Special Requests: ADEQUATE

## 2020-03-02 ENCOUNTER — Other Ambulatory Visit: Payer: Self-pay

## 2020-03-02 ENCOUNTER — Encounter: Payer: Self-pay | Admitting: General Surgery

## 2020-03-02 ENCOUNTER — Ambulatory Visit (INDEPENDENT_AMBULATORY_CARE_PROVIDER_SITE_OTHER): Payer: Medicare Other | Admitting: General Surgery

## 2020-03-02 DIAGNOSIS — K8020 Calculus of gallbladder without cholecystitis without obstruction: Secondary | ICD-10-CM | POA: Diagnosis not present

## 2020-03-02 NOTE — Patient Instructions (Signed)

## 2020-03-02 NOTE — Progress Notes (Signed)
Joseph Bowen; [id number]; [birth year]   HPI Patient is a 74 year old white male who resides in an assisted living facility who was referred to my care for CT findings of cholelithiasis while being worked up for abdominal pain.  He was also noted to have nephrolithiasis.  History is obtained from a family member as he is a poor historian.  His past medical history is extensive.  He has had episodes of chronic pancreatitis in the past.  Family is most concerned about his decreased appetite.  Is difficult to certain whether he has had any right upper quadrant abdominal pain and nausea.  He has had liver enzyme tests in the past which have been negative. Past Medical History:  Diagnosis Date  . Alcohol-induced persisting amnestic disorder (HCC)   . Anemia   . Anxiety   . Atherosclerosis of arteries   . Benign prostatic hypertrophy   . Chronic pancreatitis (HCC)   . Constipation   . COPD (chronic obstructive pulmonary disease) (HCC)   . Dementia (HCC)   . Dementia (HCC)    due to encephalopathy from diabetic coma 2011.  . Depression   . Difficulty walking   . Dizziness and giddiness   . 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  . Essential hypertension   . Euthyroid sick syndrome   . Glaucoma   . H/O ETOH abuse   . History of falling   . Hydrocele   . Hypoglycemia   . Hypothyroidism   . Insomnia   . Legally blind   . Orthostatic hypotension   . Pain in left shoulder   . Personal history of alcoholism (HCC)   . Post traumatic stress disorder   . Psychosis (HCC)   . Radiculopathy, cervical region   . Retinopathy   . Sleep terrors (night terrors)   . Syncope and collapse   . Type 2 diabetes mellitus (HCC)   . Urinary incontinence   . Vitamin B deficiency   . Vitamin D deficiency     Past Surgical History:  Procedure Laterality Date  . BACK SURGERY     removal of melanoma of back.  . COLONOSCOPY WITH PROPOFOL N/A 08/15/2015   Dr. Darrick Penna: left colon redundant,  pediatric scope used, sigmoid colon woult not adequately insufflate, moderate diverticulosis throughout entire colon, moderate sized IH   . EXTRACORPOREAL SHOCK WAVE LITHOTRIPSY Right 11/18/2016   Procedure: RIGHT EXTRACORPOREAL SHOCK WAVE LITHOTRIPSY (ESWL);  Surgeon: Barron Alvine, MD;  Location: [facility];  Service: Urology;  Laterality: Right;  . HAND SURGERY Right    middle finger; fractue  . HEMORRHOID SURGERY    . HERNIA REPAIR Left    inguinal  . HIP SURGERY Bilateral    bilateral hip fractures  . MOHS SURGERY     Melanoma    Family History  Problem Relation Age of Onset  . Heart disease Other   . Diabetes Other     Current Outpatient Medications on File Prior to Visit  Medication Sig Dispense Refill  . atorvastatin (LIPITOR) 40 MG tablet Take 40 mg by mouth daily.    . fludrocortisone (FLORINEF) 0.1 MG tablet Take 1 tablet (0.1 mg total) by mouth daily. 30 tablet 6  . gabapentin (NEURONTIN) 400 MG capsule Take 400 mg by mouth 3 (three) times daily.    Marland Kitchen HUMALOG 100 UNIT/ML injection Inject 2-12 Units into the skin 4 (four) times daily. 151-200 2units,201-250 4units,251-300 6units, 301-350 8units,351-400 10units,401-450 12units notify MD if greader than 400    .  hydrALAZINE (APRESOLINE) 25 MG tablet Take 1 tablet (25 mg total) by mouth 3 (three) times daily. HOLD FOR SBP<130    . insulin glargine (LANTUS) 100 UNIT/ML injection Inject 0.12 mLs (12 Units total) into the skin daily. 10 mL 11  . Levothyroxine Sodium 137 MCG CAPS Take 137 mcg by mouth daily before breakfast.     . lipase/protease/amylase (CREON) 36000 UNITS CPEP capsule Take 1 capsule (36,000 Units total) by mouth 3 (three) times daily with meals. 270 capsule 0  . LORazepam (ATIVAN) 0.5 MG tablet Take 1 tablet (0.5 mg total) by mouth 2 (two) times daily. 10 tablet 0  . Melatonin 1 MG TABS Take 1 mg by mouth at bedtime.     . memantine (NAMENDA) 5 MG tablet Take 5 mg by mouth 2 (two) times daily.    . metFORMIN  (GLUCOPHAGE) 1000 MG tablet Take 1,000 mg by mouth 2 (two) times daily with a meal.    . OLANZapine (ZYPREXA) 5 MG tablet Take 15 mg by mouth every evening.     . sennosides-docusate sodium (SENOKOT-S) 8.6-50 MG tablet Take 1 tablet by mouth 2 (two) times daily.    . sertraline (ZOLOFT) 50 MG tablet Take 50 mg by mouth daily.     . sitaGLIPtin (JANUVIA) 100 MG tablet Take 100 mg by mouth daily.     Marland Kitchen thiamine 100 MG tablet Take 100 mg by mouth daily.    . traZODone (DESYREL) 50 MG tablet Take 50 mg by mouth at bedtime.     No current facility-administered medications on file prior to visit.    No Known Allergies  Social History   Substance and Sexual Activity  Alcohol Use No  . Alcohol/week: 0.0 standard drinks   Comment: hx of alcoholism-last used 2011    Social History   Tobacco Use  Smoking Status Current Some Day Smoker  . Packs/day: 1.50  . Years: 48.00  . Pack years: 72.00  . Types: Cigarettes  . Start date: 09/17/1961  . Last attempt to quit: 09/17/2009  . Years since quitting: 10.4  Smokeless Tobacco Never Used    Review of Systems  Unable to perform ROS: Dementia    Objective   There were no vitals filed for this visit.  Physical Exam Vitals reviewed. Exam conducted with a chaperone present.  Constitutional:      Appearance: Normal appearance. He is normal weight. He is not ill-appearing.     Comments: Patient needs a wheelchair for some ambulation.  HENT:     Head: Normocephalic and atraumatic.  Cardiovascular:     Rate and Rhythm: Normal rate and regular rhythm.     Heart sounds: Normal heart sounds. No murmur heard.  No friction rub. No gallop.   Pulmonary:     Effort: Pulmonary effort is normal. No respiratory distress.     Breath sounds: Normal breath sounds. No stridor. No wheezing, rhonchi or rales.  Abdominal:     General: Abdomen is flat. Bowel sounds are normal. There is no distension.     Palpations: Abdomen is soft. There is no mass.      Tenderness: There is no abdominal tenderness. There is no guarding or rebound.     Hernia: No hernia is present.  Skin:    General: Skin is warm and dry.  Neurological:     Mental Status: He is disoriented.   Chart and recent admissions reviewed  Assessment  Cholelithiasis, no significant biliary colic.  Multiple medical problems which  makes it hard to discern whether a cholecystectomy would be warranted.  He has multiple comorbidities which makes him at high risk for any surgical intervention. Plan   I do not recommend cholecystectomy at this time.  The family agrees.  Should he develop significant signs and symptoms of cholecystitis, I recommend admission to the hospital so this could be further worked up.  Family understands and agrees.

## 2020-03-29 ENCOUNTER — Encounter: Payer: Self-pay | Admitting: Urology

## 2020-03-29 ENCOUNTER — Other Ambulatory Visit: Payer: Self-pay

## 2020-03-29 ENCOUNTER — Ambulatory Visit (INDEPENDENT_AMBULATORY_CARE_PROVIDER_SITE_OTHER): Payer: Medicare Other | Admitting: Urology

## 2020-03-29 VITALS — BP 96/58 | HR 97 | Temp 98.8°F | Ht 68.0 in | Wt 145.0 lb

## 2020-03-29 DIAGNOSIS — N200 Calculus of kidney: Secondary | ICD-10-CM | POA: Diagnosis not present

## 2020-03-29 LAB — URINALYSIS, ROUTINE W REFLEX MICROSCOPIC
Bilirubin, UA: NEGATIVE
Ketones, UA: NEGATIVE
Leukocytes,UA: NEGATIVE
Nitrite, UA: NEGATIVE
Protein,UA: NEGATIVE
RBC, UA: NEGATIVE
Specific Gravity, UA: 1.02 (ref 1.005–1.030)
Urobilinogen, Ur: 0.2 mg/dL (ref 0.2–1.0)
pH, UA: 5.5 (ref 5.0–7.5)

## 2020-03-29 NOTE — Progress Notes (Signed)
03/29/2020 9:42 AM   Joseph Bowen Nov 22, 1945 [id number]  Referring provider: Colon Branch, MD [address] [place],  [place] [address]  Nephrolithiasis  HPI: Joseph Bowen is a 74yo here for evaluation of nephrolithiasis. He was admitted on 02/02/2020 with altered mental statis and underwent CT which showed small bilateral calculi. No flank pain. No LUTS. pateitn drinks 8oz of water daily. No prior hx of nephrolithiasis   PMH: Past Medical History:  Diagnosis Date  . Alcohol-induced persisting amnestic disorder (HCC)   . Anemia   . Anxiety   . Atherosclerosis of arteries   . Benign prostatic hypertrophy   . Chronic pancreatitis (HCC)   . Constipation   . COPD (chronic obstructive pulmonary disease) (HCC)   . Dementia (HCC)   . Dementia (HCC)    due to encephalopathy from diabetic coma 2011.  . Depression   . Difficulty walking   . Dizziness and giddiness   . 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  . Essential hypertension   . Euthyroid sick syndrome   . Glaucoma   . H/O ETOH abuse   . History of falling   . Hydrocele   . Hypoglycemia   . Hypothyroidism   . Insomnia   . Kidney stone   . Legally blind   . Orthostatic hypotension   . Pain in left shoulder   . Personal history of alcoholism (HCC)   . Post traumatic stress disorder   . Psychosis (HCC)   . Radiculopathy, cervical region   . Retinopathy   . Sleep terrors (night terrors)   . Syncope and collapse   . Type 2 diabetes mellitus (HCC)   . Urinary incontinence   . Vitamin B deficiency   . Vitamin D deficiency     Surgical History: Past Surgical History:  Procedure Laterality Date  . BACK SURGERY     removal of melanoma of back.  . COLONOSCOPY WITH PROPOFOL N/A 08/15/2015   Dr. Darrick Penna: left colon redundant, pediatric scope used, sigmoid colon woult not adequately insufflate, moderate diverticulosis throughout entire colon, moderate sized IH   . EXTRACORPOREAL SHOCK WAVE LITHOTRIPSY  Right 11/18/2016   Procedure: RIGHT EXTRACORPOREAL SHOCK WAVE LITHOTRIPSY (ESWL);  Surgeon: Barron Alvine, MD;  Location: [facility];  Service: Urology;  Laterality: Right;  . HAND SURGERY Right    middle finger; fractue  . HEMORRHOID SURGERY    . HERNIA REPAIR Left    inguinal  . HIP SURGERY Bilateral    bilateral hip fractures  . MOHS SURGERY     Melanoma    Home Medications:  Allergies as of 03/29/2020   No Known Allergies     Medication List       Accurate as of March 29, 2020  9:42 AM. If you have any questions, ask your nurse or doctor.        atorvastatin 40 MG tablet Commonly known as: LIPITOR Take 40 mg by mouth daily.   ferrous sulfate 325 (65 FE) MG tablet Take 325 mg by mouth daily with breakfast.   fludrocortisone 0.1 MG tablet Commonly known as: FLORINEF Take 1 tablet (0.1 mg total) by mouth daily.   gabapentin 400 MG capsule Commonly known as: NEURONTIN Take 400 mg by mouth 3 (three) times daily.   HumaLOG 100 UNIT/ML injection Generic drug: insulin lispro Inject 2-12 Units into the skin 4 (four) times daily. 151-200 2units,201-250 4units,251-300 6units, 301-350 8units,351-400 10units,401-450 12units notify MD if greader than 400  hydrALAZINE 25 MG tablet Commonly known as: APRESOLINE Take 1 tablet (25 mg total) by mouth 3 (three) times daily. HOLD FOR SBP<130   insulin glargine 100 UNIT/ML injection Commonly known as: LANTUS Inject 0.12 mLs (12 Units total) into the skin daily.   Levothyroxine Sodium 137 MCG Caps Take 137 mcg by mouth daily before breakfast.   levothyroxine 137 MCG tablet Commonly known as: SYNTHROID Take 137 mcg by mouth daily.   lipase/protease/amylase 86578 UNITS Cpep capsule Commonly known as: CREON Take 1 capsule (36,000 Units total) by mouth 3 (three) times daily with meals.   LORazepam 0.5 MG tablet Commonly known as: ATIVAN Take 1 tablet (0.5 mg total) by mouth 2 (two) times daily.   melatonin 1 MG Tabs  tablet Take 1 mg by mouth at bedtime.   memantine 5 MG tablet Commonly known as: NAMENDA Take 5 mg by mouth 2 (two) times daily.   metFORMIN 1000 MG tablet Commonly known as: GLUCOPHAGE Take 1,000 mg by mouth 2 (two) times daily with a meal.   mirtazapine 7.5 MG tablet Commonly known as: REMERON Take 7.5 mg by mouth at bedtime.   OLANZapine 5 MG tablet Commonly known as: ZYPREXA Take 15 mg by mouth every evening.   OLANZapine zydis 5 MG disintegrating tablet Commonly known as: ZYPREXA SMARTSIG:1 Tablet(s) By Mouth Every Evening   sennosides-docusate sodium 8.6-50 MG tablet Commonly known as: SENOKOT-S Take 1 tablet by mouth 2 (two) times daily.   sertraline 50 MG tablet Commonly known as: ZOLOFT Take 50 mg by mouth daily.   sitaGLIPtin 100 MG tablet Commonly known as: JANUVIA Take 100 mg by mouth daily.   thiamine 100 MG tablet Take 100 mg by mouth daily.   traZODone 50 MG tablet Commonly known as: DESYREL Take 50 mg by mouth at bedtime.       Allergies: No Known Allergies  Family History: Family History  Problem Relation Age of Onset  . Heart disease Other   . Diabetes Other     Social History:  reports that he has been smoking cigarettes. He started smoking about 58 years ago. He has a 72.00 pack-year smoking history. He has never used smokeless tobacco. He reports that he does not drink alcohol and does not use drugs.  ROS: All other review of systems were reviewed and are negative except what is noted above in HPI  Physical Exam: BP (!) 96/58   Pulse 97   Temp 98.8 F (37.1 C)   Ht 5\' 8"  (1.727 m)   Wt 145 lb (65.8 kg)   BMI 22.05 kg/m   Constitutional:  Alert and oriented, No acute distress. HEENT: [place] AT, moist mucus membranes.  Trachea midline, no masses. Cardiovascular: No clubbing, cyanosis, or edema. Respiratory: Normal respiratory effort, no increased work of breathing. GI: Abdomen is soft, nontender, nondistended, no abdominal  masses GU: No CVA tenderness.  Lymph: No cervical or inguinal lymphadenopathy. Skin: No rashes, bruises or suspicious lesions. Neurologic: Grossly intact, no focal deficits, moving all 4 extremities. Psychiatric: Normal mood and affect.  Laboratory Data: Lab Results  Component Value Date   WBC 5.7 02/12/2020   HGB 11.4 (L) 02/12/2020   HCT 36.5 (L) 02/12/2020   MCV 91.7 02/12/2020   PLT 208 02/12/2020    Lab Results  Component Value Date   CREATININE 1.17 02/12/2020    No results found for: PSA  No results found for: TESTOSTERONE  Lab Results  Component Value Date   HGBA1C 8.9 (H) 02/02/2020  Urinalysis    Component Value Date/Time   COLORURINE YELLOW 02/13/2020 0030   APPEARANCEUR CLEAR 02/13/2020 0030   LABSPEC 1.020 02/13/2020 0030   PHURINE 5.0 02/13/2020 0030   GLUCOSEU >=500 (A) 02/13/2020 0030   HGBUR NEGATIVE 02/13/2020 0030   BILIRUBINUR NEGATIVE 02/13/2020 0030   KETONESUR NEGATIVE 02/13/2020 0030   PROTEINUR NEGATIVE 02/13/2020 0030   UROBILINOGEN 0.2 01/22/2015 1000   NITRITE NEGATIVE 02/13/2020 0030   LEUKOCYTESUR NEGATIVE 02/13/2020 0030    Lab Results  Component Value Date   BACTERIA RARE (A) 02/13/2020    Pertinent Imaging: CT abd/pelvis 02/02/2020: Images reviewed and dicussed with the patient Results for orders placed during the hospital encounter of 11/18/16  DG Abd 1 View  Narrative CLINICAL DATA:  Pre lithotripsy.  EXAM: ABDOMEN - 1 VIEW  COMPARISON:  11/08/2016  FINDINGS: Right renal calculus measuring 1.6 cm is again identified in the expected location of the inferior pole of right kidney. No additional urinary tract calculi identified. Moderate stool burden noted throughout the colon.  IMPRESSION: 1. 1.6 cm right renal calculus again noted. 2. Moderate stool burden within the colon.   Electronically Signed By: Signa Kellaylor  Stroud M.D. On: 11/18/2016 08:32  No results found for this or any previous visit.  No  results found for this or any previous visit.  No results found for this or any previous visit.  No results found for this or any previous visit.  No results found for this or any previous visit.  No results found for this or any previous visit.  No results found for this or any previous visit.   Assessment & Plan:    1. Kidney stones -We discussed the management of kidney stones. These options include observation, ureteroscopy, shockwave lithotripsy (ESWL) and percutaneous nephrolithotomy (PCNL). We discussed which options are relevant to the patient's stone(s). We discussed the natural history of kidney stones as well as the complications of untreated stones and the impact on quality of life without treatment as well as with each of the above listed treatments. We also discussed the efficacy of each treatment in its ability to clear the stone burden. With any of these management options I discussed the signs and symptoms of infection and the need for emergent treatment should these be experienced. For each option we discussed the ability of each procedure to clear the patient of their stone burden.   For observation I described the risks which include but are not limited to silent renal damage, life-threatening infection, need for emergent surgery, failure to pass stone and pain.   For ureteroscopy I described the risks which include bleeding, infection, damage to contiguous structures, positioning injury, ureteral stricture, ureteral avulsion, ureteral injury, need for prolonged ureteral stent, inability to perform ureteroscopy, need for an interval procedure, inability to clear stone burden, stent discomfort/pain, heart attack, stroke, pulmonary embolus and the inherent risks with general anesthesia.   For shockwave lithotripsy I described the risks which include arrhythmia, kidney contusion, kidney hemorrhage, need for transfusion, pain, inability to adequately break up stone, inability to  pass stone fragments, Steinstrasse, infection associated with obstructing stones, need for alternate surgical procedure, need for repeat shockwave lithotripsy, MI, CVA, PE and the inherent risks with anesthesia/conscious sedation.   For PCNL I described the risks including positioning injury, pneumothorax, hydrothorax, need for chest tube, inability to clear stone burden, renal laceration, arterial venous fistula or malformation, need for embolization of kidney, loss of kidney or renal function, need for repeat procedure, need  for prolonged nephrostomy tube, ureteral avulsion, MI, CVA, PE and the inherent risks of general anesthesia.   - The patient would like to proceed with observation. Increase water intake to 64oz daily. RTC 6 months with renal US - Urinalysis, Routine w reflex microscopic   No follow-ups on file.  Wilkie Aye, MD  [facility] [place]

## 2020-03-29 NOTE — Patient Instructions (Signed)
Dietary Guidelines to Help Prevent Kidney Stones Kidney stones are deposits of minerals and salts that form inside your kidneys. Your risk of developing kidney stones may be greater depending on your diet, your lifestyle, the medicines you take, and whether you have certain medical conditions. Most people can reduce their chances of developing kidney stones by following the instructions below. Depending on your overall health and the type of kidney stones you tend to develop, your dietitian may give you more specific instructions. What are tips for following this plan? Reading food labels  Choose foods with "no salt added" or "low-salt" labels. Limit your sodium intake to less than 1500 mg per day.  Choose foods with calcium for each meal and snack. Try to eat about 300 mg of calcium at each meal. Foods that contain 200-500 mg of calcium per serving include: ? 8 oz (237 ml) of milk, fortified nondairy milk, and fortified fruit juice. ? 8 oz (237 ml) of kefir, yogurt, and soy yogurt. ? 4 oz (118 ml) of tofu. ? 1 oz of cheese. ? 1 cup (300 g) of dried figs. ? 1 cup (91 g) of cooked broccoli. ? 1-3 oz can of sardines or mackerel.  Most people need 1000 to 1500 mg of calcium each day. Talk to your dietitian about how much calcium is recommended for you. Shopping  Buy plenty of fresh fruits and vegetables. Most people do not need to avoid fruits and vegetables, even if they contain nutrients that may contribute to kidney stones.  When shopping for convenience foods, choose: ? Whole pieces of fruit. ? Premade salads with dressing on the side. ? Low-fat fruit and yogurt smoothies.  Avoid buying frozen meals or prepared deli foods.  Look for foods with live cultures, such as yogurt and kefir. Cooking  Do not add salt to food when cooking. Place a salt shaker on the table and allow each person to add his or her own salt to taste.  Use vegetable protein, such as beans, textured vegetable  protein (TVP), or tofu instead of meat in pasta, casseroles, and soups. Meal planning   Eat less salt, if told by your dietitian. To do this: ? Avoid eating processed or premade food. ? Avoid eating fast food.  Eat less animal protein, including cheese, meat, poultry, or fish, if told by your dietitian. To do this: ? Limit the number of times you have meat, poultry, fish, or cheese each week. Eat a diet free of meat at least 2 days a week. ? Eat only one serving each day of meat, poultry, fish, or seafood. ? When you prepare animal protein, cut pieces into small portion sizes. For most meat and fish, one serving is about the size of one deck of cards.  Eat at least 5 servings of fresh fruits and vegetables each day. To do this: ? Keep fruits and vegetables on hand for snacks. ? Eat 1 piece of fruit or a handful of berries with breakfast. ? Have a salad and fruit at lunch. ? Have two kinds of vegetables at dinner.  Limit foods that are high in a substance called oxalate. These include: ? Spinach. ? Rhubarb. ? Beets. ? Potato chips and french fries. ? Nuts.  If you regularly take a diuretic medicine, make sure to eat at least 1-2 fruits or vegetables high in potassium each day. These include: ? Avocado. ? Banana. ? Orange, prune, carrot, or tomato juice. ? Baked potato. ? Cabbage. ? Beans and split   peas. General instructions   Drink enough fluid to keep your urine clear or pale yellow. This is the most important thing you can do.  Talk to your health care provider and dietitian about taking daily supplements. Depending on your health and the cause of your kidney stones, you may be advised: ? Not to take supplements with vitamin C. ? To take a calcium supplement. ? To take a daily probiotic supplement. ? To take other supplements such as magnesium, fish oil, or vitamin B6.  Take all medicines and supplements as told by your health care provider.  Limit alcohol intake to no  more than 1 drink a day for nonpregnant women and 2 drinks a day for men. One drink equals 12 oz of beer, 5 oz of wine, or 1 oz of hard liquor.  Lose weight if told by your health care provider. Work with your dietitian to find strategies and an eating plan that works best for you. What foods are not recommended? Limit your intake of the following foods, or as told by your dietitian. Talk to your dietitian about specific foods you should avoid based on the type of kidney stones and your overall health. Grains Breads. Bagels. Rolls. Baked goods. Salted crackers. Cereal. Pasta. Vegetables Spinach. Rhubarb. Beets. Canned vegetables. Pickles. Olives. Meats and other protein foods Nuts. Nut butters. Large portions of meat, poultry, or fish. Salted or cured meats. Deli meats. Hot dogs. Sausages. Dairy Cheese. Beverages Regular soft drinks. Regular vegetable juice. Seasonings and other foods Seasoning blends with salt. Salad dressings. Canned soups. Soy sauce. Ketchup. Barbecue sauce. Canned pasta sauce. Casseroles. Pizza. Lasagna. Frozen meals. Potato chips. French fries. Summary  You can reduce your risk of kidney stones by making changes to your diet.  The most important thing you can do is drink enough fluid. You should drink enough fluid to keep your urine clear or pale yellow.  Ask your health care provider or dietitian how much protein from animal sources you should eat each day, and also how much salt and calcium you should have each day. This information is not intended to replace advice given to you by your health care provider. Make sure you discuss any questions you have with your health care provider. Document Revised: 10/21/2018 Document Reviewed: 06/11/2016 Elsevier Patient Education  2020 [employer]  

## 2020-03-29 NOTE — Progress Notes (Signed)

## 2020-04-11 ENCOUNTER — Ambulatory Visit (INDEPENDENT_AMBULATORY_CARE_PROVIDER_SITE_OTHER): Payer: Medicare Other | Admitting: Gastroenterology

## 2020-04-11 ENCOUNTER — Encounter: Payer: Self-pay | Admitting: Gastroenterology

## 2020-04-11 ENCOUNTER — Other Ambulatory Visit: Payer: Self-pay

## 2020-04-11 VITALS — BP 109/56 | HR 81 | Temp 97.0°F | Ht 68.0 in | Wt 143.6 lb

## 2020-04-11 DIAGNOSIS — K861 Other chronic pancreatitis: Secondary | ICD-10-CM

## 2020-04-11 DIAGNOSIS — K8020 Calculus of gallbladder without cholecystitis without obstruction: Secondary | ICD-10-CM | POA: Diagnosis not present

## 2020-04-11 DIAGNOSIS — R9389 Abnormal findings on diagnostic imaging of other specified body structures: Secondary | ICD-10-CM | POA: Diagnosis not present

## 2020-04-11 NOTE — Progress Notes (Signed)
Primary Care Physician:  Colon Branch, MD  Primary Gastroenterologist:  Hennie Duos. Marletta Lor, DO   Chief Complaint  Patient presents with  . Cholelithiasis    HPI:  Joseph Bowen is a 74 y.o. male with history of PTSD, alcohol abuse and Wernicke's encephalopathy, COPD, dementia, chronic pancreatitis here at the request of Dr. Standley Dakins ([facility] hospitalist) for further evaluation of gallstones.  He is a long-term resident at [facility] skilled nursing facility. Patient was hospitalized back in July status changes, severe dehydration, UTI.    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 Limited assessment due to streak artifact from embolization coils. Cholelithiasis, nonobstructing bilateral nephrolithiasis, celiac artery with previous embolization with streak artifact limiting assessment.  Gross patency of splenic and hepatic arteries. SMA patent.  Labs from nursing facility dated 03/28/2020: A1c 8.9.  Labs from August 2021: White blood cell count 6000, hemoglobin 11.3, hematocrit 36.2, MCV 92, platelets 196,000, glucose 263, creatinine 2.02, BUN 28.  Spoke to [employer], patient's legal guardian who was in the office with another patient today.  Patient has been residing at [facility] skilled nursing facility for years.  He has been under their care as legal guardian since 2011.  Initially on intake, living in poor home conditions, weighed in at 98 pounds.  Gained back up into the 140 to 150 pound range but has had some slow gradual weight loss recently.  She states he has poor oral intake having been to the ED a couple of times due to dehydration.  Consumes coffee but not significant amount of free  water.  Patient will often deny any type of GI symptoms however at times reports to his sister that he has abdominal pain.  He has had some diarrhea.  She notes his sugars have been hard to control.  No reported vomiting.  No reported melena or rectal bleeding.    Current Outpatient Medications  Medication Sig Dispense Refill  . alum & mag hydroxide-simeth (MAALOX/MYLANTA) 200-200-20 MG/5ML suspension Take 30 mLs by mouth every 6 (six) hours as needed for indigestion or heartburn.    Marland Kitchen atorvastatin (LIPITOR) 40 MG tablet Take 40 mg by mouth daily.    . ferrous sulfate 325 (65 FE) MG tablet Take 325 mg by mouth daily with breakfast.    . fludrocortisone (FLORINEF) 0.1 MG tablet Take 1 tablet (0.1 mg total) by mouth daily. 30 tablet 6  . gabapentin (NEURONTIN) 100 MG capsule Take 200 mg by mouth 3 (three) times daily.     Marland Kitchen HUMALOG 100 UNIT/ML injection Inject 2-12 Units into the skin 4 (four) times daily. 151-200 2units,201-250 4units,251-300 6units, 301-350 8units,351-400 10units,401-450 12units notify MD if greader than 400    . hydrALAZINE (APRESOLINE) 25 MG tablet Take 1 tablet (25 mg total) by mouth 3 (three) times daily. HOLD FOR SBP<130 (Patient taking differently: Take 25 mg by mouth daily. HOLD FOR SBP<130)    . insulin glargine (LANTUS) 100 UNIT/ML injection Inject 0.12 mLs (12 Units total) into the skin daily. (Patient taking differently: Inject 15 Units into the skin daily. ) 10 mL 11  . levothyroxine (SYNTHROID) 137 MCG tablet Take 137 mcg by mouth daily.    . lipase/protease/amylase (CREON) 36000 UNITS CPEP capsule Take 1 capsule (36,000 Units total) by  mouth 3 (three) times daily with meals. 270 capsule 0  . loperamide (IMODIUM A-D) 2 MG tablet Take 2 mg by mouth as needed for diarrhea or loose stools.    Marland Kitchen loratadine (CLARITIN) 10 MG tablet Take 10 mg by mouth daily as needed for allergies.    Marland Kitchen LORazepam (ATIVAN) 0.5 MG tablet Take 1 tablet (0.5 mg total) by mouth 2 (two) times  daily. (Patient taking differently: Take 0.5 mg by mouth every 6 (six) hours as needed. ) 10 tablet 0  . memantine (NAMENDA) 5 MG tablet Take 5 mg by mouth 2 (two) times daily.    . metFORMIN (GLUCOPHAGE) 1000 MG tablet Take 1,000 mg by mouth 2 (two) times daily with a meal.    . mirtazapine (REMERON) 7.5 MG tablet Take 7.5 mg by mouth at bedtime.    Marland Kitchen OLANZapine zydis (ZYPREXA) 5 MG disintegrating tablet SMARTSIG:1 Tablet(s) By Mouth Every Evening    . ondansetron (ZOFRAN) 4 MG tablet Take 4 mg by mouth every 8 (eight) hours as needed for nausea or vomiting.    . sennosides-docusate sodium (SENOKOT-S) 8.6-50 MG tablet Take 1 tablet by mouth 2 (two) times daily.    . sertraline (ZOLOFT) 50 MG tablet Take 50 mg by mouth daily.     . sitaGLIPtin (JANUVIA) 100 MG tablet Take 100 mg by mouth daily.     Marland Kitchen thiamine 100 MG tablet Take 100 mg by mouth daily.    . traZODone (DESYREL) 50 MG tablet Take 50 mg by mouth at bedtime.     No current facility-administered medications for this visit.    Allergies as of 04/11/2020  . (No Known Allergies)    Past Medical History:  Diagnosis Date  . Alcohol-induced persisting amnestic disorder (HCC)   . Anemia   . Anxiety   . Atherosclerosis of arteries   . Benign prostatic hypertrophy   . Chronic pancreatitis (HCC)   . Constipation   . COPD (chronic obstructive pulmonary disease) (HCC)   . Dementia (HCC)   . Dementia (HCC)    due to encephalopathy from diabetic coma 2011.  . Depression   . Difficulty walking   . Dizziness and giddiness   . 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  . Essential hypertension   . Euthyroid sick syndrome   . Glaucoma   . H/O ETOH abuse   . History of falling   . Hydrocele   . Hypoglycemia   . Hypothyroidism   . Insomnia   . Kidney stone   . Legally blind   . Orthostatic hypotension   . Pain in left shoulder   . Personal history of alcoholism (HCC)   . Post traumatic stress disorder   . Psychosis  (HCC)   . Radiculopathy, cervical region   . Retinopathy   . Sleep terrors (night terrors)   . Syncope and collapse   . Type 2 diabetes mellitus (HCC)   . Urinary incontinence   . Vitamin B deficiency   . Vitamin D deficiency     Past Surgical History:  Procedure Laterality Date  . BACK SURGERY     removal of melanoma of back.  . COLONOSCOPY WITH PROPOFOL N/A 08/15/2015   Dr. Darrick Penna: left colon redundant, pediatric scope used, sigmoid colon woult not adequately insufflate, moderate diverticulosis throughout entire colon, moderate sized IH   . EXTRACORPOREAL SHOCK WAVE LITHOTRIPSY Right 11/18/2016   Procedure: RIGHT EXTRACORPOREAL SHOCK WAVE LITHOTRIPSY (ESWL);  Surgeon: Barron Alvine, MD;  Location: [facility];  Service: Urology;  Laterality: Right;  . HAND SURGERY Right    middle finger; fractue  . HEMORRHOID SURGERY    . HERNIA REPAIR Left    inguinal  . HIP SURGERY Bilateral    bilateral hip fractures  . MOHS SURGERY     Melanoma    Family History  Problem Relation Age of Onset  . Heart disease Other   . Diabetes Other     Social History   Socioeconomic History  . Marital status: Divorced    Spouse name: Not on file  . Number of children: Not on file  . Years of education: Not on file  . Highest education level: Not on file  Occupational History  . Occupation: retired  Tobacco Use  . Smoking status: Former Smoker    Packs/day: 1.50    Years: 48.00    Pack years: 72.00    Types: Cigarettes    Start date: 09/17/1961    Quit date: 09/17/2009    Years since quitting: 10.5  . Smokeless tobacco: Never Used  Substance and Sexual Activity  . Alcohol use: No    Alcohol/week: 0.0 standard drinks    Comment: hx of alcoholism-last used 2011  . Drug use: No  . Sexual activity: Never    Birth control/protection: None  Other Topics Concern  . Not on file  Social History Narrative  . Not on file   Social Determinants of Health   Financial Resource Strain:   . Difficulty  of Paying Living Expenses: Not on file  Food Insecurity:   . Worried About Programme researcher, broadcasting/film/videounning Out of Food in the Last Year: Not on file  . Ran Out of Food in the Last Year: Not on file  Transportation Needs:   . Lack of Transportation (Medical): Not on file  . Lack of Transportation (Non-Medical): Not on file  Physical Activity:   . Days of Exercise per Week: Not on file  . Minutes of Exercise per Session: Not on file  Stress:   . Feeling of Stress : Not on file  Social Connections:   . Frequency of Communication with Friends and Family: Not on file  . Frequency of Social Gatherings with Friends and Family: Not on file  . Attends Religious Services: Not on file  . Active Member of Clubs or Organizations: Not on file  . Attends BankerClub or Organization Meetings: Not on file  . Marital Status: Not on file  Intimate Partner Violence:   . Fear of Current or Ex-Partner: Not on file  . Emotionally Abused: Not on file  . Physically Abused: Not on file  . Sexually Abused: Not on file      ROS: could not obtain     Physical Examination:  BP (!) 109/56   Pulse 81   Temp (!) 97 F (36.1 C) (Oral)   Ht 5\' 8"  (1.727 m)   Wt 143 lb 9.6 oz (65.1 kg)   BMI 21.83 kg/m    General: Chronically ill-appearing male accompanied by transporter.  No acute distress.  Oriented to person only.  History unreliable. Head: Normocephalic, atraumatic.   Eyes: Conjunctiva pink, no icterus. Mouth: masked Neck: Supple without thyromegaly, masses, or lymphadenopathy.  Lungs: Clear to auscultation bilaterally.  Heart: Regular rate and rhythm, no murmurs rubs or gallops.  Abdomen: Bowel sounds are normal, nontender, nondistended, no hepatosplenomegaly or masses, no abdominal bruits or    hernia , no rebound or guarding.   Rectal: not performed  Extremities: No lower extremity edema. No clubbing or deformities.  Neuro: Alert and oriented x ! , grossly normal neurologically.  Skin: Warm and dry, no rash or jaundice.    Psych: Alert and cooperative, normal mood and flat affect.  Labs: Lab Results  Component Value Date   CREATININE 1.17 02/12/2020   BUN 28 (H) 02/12/2020   NA 138 02/12/2020   K 3.7 02/12/2020   CL 102 02/12/2020   CO2 26 02/12/2020   Lab Results  Component Value Date   ALT 21 02/12/2020   AST 22 02/12/2020   ALKPHOS 48 02/12/2020   BILITOT 0.7 02/12/2020   Lab Results  Component Value Date   WBC 5.7 02/12/2020   HGB 11.4 (L) 02/12/2020   HCT 36.5 (L) 02/12/2020   MCV 91.7 02/12/2020   PLT 208 02/12/2020   No results found for: IRON, TIBC, FERRITIN No results found for: VITAMINB12 No results found for: FOLATE    Imaging Studies: No results found.  Impression/plan: 74 year old male, resident of [facility], history of PTSD, alcohol abuse with Warnicke encephalopathy, COPD, dementia, chronic pancreatitis (dating back to at least 2006 at time of EUS at [facility]) presenting at the request of [facility] hospitalist Dr. Standley Dakins after patient's recent admission back in July.  Patient has a history of chronic pancreatitis noted on CT, cholelithiasis.  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  Unable to obtain history from patient.  According to his legal guardian, [name], there have been reports of intermittent abdominal pain and diarrhea, slow gradual weight loss.  I requested copy of weights from nursing home which showed him weighing in at 151 pounds in March, recently down to 145 pounds indicating mild weight loss.  Abdominal pain and weight loss can be noted in both chronic mesenteric ischemia, chronic pancreatitis.  I doubt his symptoms are related to his gallstones.  He has seen surgery who does not advise cholecystectomy at this time.  Patient is currently on Creon, however dose does not appear adequate for chronic pancreatitis.  We will give weight-based dosing, Creon  36,000 units 2 with meals and 1 with snacks.  We will look into his recent CTA, may benefit from seeing vascular or interventional radiology given previous embolization of the celiac axis, moderate to severe stenosis of the IMA.  Further recommendations to follow.

## 2020-04-11 NOTE — Patient Instructions (Signed)
1. Please fax copy of weights from the last 6 months to [phone].

## 2020-04-12 ENCOUNTER — Telehealth: Payer: Self-pay | Admitting: Gastroenterology

## 2020-04-12 ENCOUNTER — Encounter: Payer: Self-pay | Admitting: Gastroenterology

## 2020-04-12 DIAGNOSIS — K861 Other chronic pancreatitis: Secondary | ICD-10-CM

## 2020-04-12 MED ORDER — PANCRELIPASE (LIP-PROT-AMYL) 36000-114000 UNITS PO CPEP: ORAL_CAPSULE | ORAL | 5 refills | Status: AC

## 2020-04-12 NOTE — Addendum Note (Signed)
Addended by: Armstead Peaks on: 04/12/2020 03:45 PM   Modules accepted: Orders

## 2020-04-12 NOTE — Telephone Encounter (Signed)
Spoke with Aurea Graff at [facility]. She was notified of Creon Dosage and instructions. Orders for Creon and ov notes were faxed to Aurea Graff and PCP Colon Branch, MD.

## 2020-04-12 NOTE — Telephone Encounter (Signed)
Referral sent to [facility] in [place]

## 2020-04-12 NOTE — Telephone Encounter (Signed)
Please let nursing home know that I want to increase his Creon to 36000 units, take 2 capsule with meals and 1 capsule with snacks. I have sent in RX.   Please send copy of my ov note to [facility] and his PCP.   Please referral to vein and vascular for possible chronic mesenteric ischemia

## 2020-04-12 NOTE — Telephone Encounter (Signed)
Joseph Bowen was also notified of referral sent for possible chronic mesenteric ischemia.

## 2020-04-24 ENCOUNTER — Telehealth: Payer: Self-pay

## 2020-05-26 NOTE — Telephone Encounter (Signed)
Encounter opened in error

## 2020-06-14 LAB — HEMOGLOBIN A1C: Hemoglobin A1C: 10.2

## 2020-06-16 LAB — VITAMIN D 25 HYDROXY (VIT D DEFICIENCY, FRACTURES): Vit D, 25-Hydroxy: 41.9

## 2020-07-13 ENCOUNTER — Other Ambulatory Visit: Payer: Self-pay

## 2020-07-13 ENCOUNTER — Ambulatory Visit (INDEPENDENT_AMBULATORY_CARE_PROVIDER_SITE_OTHER): Payer: Medicare Other | Admitting: "Endocrinology

## 2020-07-13 ENCOUNTER — Encounter: Payer: Self-pay | Admitting: "Endocrinology

## 2020-07-13 VITALS — BP 120/68 | HR 76 | Ht 68.0 in | Wt 146.8 lb

## 2020-07-13 DIAGNOSIS — K8681 Exocrine pancreatic insufficiency: Secondary | ICD-10-CM | POA: Diagnosis not present

## 2020-07-13 DIAGNOSIS — E089 Diabetes mellitus due to underlying condition without complications: Secondary | ICD-10-CM

## 2020-07-13 DIAGNOSIS — E039 Hypothyroidism, unspecified: Secondary | ICD-10-CM

## 2020-07-13 DIAGNOSIS — K8689 Other specified diseases of pancreas: Secondary | ICD-10-CM

## 2020-07-13 MED ORDER — INSULIN GLARGINE 100 UNIT/ML ~~LOC~~ SOLN: 30.0000 [IU] | Freq: Every day | SUBCUTANEOUS | 2 refills | Status: DC

## 2020-07-13 NOTE — Progress Notes (Signed)
Endocrinology Consult Note       07/13/2020, 11:22 AM   Subjective:    Patient ID: Joseph Bowen, male    DOB: 02-12-1946.  Joseph Bowen is being seen in consultation for management of currently uncontrolled symptomatic diabetes requested by  Colon Branch, MD. Patient was accompanied by his power of attorney [name] and nursing home aide.  Past Medical History:  Diagnosis Date  . Alcohol-induced persisting amnestic disorder (HCC)   . Anemia   . Anxiety   . Atherosclerosis of arteries   . Benign prostatic hypertrophy   . Chronic pancreatitis (HCC)   . Constipation   . COPD (chronic obstructive pulmonary disease) (HCC)   . Dementia (HCC)   . Dementia (HCC)    due to encephalopathy from diabetic coma 2011.  . Depression   . Difficulty walking   . Dizziness and giddiness   . 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  . Essential hypertension   . Euthyroid sick syndrome   . Glaucoma   . H/O ETOH abuse   . History of falling   . Hydrocele   . Hypoglycemia   . Hypothyroidism   . Insomnia   . Kidney stone   . Legally blind   . Orthostatic hypotension   . Pain in left shoulder   . Personal history of alcoholism (HCC)   . Post traumatic stress disorder   . Psychosis (HCC)   . Radiculopathy, cervical region   . Retinopathy   . Sleep terrors (night terrors)   . Syncope and collapse   . Type 2 diabetes mellitus (HCC)   . Urinary incontinence   . Vitamin B deficiency   . Vitamin D deficiency     Past Surgical History:  Procedure Laterality Date  . BACK SURGERY     removal of melanoma of back.  . 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 COLONOSCOPY WITH PROPOFOL N/A 08/15/2015   Dr. Darrick Penna: left colon redundant, pediatric scope used, sigmoid colon woult not adequately insufflate, moderate diverticulosis throughout entire colon,  moderate sized IH   . EXTRACORPOREAL SHOCK WAVE LITHOTRIPSY Right 11/18/2016   Procedure: RIGHT EXTRACORPOREAL SHOCK WAVE LITHOTRIPSY (ESWL);  Surgeon: Barron Alvine, MD;  Location: [facility];  Service: Urology;  Laterality: Right;  . HAND SURGERY Right    middle finger; fractue  . HEMORRHOID SURGERY    . HERNIA REPAIR Left    inguinal  . HIP SURGERY Bilateral    bilateral hip fractures  . MOHS SURGERY     Melanoma    Social History   Socioeconomic History  . Marital status: Divorced    Spouse name: Not on file  . Number of children: Not on file  . Years of education: Not on file  . Highest education level: Not on file  Occupational History  . Occupation: retired  Tobacco Use  . Smoking status: Former Smoker    Packs/day: 1.50    Years: 48.00    Pack years: 72.00    Types: Cigarettes    Start date: 09/17/1961  Quit date: 09/17/2009    Years since quitting: 10.8  . Smokeless tobacco: Never Used  Substance and Sexual Activity  . Alcohol use: No    Alcohol/week: 0.0 standard drinks    Comment: hx of alcoholism-last used 2011  . Drug use: No  . Sexual activity: Never    Birth control/protection: None  Other Topics Concern  . Not on file  Social History Narrative  . Not on file   Social Determinants of Health   Financial Resource Strain: Not on file  Food Insecurity: Not on file  Transportation Needs: Not on file  Physical Activity: Not on file  Stress: Not on file  Social Connections: Not on file    Family History  Problem Relation Age of Onset  . Heart disease Other   . Diabetes Other     Outpatient Encounter Medications as of 07/13/2020  Medication Sig  . Cholecalciferol (VITAMIN D3) 1.25 MG (50000 UT) TABS Take 1 tablet by mouth once a week.  . melatonin 5 MG TABS Take 5 mg by mouth at bedtime.  . midodrine (PROAMATINE) 5 MG tablet Take 5 mg by mouth daily as needed.  Bertram Gala Glycol-Propyl Glycol (SYSTANE) 0.4-0.3 % SOLN Apply 1 drop to eye 2 (two) times  daily.  . prazosin (MINIPRESS) 1 MG capsule Take 1 mg by mouth at bedtime.  Marland Kitchen alum & mag hydroxide-simeth (MAALOX/MYLANTA) 200-200-20 MG/5ML suspension Take 30 mLs by mouth every 6 (six) hours as needed for indigestion or heartburn.  Marland Kitchen atorvastatin (LIPITOR) 40 MG tablet Take 40 mg by mouth daily.  . ferrous sulfate 325 (65 FE) MG tablet Take 325 mg by mouth daily with breakfast.  . fludrocortisone (FLORINEF) 0.1 MG tablet Take 1 tablet (0.1 mg total) by mouth daily.  Marland Kitchen gabapentin (NEURONTIN) 100 MG capsule Take 200 mg by mouth 3 (three) times daily.   Marland Kitchen HUMALOG 100 UNIT/ML injection Inject 5-8 Units into the skin 3 (three) times daily before meals.  . hydrALAZINE (APRESOLINE) 25 MG tablet Take 1 tablet (25 mg total) by mouth 3 (three) times daily. HOLD FOR SBP<130 (Patient taking differently: Take 25 mg by mouth daily. HOLD FOR SBP<130)  . insulin glargine (LANTUS) 100 UNIT/ML injection Inject 0.3 mLs (30 Units total) into the skin daily.  Marland Kitchen levothyroxine (SYNTHROID) 137 MCG tablet Take 137 mcg by mouth daily.  . lipase/protease/amylase (CREON) 36000 UNITS CPEP capsule Take 2 capsule with meals and 1 capsule with snacks total of 8 daily.  Marland Kitchen loperamide (IMODIUM A-D) 2 MG tablet Take 2 mg by mouth as needed for diarrhea or loose stools.  Marland Kitchen loratadine (CLARITIN) 10 MG tablet Take 10 mg by mouth daily as needed for allergies.  Marland Kitchen LORazepam (ATIVAN) 0.5 MG tablet Take 1 tablet (0.5 mg total) by mouth 2 (two) times daily. (Patient taking differently: Take 0.5 mg by mouth every 6 (six) hours as needed. )  . memantine (NAMENDA) 5 MG tablet Take 5 mg by mouth 2 (two) times daily.  . mirtazapine (REMERON) 7.5 MG tablet Take 7.5 mg by mouth at bedtime.  Marland Kitchen OLANZapine zydis (ZYPREXA) 5 MG disintegrating tablet SMARTSIG:1 Tablet(s) By Mouth Every Evening  . ondansetron (ZOFRAN) 4 MG tablet Take 4 mg by mouth every 8 (eight) hours as needed for nausea or vomiting.  . sennosides-docusate sodium (SENOKOT-S) 8.6-50  MG tablet Take 1 tablet by mouth 2 (two) times daily.  . sertraline (ZOLOFT) 50 MG tablet Take 50 mg by mouth daily.   Marland Kitchen thiamine 100 MG  tablet Take 100 mg by mouth daily.  . traZODone (DESYREL) 50 MG tablet Take 50 mg by mouth at bedtime.  . [DISCONTINUED] insulin glargine (LANTUS) 100 UNIT/ML injection Inject 0.12 mLs (12 Units total) into the skin daily. (Patient taking differently: Inject 28 Units into the skin daily.)  . [DISCONTINUED] metFORMIN (GLUCOPHAGE) 1000 MG tablet Take 1,000 mg by mouth 2 (two) times daily with a meal.  . [DISCONTINUED] sitaGLIPtin (JANUVIA) 100 MG tablet Take 100 mg by mouth daily.    No facility-administered encounter medications on file as of 07/13/2020.    ALLERGIES: No Known Allergies  VACCINATION STATUS:  There is no immunization history on file for this patient.  Diabetes He presents for his initial diabetic visit. Diabetes type: His diabetes is likely induced by pancreatic insufficiency related to alcohol history. Onset time: He stayed in [facility] home for the last 11 years, came with a diagnosis of diabetes. His disease course has been fluctuating. There are no hypoglycemic associated symptoms. Pertinent negatives for hypoglycemia include no confusion, headaches, pallor or seizures. There are no diabetic associated symptoms. Pertinent negatives for diabetes include no chest pain, no fatigue, no polydipsia, no polyphagia, no polyuria and no weakness. There are no hypoglycemic complications. Symptoms are worsening. Risk factors for coronary artery disease include diabetes mellitus, dyslipidemia, hypertension, tobacco exposure, male sex and sedentary lifestyle (His diabetes is complicated by pancreatic insufficiency, history of encephalopathy, retinopathy, autonomic dysfunction with disequilibrium.). Current diabetic treatments: Is currently on Metformin 1000 mg p.o. twice daily, Januvia 100 mg p.o. daily, Lantus 28 units nightly, Humalog 2-12 units  4 times a day.  Compliance with diabetes treatment: He is 100% dependent on others for care. His weight is fluctuating minimally (Patient does not remember how much his routine weight has been.  However, he was found to be under 100 pounds during admission to nursing home.  He is currently at 146 pounds.). He is following a generally unhealthy diet. He has not had a previous visit with a dietitian. He never participates in exercise. Blood glucose monitoring compliance is adequate. His breakfast blood glucose range is generally >200 mg/dl. His lunch blood glucose range is generally >200 mg/dl. His dinner blood glucose range is generally >200 mg/dl. His bedtime blood glucose range is generally >200 mg/dl. His overall blood glucose range is >200 mg/dl. (With the exception of rare, random hypoglycemia, he mostly runs very high blood glucose readings.  His previsit labs show A1c of 10.2% on June 14, 2020.) An ACE inhibitor/angiotensin II receptor blocker is not being taken. Eye exam is current.  Thyroid Problem Presents for initial visit. Onset time: Unknown duration and circumstance of diagnosis with hypothyroidism. Patient reports no constipation, diarrhea, fatigue or palpitations. Past treatments include levothyroxine.     Review of Systems  Constitutional: Negative for chills, fatigue, fever and unexpected weight change.  HENT: Negative for dental problem, mouth sores and trouble swallowing.   Eyes: Negative for visual disturbance.  Respiratory: Negative for cough, choking, chest tightness, shortness of breath and wheezing.   Cardiovascular: Negative for chest pain, palpitations and leg swelling.  Gastrointestinal: Negative for abdominal distention, abdominal pain, constipation, diarrhea, nausea and vomiting.  Endocrine: Negative for polydipsia, polyphagia and polyuria.  Genitourinary: Negative for dysuria, flank pain, hematuria and urgency.  Musculoskeletal: Positive for gait problem. Negative for  back pain, myalgias and neck pain.       Patient is known to have disequilibrium, syncope/presyncope currently on fludrocortisone, midodrine.  Skin: Negative for pallor, rash  and wound.  Neurological: Negative for seizures, syncope, weakness, numbness and headaches.  Psychiatric/Behavioral: Negative for confusion and dysphoric mood.    Objective:    Vitals with BMI 07/13/2020 04/11/2020 03/29/2020  Height 5\' 8"  5\' 8"  5\' 8"   Weight 146 lbs 13 oz 143 lbs 10 oz 145 lbs  BMI 22.33 21.84 22.05  Systolic 120 109 96  Diastolic 68 56 58  Pulse 76 81 97    BP 120/68   Pulse 76   Ht 5\' 8"  (1.727 m)   Wt 146 lb 12.8 oz (66.6 kg)   BMI 22.32 kg/m   Wt Readings from Last 3 Encounters:  07/13/20 146 lb 12.8 oz (66.6 kg)  04/11/20 143 lb 9.6 oz (65.1 kg)  03/29/20 145 lb (65.8 kg)     Physical Exam Constitutional:      General: He is not in acute distress.    Appearance: He is well-developed and well-nourished.  HENT:     Head: Normocephalic and atraumatic.  Eyes:     Extraocular Movements: EOM normal.  Neck:     Thyroid: No thyromegaly.     Trachea: No tracheal deviation.  Cardiovascular:     Rate and Rhythm: Normal rate.     Pulses:          Dorsalis pedis pulses are 1+ on the right side and 1+ on the left side.       Posterior tibial pulses are 1+ on the right side and 1+ on the left side.     Heart sounds: Normal heart sounds, S1 normal and S2 normal. No murmur heard. No gallop.   Pulmonary:     Effort: Pulmonary effort is normal. No respiratory distress.     Breath sounds: Normal breath sounds. No wheezing.  Abdominal:     General: Abdomen is flat. There is no distension.     Palpations: Abdomen is soft.     Tenderness: There is no abdominal tenderness. There is no CVA tenderness or guarding.  Musculoskeletal:        General: No edema.     Right shoulder: No swelling or deformity.     Cervical back: Normal range of motion and neck supple.  Skin:    General: Skin is  warm and dry.     Findings: No rash.     Nails: There is no clubbing or cyanosis.  Neurological:     Mental Status: He is alert and oriented to person, place, and time.     Cranial Nerves: No cranial nerve deficit.     Sensory: No sensory deficit.     Gait: Gait normal.     Deep Tendon Reflexes: Strength normal and reflexes are normal and symmetric.  Psychiatric:        Mood and Affect: Mood and affect normal.        Speech: Speech normal.        Behavior: Behavior normal. Behavior is cooperative.        Thought Content: Thought content normal.        Cognition and Memory: Cognition and memory normal.        Judgment: Judgment normal.     Comments: Patient has significant cognitive deficit.  He is accompanied by his power of attorney and nursing home aide.  He answers simple questions.     CMP ( most recent) CMP     Component Value Date/Time   NA 138 02/12/2020 2215   K 3.7 02/12/2020 2215   CL  102 02/12/2020 2215   CO2 26 02/12/2020 2215   GLUCOSE 110 (H) 02/12/2020 2215   BUN 28 (H) 02/12/2020 2215   CREATININE 1.17 02/12/2020 2215   CALCIUM 9.2 02/12/2020 2215   PROT 6.3 (L) 02/12/2020 2215   ALBUMIN 3.9 02/12/2020 2215   AST 22 02/12/2020 2215   ALT 21 02/12/2020 2215   ALKPHOS 48 02/12/2020 2215   BILITOT 0.7 02/12/2020 2215   GFRNONAA >60 02/12/2020 2215   GFRAA >60 02/12/2020 2215     Diabetic Labs (most recent): Lab Results  Component Value Date   HGBA1C 10.2 06/14/2020   HGBA1C 8.9 (H) 02/02/2020   HGBA1C 9.1 (H) 07/29/2017    Assessment & Plan:   1. 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  Patient resides in the [facility] nursing home for the last 11 years.  He has a past history of heavy alcohol use which likely triggered the diabetes and exocrine pancreatic insufficiency.   His recent A1c was 10.2%.  He is accompanied by his  power of attorney and nursing home aide.  Recent labs reviewed. - I had a long discussion with him and his company about the  nature of his diabetes and the pathology behind its complications. -He is at risk of hypoglycemia due to lack of endogenous glucagon response from destruction of the alpha cells in addition to the beta cells. 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 These are all discussed in detail with him.  -He would benefit from more introduction of unprocessed or complex starch in lieu of   processed carbohydrates such as soda, sweet tea, and ice cream.   - I have approached him with the following individualized plan to manage  his diabetes and patient agrees:   -He has very limited options to treat his diabetes.  He is not a candidate for Metformin, or incretin therapy. -He will continue to need intensive treatment with basal/bolus insulin in order for him to achieve and maintain control of diabetes to target. -Accordingly, I advised to increase Lantus to 30 units nightly, adjust Humalog to 5  units 3 times a day with meals  for pre-meal BG readings of 90-150mg /dl, plus patient specific correction dose for unexpected hyperglycemia above /dl, associated with strict monitoring of glucose 4 times a day-before meals and at bedtime.  - he is warned not to take insulin without proper monitoring per orders. - Adjustment parameters are given to him for hypo and hyperglycemia in writing. - he is encouraged to call clinic for blood glucose levels less than 70 or above 300 mg /dl. -I advised discontinuation of his Metformin and Januvia.  - Specific targets for  A1c;  LDL, HDL,  and Triglycerides were discussed with the patient.  2) Blood Pressure /Hypertension:  his blood pressure  is  controlled to target.  Evidently, he did not tolerate blood pressure medications.  He has a tendency for presyncope/syncope, currently on midodrine and fludrocortisone.  He will be evaluated for adrenal insufficiency on subsequent visits.   3) Lipids/Hyperlipidemia: No recent lipid panel to review.  He is currently on ongoing treatment with Lipitor 40 mg p.o. nightly.  Advised to continue.     4)  Weight/Diet:  Body mass index is  22.32 kg/m.  -  he is not a candidate for weight loss.  He walks with a cane, fall risk is high.  Precautions discussed with him.   5) hypothyroidism: Duration and circumstance of diagnosis are not available to review.  No recent thyroid function test to review.  He is currently on levothyroxine 137 mcg p.o. every morning.  He will be considered for thyroid function test along with his subsequent labs.  6) exocrine pancreatic insufficiency -Likely related to his past history of heavy alcohol consumption. He is currently on Creon 36,000 units- - 2 capsules with meals and 1 capsule with snacks.  He is advised to continue.  7) Chronic Care/Health Maintenance:  -he  Is on  Statin medications and  is encouraged to initiate and continue to follow up with Ophthalmology, Dentist,  Podiatrist at least yearly or according to recommendations, and advised to   stay away from smoking. I have recommended yearly flu vaccine and pneumonia vaccine at least every 5 years; moderate intensity exercise for up to 150 minutes weekly; and  sleep for at least 7 hours a day.  - he is  advised to maintain close follow up with Colon Branch, MD for primary care needs, as well as his other providers for optimal and coordinated care.   - Time spent in this patient care: 60 min, of which > 50% was spent in  counseling  him about his pancreatic diabetes, hypothyroidism, exocrine pancreatic insufficiency and the rest reviewing his blood glucose logs , discussing his hypoglycemia and  hyperglycemia episodes, reviewing his current and  previous labs / studies  ( including abstraction from other facilities) and medications  doses and developing a  long term treatment plan based on the latest standards of care/ guidelines; and documenting his care.    Please refer to Patient Instructions for Blood Glucose Monitoring and Insulin/Medications Dosing Guide"  in media tab for additional information. Please  also refer to " Patient Self Inventory" in the Media  tab for reviewed elements of pertinent patient history.  Joseph Bowen and his power of attorney and nursing  Home aide  participated in the discussions, expressed understanding, and voiced agreement with the above plans.  All questions were answered to his satisfaction. he is encouraged to contact clinic should he have any questions or concerns prior to his return visit.   Follow up plan: - Return in about 10 days (around 07/23/2020) for F/U with Meter and Logs Only - no Labs.  Marquis Lunch, MD [facility] [address] [place], [place] [address] Phone: [phone]  Fax: [phone]    07/13/2020, 11:22 AM  This note was partially dictated with voice recognition software. Similar sounding words can be transcribed inadequately or may not  be corrected upon review.

## 2020-07-24 ENCOUNTER — Ambulatory Visit (INDEPENDENT_AMBULATORY_CARE_PROVIDER_SITE_OTHER): Payer: Medicare Other | Admitting: "Endocrinology

## 2020-07-24 ENCOUNTER — Encounter: Payer: Self-pay | Admitting: "Endocrinology

## 2020-07-24 ENCOUNTER — Other Ambulatory Visit: Payer: Self-pay

## 2020-07-24 VITALS — BP 139/89 | HR 66 | Ht 68.0 in | Wt 144.0 lb

## 2020-07-24 DIAGNOSIS — K8689 Other specified diseases of pancreas: Secondary | ICD-10-CM

## 2020-07-24 DIAGNOSIS — K8681 Exocrine pancreatic insufficiency: Secondary | ICD-10-CM

## 2020-07-24 DIAGNOSIS — E039 Hypothyroidism, unspecified: Secondary | ICD-10-CM | POA: Diagnosis not present

## 2020-07-24 DIAGNOSIS — E089 Diabetes mellitus due to underlying condition without complications: Secondary | ICD-10-CM

## 2020-07-24 NOTE — Progress Notes (Signed)
07/24/2020, 11:52 AM  Endocrinology follow-up note   Subjective:    Patient ID: Joseph Bowen, male    DOB: 1945-10-10.  Joseph Bowen is being seen in follow-up after he was seen in consultation for management of currently uncontrolled symptomatic diabetes requested by  Colon Branch, MD. Patient was accompanied by his power of attorney [name] and nursing home aide.  Past Medical History:  Diagnosis Date  . Alcohol-induced persisting amnestic disorder (HCC)   . Anemia   . Anxiety   . Atherosclerosis of arteries   . Benign prostatic hypertrophy   . Chronic pancreatitis (HCC)   . Constipation   . COPD (chronic obstructive pulmonary disease) (HCC)   . Dementia (HCC)   . Dementia (HCC)    due to encephalopathy from diabetic coma 2011.  . Depression   . Difficulty walking   . Dizziness and giddiness   . 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  . Essential hypertension   . Euthyroid sick syndrome   . Glaucoma   . H/O ETOH abuse   . History of falling   . Hydrocele   . Hypoglycemia   . Hypothyroidism   . Insomnia   . Kidney stone   . Legally blind   . Orthostatic hypotension   . Pain in left shoulder   . Personal history of alcoholism (HCC)   . Post traumatic stress disorder   . Psychosis (HCC)   . Radiculopathy, cervical region   . Retinopathy   . Sleep terrors (night terrors)   . Syncope and collapse   . Type 2 diabetes mellitus (HCC)   . Urinary incontinence   . Vitamin B deficiency   . Vitamin D deficiency     Past Surgical History:  Procedure Laterality Date  . BACK SURGERY     removal of melanoma of back.  . 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 COLONOSCOPY WITH PROPOFOL N/A 08/15/2015   Dr. Darrick Penna: left colon redundant, pediatric scope used, sigmoid colon woult not adequately insufflate, moderate diverticulosis throughout entire colon, moderate sized IH   .  EXTRACORPOREAL SHOCK WAVE LITHOTRIPSY Right 11/18/2016   Procedure: RIGHT EXTRACORPOREAL SHOCK WAVE LITHOTRIPSY (ESWL);  Surgeon: Barron Alvine, MD;  Location: [facility];  Service: Urology;  Laterality: Right;  . HAND SURGERY Right    middle finger; fractue  . HEMORRHOID SURGERY    . HERNIA REPAIR Left    inguinal  . HIP SURGERY Bilateral    bilateral hip fractures  . MOHS SURGERY     Melanoma    Social History   Socioeconomic History  . Marital status: Divorced    Spouse name: Not on file  . Number of children: Not on file  . Years of education: Not on file  . Highest education level: Not on file  Occupational History  . Occupation: retired  Tobacco Use  . Smoking status: Former Smoker    Packs/day: 1.50    Years: 48.00    Pack years: 72.00    Types: Cigarettes    Start date: 09/17/1961    Quit date: 09/17/2009    Years since quitting: 10.8  . Smokeless tobacco: Never Used  Substance and Sexual Activity  . Alcohol use: No    Alcohol/week: 0.0 standard drinks    Comment: hx of alcoholism-last used 2011  . Drug use: No  . Sexual activity: Never  Birth control/protection: None  Other Topics Concern  . Not on file  Social History Narrative  . Not on file   Social Determinants of Health   Financial Resource Strain: Not on file  Food Insecurity: Not on file  Transportation Needs: Not on file  Physical Activity: Not on file  Stress: Not on file  Social Connections: Not on file    Family History  Problem Relation Age of Onset  . Heart disease Other   . Diabetes Other     Outpatient Encounter Medications as of 07/24/2020  Medication Sig  . alum & mag hydroxide-simeth (MAALOX/MYLANTA) 200-200-20 MG/5ML suspension Take 30 mLs by mouth every 6 (six) hours as needed for indigestion or heartburn.  Marland Kitchen atorvastatin (LIPITOR) 40 MG tablet Take 40 mg by mouth daily.  . Cholecalciferol (VITAMIN D3) 1.25 MG (50000 UT) TABS Take 1 tablet by mouth once a week.  . ferrous sulfate  325 (65 FE) MG tablet Take 325 mg by mouth daily with breakfast.  . fludrocortisone (FLORINEF) 0.1 MG tablet Take 1 tablet (0.1 mg total) by mouth daily.  Marland Kitchen gabapentin (NEURONTIN) 100 MG capsule Take 200 mg by mouth 3 (three) times daily.   Marland Kitchen HUMALOG 100 UNIT/ML injection Inject 5-8 Units into the skin 3 (three) times daily before meals.  . hydrALAZINE (APRESOLINE) 25 MG tablet Take 1 tablet (25 mg total) by mouth 3 (three) times daily. HOLD FOR SBP<130 (Patient taking differently: Take 25 mg by mouth daily. HOLD FOR SBP<130)  . insulin glargine (LANTUS) 100 UNIT/ML injection Inject 0.3 mLs (30 Units total) into the skin daily.  Marland Kitchen levothyroxine (SYNTHROID) 137 MCG tablet Take 137 mcg by mouth daily.  . lipase/protease/amylase (CREON) 36000 UNITS CPEP capsule Take 2 capsule with meals and 1 capsule with snacks total of 8 daily.  Marland Kitchen loperamide (IMODIUM A-D) 2 MG tablet Take 2 mg by mouth as needed for diarrhea or loose stools.  Marland Kitchen loratadine (CLARITIN) 10 MG tablet Take 10 mg by mouth daily as needed for allergies.  Marland Kitchen LORazepam (ATIVAN) 0.5 MG tablet Take 1 tablet (0.5 mg total) by mouth 2 (two) times daily. (Patient taking differently: Take 0.5 mg by mouth every 6 (six) hours as needed. )  . melatonin 5 MG TABS Take 5 mg by mouth at bedtime.  . memantine (NAMENDA) 5 MG tablet Take 5 mg by mouth 2 (two) times daily.  . midodrine (PROAMATINE) 5 MG tablet Take 5 mg by mouth daily as needed.  . mirtazapine (REMERON) 7.5 MG tablet Take 7.5 mg by mouth at bedtime.  Marland Kitchen OLANZapine zydis (ZYPREXA) 5 MG disintegrating tablet SMARTSIG:1 Tablet(s) By Mouth Every Evening  . ondansetron (ZOFRAN) 4 MG tablet Take 4 mg by mouth every 8 (eight) hours as needed for nausea or vomiting.  Bertram Gala Glycol-Propyl Glycol (SYSTANE) 0.4-0.3 % SOLN Apply 1 drop to eye 2 (two) times daily.  . prazosin (MINIPRESS) 1 MG capsule Take 1 mg by mouth at bedtime.  . sennosides-docusate sodium (SENOKOT-S) 8.6-50 MG tablet Take 1  tablet by mouth 2 (two) times daily.  . sertraline (ZOLOFT) 50 MG tablet Take 50 mg by mouth daily.   Marland Kitchen thiamine 100 MG tablet Take 100 mg by mouth daily.  . traZODone (DESYREL) 50 MG tablet Take 50 mg by mouth at bedtime.   No facility-administered encounter medications on file as of 07/24/2020.    ALLERGIES: No Known Allergies  VACCINATION STATUS:  There is no immunization history on file for this patient.  Diabetes  He presents for his follow-up diabetic visit. Diabetes type: His diabetes is likely induced by pancreatic insufficiency related to alcohol history. Onset time: He stayed in [facility] nursing home for the last 11 years, came with a diagnosis of diabetes. His disease course has been fluctuating. There are no hypoglycemic associated symptoms. Pertinent negatives for hypoglycemia include no confusion, headaches, pallor or seizures. There are no diabetic associated symptoms. Pertinent negatives for diabetes include no chest pain, no fatigue, no polydipsia, no polyphagia, no polyuria and no weakness. There are no hypoglycemic complications. Symptoms are improving. Risk factors for coronary artery disease include diabetes mellitus, dyslipidemia, hypertension, tobacco exposure, male sex and sedentary lifestyle (His diabetes is complicated by pancreatic insufficiency, history of encephalopathy, retinopathy, autonomic dysfunction with disequilibrium.). Current diabetic treatments: Is currently on Metformin 1000 mg p.o. twice daily, Januvia 100 mg p.o. daily, Lantus 28 units nightly, Humalog 2-12 units 4 times a day.  Compliance with diabetes treatment: He is 100% dependent on others for care. His weight is stable (Patient does not remember how much his routine weight has been.  However, he was found to be under 100 pounds during admission to nursing home.  He is currently at 146 pounds.). He is following a generally unhealthy diet. He has not had a previous visit with a dietitian. He never  participates in exercise. Blood glucose monitoring compliance is adequate. His breakfast blood glucose range is generally 140-180 mg/dl. His lunch blood glucose range is generally >200 mg/dl. His dinner blood glucose range is generally >200 mg/dl. His bedtime blood glucose range is generally >200 mg/dl. His overall blood glucose range is >200 mg/dl. (He presents with his logs showing near target fasting glycemic profile, significantly above target postprandial glycemic profile his recent A1c was 10.2% in December 2021.  No major hypoglycemia was documented or reported.   ) An ACE inhibitor/angiotensin II receptor blocker is not being taken. Eye exam is current.  Thyroid Problem Presents for initial visit. Onset time: Unknown duration and circumstance of diagnosis with hypothyroidism. Patient reports no constipation, diarrhea, fatigue or palpitations. Past treatments include levothyroxine.     Review of Systems  Constitutional: Negative for chills, fatigue, fever and unexpected weight change.  HENT: Negative for dental problem, mouth sores and trouble swallowing.   Eyes: Negative for visual disturbance.  Respiratory: Negative for cough, choking, chest tightness, shortness of breath and wheezing.   Cardiovascular: Negative for chest pain, palpitations and leg swelling.  Gastrointestinal: Negative for abdominal distention, abdominal pain, constipation, diarrhea, nausea and vomiting.  Endocrine: Negative for polydipsia, polyphagia and polyuria.  Genitourinary: Negative for dysuria, flank pain, hematuria and urgency.  Musculoskeletal: Positive for gait problem. Negative for back pain, myalgias and neck pain.       Patient is known to have disequilibrium, syncope/presyncope currently on fludrocortisone, midodrine.  Skin: Negative for pallor, rash and wound.  Neurological: Negative for seizures, syncope, weakness, numbness and headaches.  Psychiatric/Behavioral: Negative for confusion and dysphoric  mood.    Objective:    Vitals with BMI 07/24/2020 07/13/2020 04/11/2020  Height 5\' 8"  5\' 8"  5\' 8"   Weight 144 lbs 146 lbs 13 oz 143 lbs 10 oz  BMI 21.9 22.33 21.84  Systolic 139 120 119  Diastolic 89 68 56  Pulse 66 76 81    BP 139/89   Pulse 66   Ht 5\' 8"  (1.727 m)   Wt 144 lb (65.3 kg)   BMI 21.90 kg/m   Wt Readings from Last 3 Encounters:  07/24/20  144 lb (65.3 kg)  07/13/20 146 lb 12.8 oz (66.6 kg)  04/11/20 143 lb 9.6 oz (65.1 kg)     Physical Exam Constitutional:      General: He is not in acute distress.    Appearance: He is well-developed.  HENT:     Head: Normocephalic and atraumatic.  Neck:     Thyroid: No thyromegaly.     Trachea: No tracheal deviation.  Cardiovascular:     Rate and Rhythm: Normal rate.     Pulses:          Dorsalis pedis pulses are 1+ on the right side and 1+ on the left side.       Posterior tibial pulses are 1+ on the right side and 1+ on the left side.     Heart sounds: Normal heart sounds, S1 normal and S2 normal. No murmur heard. No gallop.   Pulmonary:     Effort: Pulmonary effort is normal. No respiratory distress.     Breath sounds: Normal breath sounds. No wheezing.  Abdominal:     General: Abdomen is flat. There is no distension.     Palpations: Abdomen is soft.     Tenderness: There is no abdominal tenderness. There is no guarding.  Musculoskeletal:     Right shoulder: No swelling or deformity.     Cervical back: Normal range of motion and neck supple.  Skin:    General: Skin is warm and dry.     Findings: No rash.     Nails: There is no clubbing.  Neurological:     Mental Status: He is alert and oriented to person, place, and time.     Cranial Nerves: No cranial nerve deficit.     Sensory: No sensory deficit.     Gait: Gait normal.     Deep Tendon Reflexes: Reflexes are normal and symmetric.  Psychiatric:        Speech: Speech normal.        Behavior: Behavior normal. Behavior is cooperative.        Thought  Content: Thought content normal.        Judgment: Judgment normal.     Comments: Patient has significant cognitive deficit.  He is accompanied by his power of attorney and nursing home aide.  He answers simple questions.     CMP ( most recent) CMP     Component Value Date/Time   NA 138 02/12/2020 2215   K 3.7 02/12/2020 2215   CL 102 02/12/2020 2215   CO2 26 02/12/2020 2215   GLUCOSE 110 (H) 02/12/2020 2215   BUN 28 (H) 02/12/2020 2215   CREATININE 1.17 02/12/2020 2215   CALCIUM 9.2 02/12/2020 2215   PROT 6.3 (L) 02/12/2020 2215   ALBUMIN 3.9 02/12/2020 2215   AST 22 02/12/2020 2215   ALT 21 02/12/2020 2215   ALKPHOS 48 02/12/2020 2215   BILITOT 0.7 02/12/2020 2215   GFRNONAA >60 02/12/2020 2215   GFRAA >60 02/12/2020 2215     Diabetic Labs (most recent): Lab Results  Component Value Date   HGBA1C 10.2 06/14/2020   HGBA1C 8.9 (H) 02/02/2020   HGBA1C 9.1 (H) 07/29/2017    Assessment & Plan:   1. 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  [facility] the [facility].  He has a past history of heavy alcohol use which likely  triggered the diabetes and exocrine pancreatic insufficiency.   He presents with his logs showing near target fasting glycemic profile, significantly above target postprandial glycemic profile his recent A1c was 10.2% in December 2021.  No major hypoglycemia was documented or reported.    He is accompanied by his power of attorney and nursing home aide.  Recent labs reviewed. - I had a long discussion with him and his company about the  nature of his diabetes and the pathology behind its complications.  -He is at risk of hypoglycemia due to lack of endogenous glucagon response from destruction of the alpha cells in addition to the beta cells. 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 These are all discussed in detail with him.  -He would benefit from more introduction of unprocessed or complex starch in lieu of   processed carbohydrates such as soda, sweet tea, and ice cream.   - I have approached him with the following individualized plan to manage  his diabetes and patient agrees:   -He has very limited options to treat his diabetes.  He is not a candidate for Metformin, or incretin therapy. -He will continue to need intensive treatment with basal/bolus insulin in order for him to achieve and maintain control of diabetes to target. -Accordingly, I advised to continue Lantus at 30 units nightly, adjust Humalog at 6 units 3 times a day with meals  for pre-meal BG readings of 80-150mg /dl, plus patient specific correction dose for unexpected hyperglycemia above 150mg /dl, associated with strict monitoring of glucose 4 times a day-before meals and at bedtime.  - he is warned not to take insulin without proper monitoring per orders. - Adjustment parameters are given to him for hypo and hyperglycemia in writing. - he is encouraged to call clinic for blood glucose levels less than 70 or above 300 mg /dl. -I advised discontinuation of his Metformin and Januvia.  - Specific targets for  A1c;  LDL, HDL,  and Triglycerides were discussed with the patient.  2) Blood Pressure /Hypertension: His blood pressure is controlled to target.  Evidently, he did not tolerate blood pressure medications.  He has a tendency for presyncope/syncope, currently on midodrine and fludrocortisone.  He will be evaluated for adrenal insufficiency on subsequent visits.   3) Lipids/Hyperlipidemia: No recent lipid panel to review.  He is currently on ongoing treatment with Lipitor 40 mg p.o. nightly.   Advised to  continue.     4)  Weight/Diet:  Body mass index is 21.9 kg/m.  -  he is not a candidate for weight loss.  He walks with a cane, fall risk is high.  Precautions discussed with him.   5) hypothyroidism: Duration and circumstance of diagnosis are not available to review.  No recent thyroid function test to review.  He is currently on levothyroxine 137 mcg p.o. every morning.  He will be considered for thyroid function test along with his subsequent labs.   - We discussed about the correct intake of his thyroid hormone, on empty stomach at fasting, with water, separated by at least 30 minutes from breakfast and other medications,  and separated by more than 4 hours from calcium, iron, multivitamins, acid reflux medications (PPIs). -Patient is made aware of the fact that thyroid hormone replacement is needed for life, dose to be adjusted by  periodic monitoring of thyroid function tests.  6) exocrine pancreatic insufficiency -Likely related to his past history of heavy alcohol consumption. He is currently on Creon 36,000 units- - 2 capsules with meals and 1 capsule with snacks.  He is advised to continue.  7) Chronic Care/Health Maintenance:  -he  Is on  Statin medications and  is encouraged to initiate and continue to follow up with Ophthalmology, Dentist,  Podiatrist at least yearly or according to recommendations, and advised to   stay away from smoking. I have recommended yearly flu vaccine and pneumonia vaccine at least every 5 years; moderate intensity exercise for up to 150 minutes weekly; and  sleep for at least 7 hours a day.  POCT ABI Results 07/24/20  His ABI was normal today. Right ABI: 1.14      left ABI: 1.17  Right leg systolic / diastolic: 158/88 mmHg Left leg systolic / diastolic: 160/90 mmHg  Arm systolic / diastolic: 139/89 mmHG  This study will be repeated in 5 years, or sooner if needed. - he is  advised to maintain close follow up with Colon Branch, MD for primary  care needs, as well as his other providers for optimal and coordinated care.   - Time spent on this patient care encounter:  45 min, of which > 50% was spent in  counseling and the rest reviewing his blood glucose logs , discussing his hypoglycemia and hyperglycemia episodes, reviewing his current and  previous labs / studies  ( including abstraction from other facilities) and medications  doses and developing a  long term treatment plan and documenting his care.   Please refer to Patient Instructions for Blood Glucose Monitoring and Insulin/Medications Dosing Guide"  in media tab for additional information. Please  also refer to " Patient Self Inventory" in the Media  tab for reviewed elements of pertinent patient history.  Joseph Bowen participated in the discussions, expressed understanding, and voiced agreement with the above plans.  All questions were answered to his satisfaction. he is encouraged to contact clinic should he have any questions or concerns prior to his return visit.    Follow up plan: - Return in about 9 weeks (around 09/25/2020) for F/U with Pre-visit Labs, Meter, Logs, A1c here.Marquis Lunch, MD [facility] [address] [place], [place] [address] Phone: [phone]  Fax: [phone]    07/24/2020, 11:51 AM  This note was partially dictated with voice recognition software. Similar sounding words can be transcribed inadequately or may not  be corrected upon review.

## 2020-08-08 NOTE — Progress Notes (Deleted)
Cardiology Office Note   Date:  08/08/2020   ID:  Joseph Bowen, Joseph Bowen 1946-02-07, MRN [id number]  PCP:  Colon Branch, MD  Cardiologist:   No primary care provider on file. Referring:  ***  No chief complaint on file.     History of Present Illness: Joseph Bowen is a 75 y.o. male who is referred by *** for evaluation of syncope.  ***   Past Medical History:  Diagnosis Date  . Alcohol-induced persisting amnestic disorder (HCC)   . Anemia   . Anxiety   . Atherosclerosis of arteries   . Benign prostatic hypertrophy   . Chronic pancreatitis (HCC)   . Constipation   . COPD (chronic obstructive pulmonary disease) (HCC)   . Dementia (HCC)   . Dementia (HCC)    due to encephalopathy from diabetic coma 2011.  . Depression   . Difficulty walking   . Dizziness and giddiness   . 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  . Essential hypertension   . Euthyroid sick syndrome   . Glaucoma   . H/O ETOH abuse   . History of falling   . Hydrocele   . Hypoglycemia   . Hypothyroidism   . Insomnia   . Kidney stone   . Legally blind   . Orthostatic hypotension   . Pain in left shoulder   . Personal history of alcoholism (HCC)   . Post traumatic stress disorder   . Psychosis (HCC)   . Radiculopathy, cervical region   . Retinopathy   . Sleep terrors (night terrors)   . Syncope and collapse   . Type 2 diabetes mellitus (HCC)   . Urinary incontinence   . Vitamin B deficiency   . Vitamin D deficiency     Past Surgical History:  Procedure Laterality Date  . BACK SURGERY     removal of melanoma of back.  . 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 COLONOSCOPY WITH PROPOFOL N/A 08/15/2015   Dr. Darrick Penna: left colon redundant, pediatric scope used, sigmoid colon woult not adequately insufflate, moderate diverticulosis throughout entire colon, moderate sized IH   . EXTRACORPOREAL SHOCK WAVE LITHOTRIPSY Right 11/18/2016    Procedure: RIGHT EXTRACORPOREAL SHOCK WAVE LITHOTRIPSY (ESWL);  Surgeon: Barron Alvine, MD;  Location: [facility];  Service: Urology;  Laterality: Right;  . HAND SURGERY Right    middle finger; fractue  . HEMORRHOID SURGERY    . HERNIA REPAIR Left    inguinal  . HIP SURGERY Bilateral    bilateral hip fractures  . MOHS SURGERY     Melanoma     Current Outpatient Medications  Medication Sig Dispense Refill  . alum & mag hydroxide-simeth (MAALOX/MYLANTA) 200-200-20 MG/5ML suspension Take 30 mLs by mouth every 6 (six) hours as needed for indigestion or heartburn.    Marland Kitchen atorvastatin (LIPITOR) 40 MG tablet Take 40 mg by mouth daily.    . Cholecalciferol (VITAMIN D3) 1.25 MG (50000 UT) TABS Take 1 tablet by mouth once a week.    . ferrous sulfate 325 (65 FE) MG tablet Take 325 mg by mouth daily with breakfast.    . fludrocortisone (FLORINEF) 0.1 MG tablet Take 1 tablet (0.1 mg total) by mouth daily. 30 tablet 6  . gabapentin (NEURONTIN) 100 MG capsule Take 200 mg by mouth 3 (three) times daily.     Marland Kitchen HUMALOG 100 UNIT/ML injection Inject 5-8 Units into the skin 3 (three)  times daily before meals.    . hydrALAZINE (APRESOLINE) 25 MG tablet Take 1 tablet (25 mg total) by mouth 3 (three) times daily. HOLD FOR SBP<130 (Patient taking differently: Take 25 mg by mouth daily. HOLD FOR SBP<130)    . insulin glargine (LANTUS) 100 UNIT/ML injection Inject 0.3 mLs (30 Units total) into the skin daily. 10 mL 2  . levothyroxine (SYNTHROID) 137 MCG tablet Take 137 mcg by mouth daily.    . lipase/protease/amylase (CREON) 36000 UNITS CPEP capsule Take 2 capsule with meals and 1 capsule with snacks total of 8 daily. 270 capsule 5  . loperamide (IMODIUM A-D) 2 MG tablet Take 2 mg by mouth as needed for diarrhea or loose stools.    Marland Kitchen loratadine (CLARITIN) 10 MG tablet Take 10 mg by mouth daily as needed for allergies.    Marland Kitchen LORazepam (ATIVAN) 0.5 MG tablet Take 1 tablet (0.5 mg total) by mouth 2 (two) times daily.  (Patient taking differently: Take 0.5 mg by mouth every 6 (six) hours as needed. ) 10 tablet 0  . melatonin 5 MG TABS Take 5 mg by mouth at bedtime.    . memantine (NAMENDA) 5 MG tablet Take 5 mg by mouth 2 (two) times daily.    . midodrine (PROAMATINE) 5 MG tablet Take 5 mg by mouth daily as needed.    . mirtazapine (REMERON) 7.5 MG tablet Take 7.5 mg by mouth at bedtime.    Marland Kitchen OLANZapine zydis (ZYPREXA) 5 MG disintegrating tablet SMARTSIG:1 Tablet(s) By Mouth Every Evening    . ondansetron (ZOFRAN) 4 MG tablet Take 4 mg by mouth every 8 (eight) hours as needed for nausea or vomiting.    Bertram Gala Glycol-Propyl Glycol (SYSTANE) 0.4-0.3 % SOLN Apply 1 drop to eye 2 (two) times daily.    . prazosin (MINIPRESS) 1 MG capsule Take 1 mg by mouth at bedtime.    . sennosides-docusate sodium (SENOKOT-S) 8.6-50 MG tablet Take 1 tablet by mouth 2 (two) times daily.    . sertraline (ZOLOFT) 50 MG tablet Take 50 mg by mouth daily.     Marland Kitchen thiamine 100 MG tablet Take 100 mg by mouth daily.    . traZODone (DESYREL) 50 MG tablet Take 50 mg by mouth at bedtime.     No current facility-administered medications for this visit.    Allergies:   Patient has no known allergies.    Social History:  The patient  reports that he quit smoking about 10 years ago. His smoking use included cigarettes. He started smoking about 58 years ago. He has a 72.00 pack-year smoking history. He has never used smokeless tobacco. He reports that he does not drink alcohol and does not use drugs.   Family History:  The patient's ***family history includes Diabetes in an other family member; Heart disease in an other family member.    ROS:  Please see the history of present illness.   Otherwise, review of systems are positive for {NONE DEFAULTED:18576::"none"}.   All other systems are reviewed and negative.    PHYSICAL EXAM: VS:  There were no vitals taken for this visit. , BMI There is no height or weight on file to calculate  BMI. GENERAL:  Well appearing HEENT:  Pupils equal round and reactive, fundi not visualized, oral mucosa unremarkable NECK:  No jugular venous distention, waveform within normal limits, carotid upstroke brisk and symmetric, no bruits, no thyromegaly LYMPHATICS:  No cervical, inguinal adenopathy LUNGS:  Clear to auscultation bilaterally BACK:  No CVA tenderness CHEST:  Unremarkable HEART:  PMI not displaced or sustained,S1 and S2 within normal limits, no S3, no S4, no clicks, no rubs, *** murmurs ABD:  Flat, positive bowel sounds normal in frequency in pitch, no bruits, no rebound, no guarding, no midline pulsatile mass, no hepatomegaly, no splenomegaly EXT:  2 plus pulses throughout, no edema, no cyanosis no clubbing SKIN:  No rashes no nodules NEURO:  Cranial nerves II through XII grossly intact, motor grossly intact throughout PSYCH:  Cognitively intact, oriented to person place and time    EKG:  EKG {ACTION; IS/IS OMB:[id number]} ordered today. The ekg ordered today demonstrates ***   Recent Labs: 02/04/2020: B Natriuretic Peptide 222.0; Magnesium 1.7 02/12/2020: ALT 21; BUN 28; Creatinine, Ser 1.17; Hemoglobin 11.4; Platelets 208; Potassium 3.7; Sodium 138    Lipid Panel No results found for: CHOL, TRIG, HDL, CHOLHDL, VLDL, LDLCALC, LDLDIRECT    Wt Readings from Last 3 Encounters:  07/24/20 144 lb (65.3 kg)  07/13/20 146 lb 12.8 oz (66.6 kg)  04/11/20 143 lb 9.6 oz (65.1 kg)      Other studies Reviewed: Additional studies/ records that were reviewed today include: ***. Review of the above records demonstrates:  Please see elsewhere in the note.  ***   ASSESSMENT AND PLAN:  SYNCOPE:  ***   Current medicines are reviewed at length with the patient today.  The patient {ACTIONS; HAS/DOES NOT HAVE:19233} concerns regarding medicines.  The following changes have been made:  {PLAN; NO CHANGE:13088:s}  Labs/ tests ordered today include: *** No orders of the defined types  were placed in this encounter.    Disposition:   FU with ***    Signed, Rollene Rotunda, MD  08/08/2020 8:52 PM    [facility]

## 2020-08-09 ENCOUNTER — Ambulatory Visit: Payer: Medicare Other | Admitting: Cardiology

## 2020-08-24 LAB — COMPREHENSIVE METABOLIC PANEL
Calcium: 9.1 (ref 8.7–10.7)
GFR calc Af Amer: 100
GFR calc non Af Amer: 86

## 2020-08-24 LAB — TSH: TSH: 2.35 (ref 0.41–5.90)

## 2020-08-24 LAB — BASIC METABOLIC PANEL
BUN: 23 — AB (ref 4–21)
Creatinine: 0.9 (ref 0.6–1.3)

## 2020-08-31 ENCOUNTER — Other Ambulatory Visit: Payer: Self-pay

## 2020-08-31 ENCOUNTER — Emergency Department (HOSPITAL_COMMUNITY)
Admission: EM | Admit: 2020-08-31 | Discharge: 2020-09-01 | Disposition: A | Discharge status: Home / Self Care | Payer: Medicare Other | Attending: Emergency Medicine | Admitting: Emergency Medicine

## 2020-08-31 ENCOUNTER — Emergency Department (HOSPITAL_COMMUNITY): Payer: Medicare Other

## 2020-08-31 DIAGNOSIS — E039 Hypothyroidism, unspecified: Secondary | ICD-10-CM | POA: Diagnosis not present

## 2020-08-31 DIAGNOSIS — J449 Chronic obstructive pulmonary disease, unspecified: Secondary | ICD-10-CM | POA: Diagnosis not present

## 2020-08-31 DIAGNOSIS — Z794 Long term (current) use of insulin: Secondary | ICD-10-CM | POA: Diagnosis not present

## 2020-08-31 DIAGNOSIS — D649 Anemia, unspecified: Secondary | ICD-10-CM

## 2020-08-31 DIAGNOSIS — Z87891 Personal history of nicotine dependence: Secondary | ICD-10-CM | POA: Insufficient documentation

## 2020-08-31 DIAGNOSIS — Z79899 Other long term (current) drug therapy: Secondary | ICD-10-CM | POA: Diagnosis not present

## 2020-08-31 DIAGNOSIS — I10 Essential (primary) hypertension: Secondary | ICD-10-CM | POA: Insufficient documentation

## 2020-08-31 DIAGNOSIS — E1165 Type 2 diabetes mellitus with hyperglycemia: Secondary | ICD-10-CM | POA: Insufficient documentation

## 2020-08-31 DIAGNOSIS — F0391 Unspecified dementia with behavioral disturbance: Secondary | ICD-10-CM | POA: Insufficient documentation

## 2020-08-31 DIAGNOSIS — R4182 Altered mental status, unspecified: Secondary | ICD-10-CM | POA: Diagnosis present

## 2020-08-31 DIAGNOSIS — D500 Iron deficiency anemia secondary to blood loss (chronic): Secondary | ICD-10-CM | POA: Diagnosis not present

## 2020-08-31 NOTE — ED Triage Notes (Signed)
Patient is from [facility]. Per facility patient has AMS. Per report patient was combative with staff and other residents at facility and received ativan and haldol. Per [facility] patient is now alert and oriented. VSS. Patient is currently sleeping. No distress noted.

## 2020-08-31 NOTE — ED Provider Notes (Signed)
[name]   [name] Arrival date & time: 08/31/20  2213   History Chief Complaint  Patient presents with  . Altered Mental Status    Joseph Bowen is a 75 y.o. male.  The history is provided by the nursing home and the EMS personnel. The history is limited by the condition of the patient (Altered mental status).  Altered Mental Status He has history of hypertension, diabetes, dementia with agitation and was sent here from his skilled nursing facility because of altered mental status.  He is reported to have been combative and was given lorazepam and haloperidol.  EMS noted patient was alert and oriented.  When I went to see the patient, he was sleeping and difficult to arouse.  Past Medical History:  Diagnosis Date  . Alcohol-induced persisting amnestic disorder (HCC)   . Anemia   . Anxiety   . Atherosclerosis of arteries   . Benign prostatic hypertrophy   . Chronic pancreatitis (HCC)   . Constipation   . COPD (chronic obstructive pulmonary disease) (HCC)   . Dementia (HCC)   . Dementia (HCC)    due to encephalopathy from diabetic coma 2011.  . Depression   . Difficulty walking   . Dizziness and giddiness   . 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  . Essential hypertension   . Euthyroid sick syndrome   . Glaucoma   . H/O ETOH abuse   . History of falling   . Hydrocele   . Hypoglycemia   . Hypothyroidism   . Insomnia   . Kidney stone   . Legally blind   . Orthostatic hypotension   . Pain in left shoulder   . Personal history of alcoholism (HCC)   . Post traumatic stress disorder   . Psychosis (HCC)   . Radiculopathy, cervical region   . Retinopathy   . Sleep terrors (night terrors)   . Syncope and collapse   . Type 2 diabetes mellitus (HCC)   . Urinary incontinence   . Vitamin B deficiency   . Vitamin D deficiency     Patient Active Problem List   Diagnosis Date Noted  . 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 Exocrine pancreatic insufficiency 07/13/2020  . Gallstones   . Abnormal computed tomography angiography (CTA)   . Hypomagnesemia 02/03/2020  . Hypothermia 02/03/2020  . AKI (acute kidney injury) (HCC) 02/02/2020  . UTI (urinary tract infection) 02/02/2020  . Lactic acidosis 02/02/2020  . Severe dehydration 02/02/2020  . 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 History of alcoholism (HCC) 02/09/2017  . Post traumatic stress disorder 02/09/2017  . Hydrocele 02/09/2017  . Impacted cerumen of right ear 02/09/2017  . Contusion of right upper back excluding scapular region 02/08/2017  . Anemia 01/23/2017  . Difficulty walking 01/23/2017  . COPD (chronic obstructive pulmonary disease) (HCC) 01/18/2017  . BPH (benign prostatic hyperplasia) 01/18/2017  . Chronic pancreatitis (HCC) 01/18/2017  . Essential hypertension 01/18/2017  . Glaucoma 01/18/2017  . Hypothyroidism 01/18/2017  . Solitary pulmonary nodule 01/18/2017  . Vitamin B12 deficiency 01/18/2017  . Dementia with behavioral disturbance (HCC) 01/17/2017  . Slow transit constipation 09/04/2016  . Left cervical radiculopathy 07/26/2016  . Closed head injury 10/20/2015  . Fall 10/20/2015  . Heme + stool 07/27/2015  . 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 Memory loss 10/31/2014  . Dizziness 07/04/2014  . Syncope 07/04/2014  . Type 2 diabetes  mellitus with hyperglycemia, with long-term current use of insulin (HCC) 07/04/2014  . Fracture, pelvis closed (HCC) 12/01/2013  . Proximal phalanx fracture of finger 12/01/2013  . Fx metacarpal neck-closed 12/01/2013  . Pelvis fracture (HCC) 11/10/2012  . Pelvic fracture (HCC) 09/23/2012  . Dislocation of PIP joint of finger 09/23/2012  . Dupuytren's disease of palm 09/02/2012    Past Surgical History:  Procedure Laterality Date  . BACK SURGERY     removal of melanoma of back.  . CELIAC ARTERY  ANEURYSM REPAIR  2006   Embolization for celiac artery aneurysm  . COLONOSCOPY WITH PROPOFOL N/A 08/15/2015   Dr. Darrick Penna: left colon redundant, pediatric scope used, sigmoid colon woult not adequately insufflate, moderate diverticulosis throughout entire colon, moderate sized IH   . EXTRACORPOREAL SHOCK WAVE LITHOTRIPSY Right 11/18/2016   Procedure: RIGHT EXTRACORPOREAL SHOCK WAVE LITHOTRIPSY (ESWL);  Surgeon: Barron Alvine, MD;  Location: [facility];  Service: Urology;  Laterality: Right;  . HAND SURGERY Right    middle finger; fractue  . HEMORRHOID SURGERY    . HERNIA REPAIR Left    inguinal  . HIP SURGERY Bilateral    bilateral hip fractures  . MOHS SURGERY     Melanoma       Family History  Problem Relation Age of Onset  . Heart disease Other   . Diabetes Other     Social History   Tobacco Use  . Smoking status: Former Smoker    Packs/day: 1.50    Years: 48.00    Pack years: 72.00    Types: Cigarettes    Start date: 09/17/1961    Quit date: 09/17/2009    Years since quitting: 10.9  . Smokeless tobacco: Never Used  Substance Use Topics  . Alcohol use: No    Alcohol/week: 0.0 standard drinks    Comment: hx of alcoholism-last used 2011  . Drug use: No    Home Medications Prior to Admission medications   Medication Sig Start Date End Date Taking? Authorizing Provider  alum & mag hydroxide-simeth (MAALOX/MYLANTA) 200-200-20 MG/5ML suspension Take 30 mLs by mouth every 6 (six) hours as needed for indigestion or heartburn.    [provider]  atorvastatin (LIPITOR) 40 MG tablet Take 40 mg by mouth daily.    [provider]  Cholecalciferol (VITAMIN D3) 1.25 MG (50000 UT) TABS Take 1 tablet by mouth once a week.    [provider]  ferrous sulfate 325 (65 FE) MG tablet Take 325 mg by mouth daily with breakfast.    [provider]  fludrocortisone (FLORINEF) 0.1 MG tablet Take 1 tablet (0.1 mg total) by mouth daily. 08/15/16   Jonelle Sidle,  MD  gabapentin (NEURONTIN) 100 MG capsule Take 200 mg by mouth 3 (three) times daily.     [provider]  HUMALOG 100 UNIT/ML injection Inject 5-8 Units into the skin 3 (three) times daily before meals. 01/31/20   [provider]  hydrALAZINE (APRESOLINE) 25 MG tablet Take 1 tablet (25 mg total) by mouth 3 (three) times daily. HOLD FOR SBP<130 Patient taking differently: Take 25 mg by mouth daily. HOLD FOR SBP<130 02/04/20   Johnson, Clanford L, MD  insulin glargine (LANTUS) 100 UNIT/ML injection Inject 0.3 mLs (30 Units total) into the skin daily. 07/13/20   Roma Kayser, MD  levothyroxine (SYNTHROID) 137 MCG tablet Take 137 mcg by mouth daily. 02/07/20   [provider]  lipase/protease/amylase (CREON) 36000 UNITS CPEP capsule Take 2 capsule with meals  and 1 capsule with snacks total of 8 daily. 04/12/20   Tiffany KocherLewis, Leslie S, PA-C  loperamide (IMODIUM A-D) 2 MG tablet Take 2 mg by mouth as needed for diarrhea or loose stools.    [provider]  loratadine (CLARITIN) 10 MG tablet Take 10 mg by mouth daily as needed for allergies.    [provider]  LORazepam (ATIVAN) 0.5 MG tablet Take 1 tablet (0.5 mg total) by mouth 2 (two) times daily. Patient taking differently: Take 0.5 mg by mouth every 6 (six) hours as needed.  02/04/20   Johnson, Clanford L, MD  melatonin 5 MG TABS Take 5 mg by mouth at bedtime.    [provider]  memantine (NAMENDA) 5 MG tablet Take 5 mg by mouth 2 (two) times daily.    [provider]  midodrine (PROAMATINE) 5 MG tablet Take 5 mg by mouth daily as needed.    [provider]  mirtazapine (REMERON) 7.5 MG tablet Take 7.5 mg by mouth at bedtime. 02/14/20   [provider]  OLANZapine zydis (ZYPREXA) 5 MG disintegrating tablet SMARTSIG:1 Tablet(s) By Mouth Every Evening 02/23/20   [provider]  ondansetron (ZOFRAN) 4 MG tablet Take 4 mg by mouth every 8 (eight) hours as needed for  nausea or vomiting.    [provider]  Polyethyl Glycol-Propyl Glycol (SYSTANE) 0.4-0.3 % SOLN Apply 1 drop to eye 2 (two) times daily.    [provider]  prazosin (MINIPRESS) 1 MG capsule Take 1 mg by mouth at bedtime.    [provider]  sennosides-docusate sodium (SENOKOT-S) 8.6-50 MG tablet Take 1 tablet by mouth 2 (two) times daily.    [provider]  sertraline (ZOLOFT) 50 MG tablet Take 50 mg by mouth daily.     [provider]  thiamine 100 MG tablet Take 100 mg by mouth daily.    [provider]  traZODone (DESYREL) 50 MG tablet Take 50 mg by mouth at bedtime.    [provider]    Allergies    Patient has no known allergies.  Review of Systems   Review of Systems  Unable to perform ROS: Mental status change    Physical Exam Updated Vital Signs BP 131/70 (BP Location: Right Arm)   Pulse (!) 58   Temp 97.7 F (36.5 C) (Oral)   Resp (!) 21   SpO2 99%   Physical Exam Vitals and nursing note reviewed.   75 year old male, resting comfortably and in no acute distress. Vital signs are significant for slightly slow heart rate and slightly elevated respiratory rate. Oxygen saturation is 97%, which is normal. Head is normocephalic and atraumatic. PERRLA, EOMI. Oropharynx is clear. Neck is nontender and supple without adenopathy or JVD. Back is nontender and there is no CVA tenderness. Lungs are clear without rales, wheezes, or rhonchi. Chest is nontender. Heart has regular rate and rhythm without murmur. Abdomen is soft, flat, nontender without masses or hepatosplenomegaly and peristalsis is normoactive. Extremities have no cyanosis or edema, full range of motion is present. Skin is warm and dry without rash. Neurologic: Sleeping but able to be awakened.  Oriented to person.  Cranial nerves grossly intact.  Moves all extremities equally.  ED Results / Procedures / Treatments   Labs (all labs ordered are  listed, but only abnormal results are displayed) Labs Reviewed  COMPREHENSIVE METABOLIC PANEL  CBC WITH DIFFERENTIAL/PLATELET  URINALYSIS, ROUTINE W REFLEX MICROSCOPIC    EKG None  Radiology No results found.  Procedures Procedures   Medications Ordered in ED Medications - No data to display  ED Course  I have reviewed the triage vital signs and the nursing notes.  Pertinent labs & imaging results that were available during my care of the patient were reviewed by me and considered in my medical decision making (see chart for details).  MDM Rules/Calculators/A&P Report of altered mental status with agitation, but patient is somnolent here most likely secondary to medication received at skilled nursing facility.  Old records were reviewed, and he was recently admitted to a behavioral health facility because of similar complaints.  Will check screening labs to look for possible source of infection.  Labs are unremarkable.  Urinalysis showed no evidence of infection.  There is a mild anemia which is unchanged from baseline.  Patient has not shown any behavioral disturbance while in the ED and I suspect that is because he was appropriately medicated before transfer.  No indication for hospital admission.  Providers will need to find an appropriate regimen to keep him from acting out at the facility where he resides.  Final Clinical Impression(s) / ED Diagnoses Final diagnoses:  Dementia with behavioral disturbance, unspecified dementia type (HCC)  Normochromic normocytic anemia    Rx / DC Orders ED Discharge Orders    None       Dione Booze, MD 09/01/20 [phone]

## 2020-09-01 LAB — URINALYSIS, ROUTINE W REFLEX MICROSCOPIC
Bacteria, UA: NONE SEEN
Bilirubin Urine: NEGATIVE
Glucose, UA: >=500 mg/dL — AB
Hgb urine dipstick: NEGATIVE
Ketones, ur: NEGATIVE mg/dL
Leukocytes,Ua: NEGATIVE
Nitrite: NEGATIVE
Protein, ur: NEGATIVE mg/dL
Specific Gravity, Urine: 1.025 (ref 1.005–1.030)
pH: 5 (ref 5.0–8.0)

## 2020-09-01 LAB — CBC WITH DIFFERENTIAL/PLATELET
Abs Immature Granulocytes: 0.06 10*3/uL (ref 0.00–0.07)
Basophils Absolute: 0 10*3/uL (ref 0.0–0.1)
Basophils Relative: 0 %
Eosinophils Absolute: 0.3 10*3/uL (ref 0.0–0.5)
Eosinophils Relative: 5 %
HCT: 38.7 % — ABNORMAL LOW (ref 39.0–52.0)
Hemoglobin: 12.4 g/dL — ABNORMAL LOW (ref 13.0–17.0)
Immature Granulocytes: 1 %
Lymphocytes Relative: 25 %
Lymphs Abs: 1.7 10*3/uL (ref 0.7–4.0)
MCH: 30 pg (ref 26.0–34.0)
MCHC: 32 g/dL (ref 30.0–36.0)
MCV: 93.7 fL (ref 80.0–100.0)
Monocytes Absolute: 0.8 10*3/uL (ref 0.1–1.0)
Monocytes Relative: 12 %
Neutro Abs: 3.9 10*3/uL (ref 1.7–7.7)
Neutrophils Relative %: 57 %
Platelets: 181 10*3/uL (ref 150–400)
RBC: 4.13 MIL/uL — ABNORMAL LOW (ref 4.22–5.81)
RDW: 15.1 % (ref 11.5–15.5)
WBC: 6.8 10*3/uL (ref 4.0–10.5)
nRBC: 0 % (ref 0.0–0.2)

## 2020-09-01 LAB — COMPREHENSIVE METABOLIC PANEL
ALT: 22 U/L (ref 0–44)
AST: 22 U/L (ref 15–41)
Albumin: 3.3 g/dL — ABNORMAL LOW (ref 3.5–5.0)
Alkaline Phosphatase: 47 U/L (ref 38–126)
Anion gap: 6 (ref 5–15)
BUN: 23 mg/dL (ref 8–23)
CO2: 25 mmol/L (ref 22–32)
Calcium: 8.8 mg/dL — ABNORMAL LOW (ref 8.9–10.3)
Chloride: 104 mmol/L (ref 98–111)
Creatinine, Ser: 0.99 mg/dL (ref 0.61–1.24)
GFR, Estimated: >60 mL/min (ref >60)
Glucose, Bld: 116 mg/dL — ABNORMAL HIGH (ref 70–99)
Potassium: 3.7 mmol/L (ref 3.5–5.1)
Sodium: 135 mmol/L (ref 135–145)
Total Bilirubin: 0.4 mg/dL (ref 0.3–1.2)
Total Protein: 5.9 g/dL — ABNORMAL LOW (ref 6.5–8.1)

## 2020-09-01 NOTE — ED Notes (Signed)
Waiting for transportation back to [facility]

## 2020-09-01 NOTE — ED Notes (Signed)
Per [facility], states that they "want a ct scan for ams"

## 2020-09-01 NOTE — ED Notes (Signed)
Report called to Miranda at [facility]

## 2020-09-01 NOTE — ED Notes (Signed)
Pt transported via transport team.

## 2020-09-01 NOTE — Discharge Instructions (Signed)
Work with your primary care provider to find an appropriate medication regimen for you.

## 2020-09-12 LAB — HEMOGLOBIN A1C: Hemoglobin A1C: 10.2

## 2020-09-19 ENCOUNTER — Other Ambulatory Visit: Payer: Self-pay

## 2020-09-19 ENCOUNTER — Ambulatory Visit (INDEPENDENT_AMBULATORY_CARE_PROVIDER_SITE_OTHER): Payer: Medicare Other | Admitting: Neurology

## 2020-09-19 ENCOUNTER — Encounter: Payer: Self-pay | Admitting: Neurology

## 2020-09-19 VITALS — BP 180/97 | HR 70 | Ht 68.0 in | Wt 152.0 lb

## 2020-09-19 DIAGNOSIS — F1027 Alcohol dependence with alcohol-induced persisting dementia: Secondary | ICD-10-CM | POA: Diagnosis not present

## 2020-09-19 NOTE — Patient Instructions (Signed)
1. Bloodwork for thiamine level  2. Please fax MAR to our office [phone]  3. Continue follow-up with Psychiatry  4. Continue close supervision
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
## 2020-09-26 ENCOUNTER — Telehealth: Payer: Self-pay

## 2020-09-26 ENCOUNTER — Encounter: Payer: Self-pay | Admitting: "Endocrinology

## 2020-09-26 ENCOUNTER — Other Ambulatory Visit: Payer: Self-pay

## 2020-09-26 ENCOUNTER — Ambulatory Visit (INDEPENDENT_AMBULATORY_CARE_PROVIDER_SITE_OTHER): Payer: Medicare Other | Admitting: "Endocrinology

## 2020-09-26 VITALS — BP 112/68 | HR 76 | Ht 68.0 in | Wt 152.6 lb

## 2020-09-26 DIAGNOSIS — E089 Diabetes mellitus due to underlying condition without complications: Secondary | ICD-10-CM

## 2020-09-26 DIAGNOSIS — K8689 Other specified diseases of pancreas: Secondary | ICD-10-CM

## 2020-09-26 DIAGNOSIS — E039 Hypothyroidism, unspecified: Secondary | ICD-10-CM

## 2020-09-26 DIAGNOSIS — K8681 Exocrine pancreatic insufficiency: Secondary | ICD-10-CM | POA: Diagnosis not present

## 2020-09-26 NOTE — Telephone Encounter (Signed)
Joseph Bowen with [facility] stated pt had not been taking lantus 30 units. States he has been taking lantus 18 units in the a.m. and 15 units at bedtime. Also stated pt has been using Novolog only as a sliding scale.

## 2020-09-26 NOTE — Telephone Encounter (Signed)
Joseph Bowen at the facility needs a call back regarding some changes for today that he had at his [phone]

## 2020-09-26 NOTE — Telephone Encounter (Signed)
Left a message requesting a return call to the office. 

## 2020-09-26 NOTE — Progress Notes (Signed)
09/26/2020, 11:28 AM  Endocrinology follow-up note   Subjective:    Patient ID: Joseph Bowen, male    DOB: 11-18-1945.  Joseph Bowen is being seen in follow-up after he was seen in consultation for management of currently uncontrolled symptomatic diabetes requested by  Colon Branch, MD. Patient was accompanied by his power of attorney [name] and nursing home aide.  Past Medical History:  Diagnosis Date  . Alcohol-induced persisting amnestic disorder (HCC)   . Anemia   . Anxiety   . Atherosclerosis of arteries   . Benign prostatic hypertrophy   . Chronic pancreatitis (HCC)   . Constipation   . COPD (chronic obstructive pulmonary disease) (HCC)   . Dementia (HCC)   . Dementia (HCC)    due to encephalopathy from diabetic coma 2011.  . Depression   . Difficulty walking   . Dizziness and giddiness   . 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  . Essential hypertension   . Euthyroid sick syndrome   . Glaucoma   . H/O ETOH abuse   . History of falling   . Hydrocele   . Hypoglycemia   . Hypothyroidism   . Insomnia   . Kidney stone   . Legally blind   . Orthostatic hypotension   . Pain in left shoulder   . Personal history of alcoholism (HCC)   . Post traumatic stress disorder   . Psychosis (HCC)   . Radiculopathy, cervical region   . Retinopathy   . Sleep terrors (night terrors)   . Syncope and collapse   . Type 2 diabetes mellitus (HCC)   . Urinary incontinence   . Vitamin B deficiency   . Vitamin D deficiency     Past Surgical History:  Procedure Laterality Date  . BACK SURGERY     removal of melanoma of back.  . 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 COLONOSCOPY WITH PROPOFOL N/A 08/15/2015   Dr. Darrick Penna: left colon redundant, pediatric scope used, sigmoid colon woult not adequately insufflate, moderate diverticulosis throughout entire colon, moderate sized IH   .  EXTRACORPOREAL SHOCK WAVE LITHOTRIPSY Right 11/18/2016   Procedure: RIGHT EXTRACORPOREAL SHOCK WAVE LITHOTRIPSY (ESWL);  Surgeon: Barron Alvine, MD;  Location: [facility];  Service: Urology;  Laterality: Right;  . HAND SURGERY Right    middle finger; fractue  . HEMORRHOID SURGERY    . HERNIA REPAIR Left    inguinal  . HIP SURGERY Bilateral    bilateral hip fractures  . MOHS SURGERY     Melanoma    Social History   Socioeconomic History  . Marital status: Divorced    Spouse name: Not on file  . Number of children: Not on file  . Years of education: Not on file  . Highest education level: Not on file  Occupational History  . Occupation: retired  Tobacco Use  . Smoking status: Former Smoker    Packs/day: 1.50    Years: 48.00    Pack years: 72.00    Types: Cigarettes    Start date: 09/17/1961    Quit date: 09/17/2009    Years since quitting: 11.0  . Smokeless tobacco: Never Used  Vaping Use  . Vaping Use: Never used  Substance and Sexual Activity  . Alcohol use: No    Alcohol/week: 0.0 standard drinks    Comment: hx of alcoholism-last used 2011  . Drug  use: No  . Sexual activity: Never    Birth control/protection: None  Other Topics Concern  . Not on file  Social History Narrative  . Not on file   Social Determinants of Health   Financial Resource Strain: Not on file  Food Insecurity: Not on file  Transportation Needs: Not on file  Physical Activity: Not on file  Stress: Not on file  Social Connections: Not on file    Family History  Problem Relation Age of Onset  . Heart disease Other   . Diabetes Other     Outpatient Encounter Medications as of 09/26/2020  Medication Sig  . atorvastatin (LIPITOR) 20 MG tablet Take 20 mg by mouth daily.  . Cholecalciferol (VITAMIN D3) 1.25 MG (50000 UT) TABS Take 1 tablet by mouth once a week.  . Cyanocobalamin (VITAMIN B 12 PO) Take 1,000 mcg by mouth daily.  . divalproex (DEPAKOTE SPRINKLE) 125 MG capsule Take 250 mg by mouth 2  (two) times daily as needed.  . docusate sodium (COLACE) 100 MG capsule Take 100 mg by mouth daily as needed for mild constipation.  . ferrous sulfate 325 (65 FE) MG tablet Take 325 mg by mouth daily with breakfast.  . gabapentin (NEURONTIN) 400 MG capsule Take 400 mg by mouth 3 (three) times daily.  . haloperidol (HALDOL) 5 MG tablet Take 5 mg by mouth at bedtime.  Marland Kitchen HUMALOG 100 UNIT/ML injection Inject 7-10 Units into the skin 3 (three) times daily before meals.  . insulin glargine (LANTUS) 100 UNIT/ML injection Inject 0.3 mLs (30 Units total) into the skin daily.  Marland Kitchen levothyroxine (SYNTHROID) 125 MCG tablet Take 125 mcg by mouth daily.  . lipase/protease/amylase (CREON) 36000 UNITS CPEP capsule Take 2 capsule with meals and 1 capsule with snacks total of 8 daily.  Marland Kitchen LORazepam (ATIVAN) 0.5 MG tablet Take 1 tablet (0.5 mg total) by mouth 2 (two) times daily. (Patient taking differently: Take 0.5 mg by mouth every 6 (six) hours as needed.)  . melatonin 3 MG TABS tablet Take 3 mg by mouth at bedtime.  . memantine (NAMENDA) 5 MG tablet Take 5 mg by mouth daily.  . midodrine (PROAMATINE) 5 MG tablet Take 5 mg by mouth daily as needed. (Patient not taking: Reported on 09/26/2020)  . mirtazapine (REMERON) 7.5 MG tablet Take 7.5 mg by mouth at bedtime.  Marland Kitchen omeprazole (PRILOSEC) 20 MG capsule Take 20 mg by mouth daily.  Bertram Gala Glycol-Propyl Glycol (SYSTANE) 0.4-0.3 % SOLN Apply 1 drop to eye 2 (two) times daily.  . prazosin (MINIPRESS) 1 MG capsule Take 1 mg by mouth at bedtime.  . sennosides-docusate sodium (SENOKOT-S) 8.6-50 MG tablet Take 1 tablet by mouth 2 (two) times daily.  . sertraline (ZOLOFT) 50 MG tablet Take 75 mg by mouth daily.  . tamsulosin (FLOMAX) 0.4 MG CAPS capsule Take 0.4 mg by mouth.  . thiamine 100 MG tablet Take 100 mg by mouth daily.  . traZODone (DESYREL) 50 MG tablet Take 50 mg by mouth at bedtime.   No facility-administered encounter medications on file as of 09/26/2020.     ALLERGIES: No Known Allergies  VACCINATION STATUS:  There is no immunization history on file for this patient.  Diabetes He presents for his follow-up diabetic visit. Diabetes type: His diabetes is likely induced by pancreatic insufficiency related to alcohol history. Onset time: He stayed in [facility] nursing home for the last 11 years, came with a diagnosis of diabetes. His disease course has been worsening.  There are no hypoglycemic associated symptoms. Pertinent negatives for hypoglycemia include no confusion, headaches, pallor or seizures. There are no diabetic associated symptoms. Pertinent negatives for diabetes include no chest pain, no fatigue, no polydipsia, no polyphagia, no polyuria and no weakness. There are no hypoglycemic complications. Symptoms are improving. Risk factors for coronary artery disease include diabetes mellitus, dyslipidemia, hypertension, tobacco exposure, male sex and sedentary lifestyle (His diabetes is complicated by pancreatic insufficiency, history of encephalopathy, retinopathy, autonomic dysfunction with disequilibrium.). Current diabetic treatments: Is currently on Metformin 1000 mg p.o. twice daily, Januvia 100 mg p.o. daily, Lantus 28 units nightly, Humalog 2-12 units 4 times a day.  Compliance with diabetes treatment: He is 100% dependent on others for care. His weight is increasing steadily (Patient does not remember how much his routine weight has been.  However, he was found to be under 100 pounds during admission to nursing home.  He is currently at 152 pounds.). He is following a generally unhealthy diet. He has not had a previous visit with a dietitian. He never participates in exercise. Blood glucose monitoring compliance is adequate. His home blood glucose trend is fluctuating minimally. His breakfast blood glucose range is generally 180-200 mg/dl. His lunch blood glucose range is generally >200 mg/dl. His dinner blood glucose range is generally >200  mg/dl. His bedtime blood glucose range is generally >200 mg/dl. His overall blood glucose range is >200 mg/dl. (He presents with his logs showing much less hypoglycemic episodes.  Due to some inconsistent meals/insulin timing he still gets hypoglycemia into 60s.  His point-of-care A1c is 10.5% unchanged from his last visit A1c of 10.2%.   Overall, he has gained 9 pounds of weight which is a good development for him.   ) An ACE inhibitor/angiotensin II receptor blocker is not being taken. Eye exam is current.  Thyroid Problem Presents for initial visit. Onset time: Unknown duration and circumstance of diagnosis with hypothyroidism. Patient reports no constipation, diarrhea, fatigue or palpitations. Past treatments include levothyroxine.     Review of Systems  Constitutional: Negative for chills, fatigue, fever and unexpected weight change.  HENT: Negative for dental problem, mouth sores and trouble swallowing.   Eyes: Negative for visual disturbance.  Respiratory: Negative for cough, choking, chest tightness, shortness of breath and wheezing.   Cardiovascular: Negative for chest pain, palpitations and leg swelling.  Gastrointestinal: Negative for abdominal distention, abdominal pain, constipation, diarrhea, nausea and vomiting.  Endocrine: Negative for polydipsia, polyphagia and polyuria.  Genitourinary: Negative for dysuria, flank pain, hematuria and urgency.  Musculoskeletal: Positive for gait problem. Negative for back pain, myalgias and neck pain.       Patient is known to have disequilibrium, syncope/presyncope currently on fludrocortisone, midodrine.  Skin: Negative for pallor, rash and wound.  Neurological: Negative for seizures, syncope, weakness, numbness and headaches.  Psychiatric/Behavioral: Negative for confusion and dysphoric mood.    Objective:    Vitals with BMI 09/26/2020 09/19/2020 09/01/2020  Height 5\' 8"  5\' 8"  -  Weight 152 lbs 10 oz 152 lbs -  BMI 23.21 23.12 -   Systolic 112 180 409130  Diastolic 68 97 69  Pulse 76 70 59    BP 112/68   Pulse 76   Ht 5\' 8"  (1.727 m)   Wt 152 lb 9.6 oz (69.2 kg)   BMI 23.20 kg/m   Wt Readings from Last 3 Encounters:  09/26/20 152 lb 9.6 oz (69.2 kg)  09/19/20 152 lb (68.9 kg)  07/24/20 144 lb (65.3 kg)  Physical Exam Constitutional:      General: He is not in acute distress.    Appearance: He is well-developed.  HENT:     Head: Normocephalic and atraumatic.  Neck:     Thyroid: No thyromegaly.     Trachea: No tracheal deviation.  Cardiovascular:     Rate and Rhythm: Normal rate.     Pulses:          Dorsalis pedis pulses are 1+ on the right side and 1+ on the left side.       Posterior tibial pulses are 1+ on the right side and 1+ on the left side.     Heart sounds: Normal heart sounds, S1 normal and S2 normal. No murmur heard. No gallop.   Pulmonary:     Effort: Pulmonary effort is normal. No respiratory distress.     Breath sounds: Normal breath sounds. No wheezing.  Abdominal:     General: Abdomen is flat. There is no distension.     Palpations: Abdomen is soft.     Tenderness: There is no abdominal tenderness. There is no guarding.  Musculoskeletal:     Right shoulder: No swelling or deformity.     Cervical back: Normal range of motion and neck supple.  Skin:    General: Skin is warm and dry.     Findings: No rash.     Nails: There is no clubbing.  Neurological:     Mental Status: He is alert and oriented to person, place, and time.     Cranial Nerves: No cranial nerve deficit.     Sensory: No sensory deficit.     Gait: Gait normal.     Deep Tendon Reflexes: Reflexes are normal and symmetric.  Psychiatric:        Speech: Speech normal.        Behavior: Behavior normal. Behavior is cooperative.        Thought Content: Thought content normal.        Judgment: Judgment normal.     Comments: Patient has significant cognitive deficit.  He is accompanied by his power of attorney and  nursing home aide.  He answers simple questions.     CMP ( most recent) CMP     Component Value Date/Time   NA 135 08/31/2020 2336   K 3.7 08/31/2020 2336   CL 104 08/31/2020 2336   CO2 25 08/31/2020 2336   GLUCOSE 116 (H) 08/31/2020 2336   BUN 23 08/31/2020 2336   BUN 23 (A) 08/24/2020 0000   CREATININE 0.99 08/31/2020 2336   CALCIUM 8.8 (L) 08/31/2020 2336   PROT 5.9 (L) 08/31/2020 2336   ALBUMIN 3.3 (L) 08/31/2020 2336   AST 22 08/31/2020 2336   ALT 22 08/31/2020 2336   ALKPHOS 47 08/31/2020 2336   BILITOT 0.4 08/31/2020 2336   GFRNONAA >60 08/31/2020 2336   GFRAA 100 08/24/2020 0000     Diabetic Labs (most recent): Lab Results  Component Value Date   HGBA1C 10.2 09/12/2020   HGBA1C 10.2 06/14/2020   HGBA1C 8.9 (H) 02/02/2020    Assessment & Plan:   1. 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  Patient resides in the [facility] nursing home for the last 11 years.  He has a past history of heavy alcohol use which likely triggered the diabetes and exocrine pancreatic insufficiency.  Accompanied by his power of attorney and nursing home  staff, he presents with his logs showing much less hypoglycemic episodes.  Due to some inconsistent meals/insulin timing he still gets hypoglycemia into 60s.  His point-of-care A1c is 10.5% unchanged from his last visit A1c of 10.2%.   Overall, he has gained 9 pounds of weight which is a good development for him.  He is accompanied by his power of attorney and nursing home aide.  Recent labs reviewed. - I had a long discussion with him and his company about the  nature of his diabetes and the pathology behind its complications.  -He is at risk of hypoglycemia due to lack of endogenous glucagon response from destruction of the alpha cells in addition to the beta cells. 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 These are all discussed in detail with him.  -He would benefit from more introduction of unprocessed or complex starch in lieu of   processed carbohydrates such as soda, sweet tea, and ice cream.   - I have approached him with the following individualized plan to manage  his diabetes and patient agrees:   -He has very limited options to treat his diabetes.  He is not a candidate for Metformin, or incretin therapy. -He will continue to need intensive treatment with basal/bolus insulin in order for him to achieve and maintain control of diabetes to target.    -Accordingly, for overnight safety, I advised to continue Lantus the same at 30 units nightly, adjust Humalog at 7 units 3 times a day with meals  for pre-meal BG readings of 80-150mg /dl, plus patient specific correction dose for unexpected hyperglycemia above 150mg /dl, associated with strict monitoring of glucose 4 times a day-before meals and at bedtime.  Maximum Humalog will be 10 units 3 times daily AC.  - he is warned not to take insulin without proper monitoring per orders. - Adjustment parameters are given to him for hypo and hyperglycemia in writing. - he is encouraged to call clinic for blood glucose levels less than 70 or above 300 mg /dl. -I advised discontinuation of his Metformin and Januvia.  - Specific targets for  A1c;  LDL, HDL,  and Triglycerides were discussed with the patient.  2) Blood Pressure /Hypertension: His blood pressure is controlled to target.  Evidently, he did not tolerate blood pressure medications.  He has a tendency for presyncope/syncope, currently on midodrine and fludrocortisone.  He will be evaluated for adrenal insufficiency on subsequent visits.   3) Lipids/Hyperlipidemia: No recent lipid panel to  review.  He is currently on ongoing treatment with Lipitor 40 mg p.o. nightly.   Advised to continue.     4)  Weight/Diet:  Body mass index is 23.2 kg/m.  -He has gained 9 pounds overall.  He will benefit from some more weight gain.  he is not a candidate for weight loss.  He walks with a cane, fall risk is high.  Precautions discussed with him.   5) hypothyroidism: Duration and circumstance of diagnosis are not available to review.  His recent thyroid function tests are consistent with appropriate replacement he is advised to continue levothyroxine 125 mcg p.o. daily before breakfast.   - We discussed about the correct intake of his thyroid hormone, on empty stomach at fasting, with water, separated by at least 30 minutes from breakfast and other medications,  and  separated by more than 4 hours from calcium, iron, multivitamins, acid reflux medications (PPIs). -Patient is made aware of the fact that thyroid hormone replacement is needed for life, dose to be adjusted by periodic monitoring of thyroid function tests.  6) exocrine pancreatic insufficiency -Likely related to his past history of heavy alcohol consumption. He is currently on Creon 36,000 units- - 2 capsules with meals and 1 capsule with snacks.  He is advised to continue.  7) Chronic Care/Health Maintenance:  -he  Is on  Statin medications and  is encouraged to initiate and continue to follow up with Ophthalmology, Dentist,  Podiatrist at least yearly or according to recommendations, and advised to   stay away from smoking. I have recommended yearly flu vaccine and pneumonia vaccine at least every 5 years; moderate intensity exercise for up to 150 minutes weekly; and  sleep for at least 7 hours a day. His screening ABI was normal on July 24, 2020. This study will be repeated in 5 years, or sooner if needed.   - he is  advised to maintain close follow up with Colon Branch, MD for primary care needs, as well as his other  providers for optimal and coordinated care.   - Time spent on this patient care encounter:  40 min, of which > 50% was spent in  counseling and the rest reviewing his blood glucose logs , discussing his hypoglycemia and hyperglycemia episodes, reviewing his current and  previous labs / studies  ( including abstraction from other facilities) and medications  doses and developing a  long term treatment plan and documenting his care.   Please refer to Patient Instructions for Blood Glucose Monitoring and Insulin/Medications Dosing Guide"  in media tab for additional information. Please  also refer to " Patient Self Inventory" in the Media  tab for reviewed elements of pertinent patient history.  Gray Bowen participated in the discussions, expressed understanding, and voiced agreement with the above plans.  All questions were answered to his satisfaction. he is encouraged to contact clinic should he have any questions or concerns prior to his return visit.   Follow up plan: - Return in about 4 weeks (around 10/24/2020) for F/U with Meter and Logs Only - no Labs.  Marquis Lunch, MD [facility] [address] [place], [place] [address] Phone: [phone]  Fax: [phone]    09/26/2020, 11:28 AM  This note was partially dictated with voice recognition software. Similar sounding words can be transcribed inadequately or may not  be corrected upon review.

## 2020-09-27 ENCOUNTER — Ambulatory Visit: Payer: Medicare Other | Admitting: Urology

## 2020-09-27 DIAGNOSIS — N200 Calculus of kidney: Secondary | ICD-10-CM

## 2020-09-27 NOTE — Telephone Encounter (Signed)
Faxed orders to [facility].

## 2020-09-27 NOTE — Telephone Encounter (Signed)
[name] should be on Lantus 30 units qhs, Humalog 7-10 units TIDAC.

## 2020-09-29 ENCOUNTER — Other Ambulatory Visit: Payer: Self-pay

## 2020-09-29 ENCOUNTER — Ambulatory Visit (HOSPITAL_COMMUNITY)
Admission: RE | Admit: 2020-09-29 | Discharge: 2020-09-29 | Disposition: A | Discharge status: Home / Self Care | Payer: Medicare Other | Source: Ambulatory Visit | Attending: Urology | Admitting: Urology

## 2020-09-29 DIAGNOSIS — N200 Calculus of kidney: Secondary | ICD-10-CM | POA: Diagnosis not present

## 2020-10-17 NOTE — Progress Notes (Signed)
Set via mail

## 2020-10-20 ENCOUNTER — Ambulatory Visit: Payer: Medicare Other | Admitting: Urology

## 2020-10-24 ENCOUNTER — Encounter: Payer: Self-pay | Admitting: "Endocrinology

## 2020-10-24 ENCOUNTER — Ambulatory Visit (INDEPENDENT_AMBULATORY_CARE_PROVIDER_SITE_OTHER): Payer: Medicare Other | Admitting: "Endocrinology

## 2020-10-24 ENCOUNTER — Other Ambulatory Visit: Payer: Self-pay

## 2020-10-24 VITALS — BP 138/76 | HR 80 | Ht 68.0 in | Wt 154.0 lb

## 2020-10-24 DIAGNOSIS — E039 Hypothyroidism, unspecified: Secondary | ICD-10-CM

## 2020-10-24 DIAGNOSIS — E089 Diabetes mellitus due to underlying condition without complications: Secondary | ICD-10-CM

## 2020-10-24 DIAGNOSIS — K8681 Exocrine pancreatic insufficiency: Secondary | ICD-10-CM

## 2020-10-24 DIAGNOSIS — K8689 Other specified diseases of pancreas: Secondary | ICD-10-CM

## 2020-10-24 NOTE — Progress Notes (Signed)
10/24/2020, 12:51 PM  Endocrinology follow-up note   Subjective:    Patient ID: Joseph Bowen, male    DOB: Jul 04, 1946.  [name] is being seen in follow-up after he was seen in consultation for management of currently uncontrolled symptomatic diabetes requested by  Colon Branch, MD. Patient was accompanied by his power of attorney [name] and nursing home aide.  Past Medical History:  Diagnosis Date  . Alcohol-induced persisting amnestic disorder (HCC)   . Anemia   . Anxiety   . Atherosclerosis of arteries   . Benign prostatic hypertrophy   . Chronic pancreatitis (HCC)   . Constipation   . COPD (chronic obstructive pulmonary disease) (HCC)   . Dementia (HCC)   . Dementia (HCC)    due to encephalopathy from diabetic coma 2011.  . Depression   . Difficulty walking   . Dizziness and giddiness   . 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  . Essential hypertension   . Euthyroid sick syndrome   . Glaucoma   . H/O ETOH abuse   . History of falling   . Hydrocele   . Hypoglycemia   . Hypothyroidism   . Insomnia   . Kidney stone   . Legally blind   . Orthostatic hypotension   . Pain in left shoulder   . Personal history of alcoholism (HCC)   . Post traumatic stress disorder   . Psychosis (HCC)   . Radiculopathy, cervical region   . Retinopathy   . Sleep terrors (night terrors)   . Syncope and collapse   . Type 2 diabetes mellitus (HCC)   . Urinary incontinence   . Vitamin B deficiency   . Vitamin D deficiency     Past Surgical History:  Procedure Laterality Date  . BACK SURGERY     removal of melanoma of back.  . 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 COLONOSCOPY WITH PROPOFOL N/A 08/15/2015   Dr. Darrick Penna: left colon redundant, pediatric scope used, sigmoid colon woult not adequately insufflate, moderate diverticulosis throughout entire colon, moderate sized IH   .  EXTRACORPOREAL SHOCK WAVE LITHOTRIPSY Right 11/18/2016   Procedure: RIGHT EXTRACORPOREAL SHOCK WAVE LITHOTRIPSY (ESWL);  Surgeon: Barron Alvine, MD;  Location: [facility];  Service: Urology;  Laterality: Right;  . HAND SURGERY Right    middle finger; fractue  . HEMORRHOID SURGERY    . HERNIA REPAIR Left    inguinal  . HIP SURGERY Bilateral    bilateral hip fractures  . MOHS SURGERY     Melanoma    Social History   Socioeconomic History  . Marital status: Divorced    Spouse name: Not on file  . Number of children: Not on file  . Years of education: Not on file  . Highest education level: Not on file  Occupational History  . Occupation: retired  Tobacco Use  . Smoking status: Former Smoker    Packs/day: 1.50    Years: 48.00    Pack years: 72.00    Types: Cigarettes    Start date: 09/17/1961    Quit date: 09/17/2009    Years since quitting: 11.1  . Smokeless tobacco: Never Used  Vaping Use  . Vaping Use: Never used  Substance and Sexual Activity  . Alcohol use: No    Alcohol/week: 0.0 standard drinks    Comment: hx of alcoholism-last used 2011  . Drug  use: No  . Sexual activity: Never    Birth control/protection: None  Other Topics Concern  . Not on file  Social History Narrative  . Not on file   Social Determinants of Health   Financial Resource Strain: Not on file  Food Insecurity: Not on file  Transportation Needs: Not on file  Physical Activity: Not on file  Stress: Not on file  Social Connections: Not on file    Family History  Problem Relation Age of Onset  . Heart disease Other   . Diabetes Other     Outpatient Encounter Medications as of 10/24/2020  Medication Sig  . atorvastatin (LIPITOR) 20 MG tablet Take 20 mg by mouth daily.  . Cholecalciferol (VITAMIN D3) 1.25 MG (50000 UT) TABS Take 1 tablet by mouth once a week.  . Cyanocobalamin (VITAMIN B 12 PO) Take 1,000 mcg by mouth daily.  . divalproex (DEPAKOTE SPRINKLE) 125 MG capsule Take 250 mg by mouth 2  (two) times daily as needed.  . docusate sodium (COLACE) 100 MG capsule Take 100 mg by mouth daily as needed for mild constipation.  . ferrous sulfate 325 (65 FE) MG tablet Take 325 mg by mouth daily with breakfast. (Patient not taking: Reported on 10/24/2020)  . gabapentin (NEURONTIN) 400 MG capsule Take 400 mg by mouth 3 (three) times daily.  . haloperidol (HALDOL) 5 MG tablet Take 5 mg by mouth at bedtime.  Marland Kitchen HUMALOG 100 UNIT/ML injection Inject 7-10 Units into the skin 3 (three) times daily before meals.  . insulin glargine (LANTUS) 100 UNIT/ML injection Inject 0.3 mLs (30 Units total) into the skin daily.  Marland Kitchen levothyroxine (SYNTHROID) 125 MCG tablet Take 125 mcg by mouth daily.  . lipase/protease/amylase (CREON) 36000 UNITS CPEP capsule Take 2 capsule with meals and 1 capsule with snacks total of 8 daily.  Marland Kitchen LORazepam (ATIVAN) 0.5 MG tablet Take 1 tablet (0.5 mg total) by mouth 2 (two) times daily. (Patient taking differently: Take 0.5 mg by mouth every 6 (six) hours as needed.)  . melatonin 3 MG TABS tablet Take 3 mg by mouth at bedtime.  . memantine (NAMENDA) 5 MG tablet Take 5 mg by mouth daily.  . midodrine (PROAMATINE) 5 MG tablet Take 5 mg by mouth daily as needed. (Patient not taking: Reported on 09/26/2020)  . mirtazapine (REMERON) 7.5 MG tablet Take 7.5 mg by mouth at bedtime.  Marland Kitchen omeprazole (PRILOSEC) 20 MG capsule Take 20 mg by mouth daily.  Bertram Gala Glycol-Propyl Glycol (SYSTANE) 0.4-0.3 % SOLN Apply 1 drop to eye 2 (two) times daily.  . prazosin (MINIPRESS) 1 MG capsule Take 1 mg by mouth at bedtime.  . sennosides-docusate sodium (SENOKOT-S) 8.6-50 MG tablet Take 1 tablet by mouth 2 (two) times daily.  . sertraline (ZOLOFT) 50 MG tablet Take 75 mg by mouth daily.  . tamsulosin (FLOMAX) 0.4 MG CAPS capsule Take 0.4 mg by mouth.  . thiamine 100 MG tablet Take 100 mg by mouth daily.  . traZODone (DESYREL) 50 MG tablet Take 50 mg by mouth at bedtime.   No facility-administered  encounter medications on file as of 10/24/2020.    ALLERGIES: No Known Allergies  VACCINATION STATUS:  There is no immunization history on file for this patient.  Diabetes He presents for his follow-up diabetic visit. Diabetes type: His diabetes is likely induced by pancreatic insufficiency related to alcohol history. Onset time: He stayed in [facility] nursing home for the last 11 years, came with a diagnosis of diabetes.  His disease course has been improving. There are no hypoglycemic associated symptoms. Pertinent negatives for hypoglycemia include no confusion, headaches, pallor or seizures. There are no diabetic associated symptoms. Pertinent negatives for diabetes include no chest pain, no fatigue, no polydipsia, no polyphagia, no polyuria and no weakness. There are no hypoglycemic complications. Symptoms are improving. Risk factors for coronary artery disease include diabetes mellitus, dyslipidemia, hypertension, tobacco exposure, male sex and sedentary lifestyle (His diabetes is complicated by pancreatic insufficiency, history of encephalopathy, retinopathy, autonomic dysfunction with disequilibrium.). Current diabetic treatments: Is currently on Metformin 1000 mg p.o. twice daily, Januvia 100 mg p.o. daily, Lantus 28 units nightly, Humalog 2-12 units 4 times a day.  Compliance with diabetes treatment: He is 100% dependent on others for care. His weight is increasing steadily (Patient does not remember how much his routine weight has been.  However, he was found to be under 100 pounds during admission to nursing home.  He is currently at 152 pounds.). He is following a generally unhealthy diet. He has not had a previous visit with a dietitian. He never participates in exercise. Blood glucose monitoring compliance is adequate. His home blood glucose trend is fluctuating minimally. His breakfast blood glucose range is generally 140-180 mg/dl. His lunch blood glucose range is generally 180-200  mg/dl. His dinner blood glucose range is generally 180-200 mg/dl. His bedtime blood glucose range is generally 180-200 mg/dl. His overall blood glucose range is 180-200 mg/dl. (He returns from nursing home with improved glycemic profile.  He has no major hypoglycemia.  His recent A1c was 10.2%.  He continues to gain weight appropriately.   He is tolerating his current regimen of basal/bolus insulin.   ) An ACE inhibitor/angiotensin II receptor blocker is not being taken. Eye exam is current.  Thyroid Problem Presents for initial visit. Onset time: Unknown duration and circumstance of diagnosis with hypothyroidism. Patient reports no constipation, diarrhea, fatigue or palpitations. Past treatments include levothyroxine.     Review of Systems  Constitutional: Negative for chills, fatigue, fever and unexpected weight change.  HENT: Negative for dental problem, mouth sores and trouble swallowing.   Eyes: Negative for visual disturbance.  Respiratory: Negative for cough, choking, chest tightness, shortness of breath and wheezing.   Cardiovascular: Negative for chest pain, palpitations and leg swelling.  Gastrointestinal: Negative for abdominal distention, abdominal pain, constipation, diarrhea, nausea and vomiting.  Endocrine: Negative for polydipsia, polyphagia and polyuria.  Genitourinary: Negative for dysuria, flank pain, hematuria and urgency.  Musculoskeletal: Positive for gait problem. Negative for back pain, myalgias and neck pain.       Patient is known to have disequilibrium, syncope/presyncope currently on fludrocortisone, midodrine.  Skin: Negative for pallor, rash and wound.  Neurological: Negative for seizures, syncope, weakness, numbness and headaches.  Psychiatric/Behavioral: Negative for confusion and dysphoric mood.    Objective:    Vitals with BMI 10/24/2020 09/26/2020 09/19/2020  Height 5\' 8"  5\' 8"  5\' 8"   Weight 154 lbs 152 lbs 10 oz 152 lbs  BMI 23.42 23.21 23.12  Systolic  138 112 180  Diastolic 76 68 97  Pulse 80 76 70    BP 138/76   Pulse 80   Ht 5\' 8"  (1.727 m)   Wt 154 lb (69.9 kg)   BMI 23.42 kg/m   Wt Readings from Last 3 Encounters:  10/24/20 154 lb (69.9 kg)  09/26/20 152 lb 9.6 oz (69.2 kg)  09/19/20 152 lb (68.9 kg)     Physical Exam Constitutional:  General: He is not in acute distress.    Appearance: He is well-developed.  HENT:     Head: Normocephalic and atraumatic.  Neck:     Thyroid: No thyromegaly.     Trachea: No tracheal deviation.  Cardiovascular:     Rate and Rhythm: Normal rate.     Pulses:          Dorsalis pedis pulses are 1+ on the right side and 1+ on the left side.       Posterior tibial pulses are 1+ on the right side and 1+ on the left side.     Heart sounds: Normal heart sounds, S1 normal and S2 normal. No murmur heard. No gallop.   Pulmonary:     Effort: Pulmonary effort is normal. No respiratory distress.     Breath sounds: Normal breath sounds. No wheezing.  Abdominal:     General: Abdomen is flat. There is no distension.     Palpations: Abdomen is soft.     Tenderness: There is no abdominal tenderness. There is no guarding.  Musculoskeletal:     Right shoulder: No swelling or deformity.     Cervical back: Normal range of motion and neck supple.  Skin:    General: Skin is warm and dry.     Findings: No rash.     Nails: There is no clubbing.  Neurological:     Mental Status: He is alert and oriented to person, place, and time.     Cranial Nerves: No cranial nerve deficit.     Sensory: No sensory deficit.     Gait: Gait normal.     Deep Tendon Reflexes: Reflexes are normal and symmetric.  Psychiatric:        Speech: Speech normal.        Behavior: Behavior normal. Behavior is cooperative.        Thought Content: Thought content normal.        Judgment: Judgment normal.     Comments: Patient has significant cognitive deficit.  He is accompanied by his power of attorney and nursing home aide.   He answers simple questions.     CMP ( most recent) CMP     Component Value Date/Time   NA 135 08/31/2020 2336   K 3.7 08/31/2020 2336   CL 104 08/31/2020 2336   CO2 25 08/31/2020 2336   GLUCOSE 116 (H) 08/31/2020 2336   BUN 23 08/31/2020 2336   BUN 23 (A) 08/24/2020 0000   CREATININE 0.99 08/31/2020 2336   CALCIUM 8.8 (L) 08/31/2020 2336   PROT 5.9 (L) 08/31/2020 2336   ALBUMIN 3.3 (L) 08/31/2020 2336   AST 22 08/31/2020 2336   ALT 22 08/31/2020 2336   ALKPHOS 47 08/31/2020 2336   BILITOT 0.4 08/31/2020 2336   GFRNONAA >60 08/31/2020 2336   GFRAA 100 08/24/2020 0000     Diabetic Labs (most recent): Lab Results  Component Value Date   HGBA1C 10.2 09/12/2020   HGBA1C 10.2 06/14/2020   HGBA1C 8.9 (H) 02/02/2020    Assessment & Plan:   1. 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  [facility].  He has a past history of heavy alcohol use which likely triggered the diabetes and exocrine pancreatic insufficiency.  He returns accompanied by his aide from nursing home with improved glycemic profile.  He has no  major hypoglycemia.  His recent A1c was 10.2%.  He continues to gain weight appropriately.   He is tolerating his current regimen of basal/bolus insulin.   He is accompanied by his power of attorney and nursing home aide.  Recent labs reviewed. - I had a long discussion with him and his company about the  nature of his diabetes and the pathology behind its complications.  -He is at risk of hypoglycemia due to lack of endogenous glucagon response from destruction of the alpha cells in addition to the beta cells. -his diabetes is complicated by cognitive deficit from Warnicke's encephalopathy, a tendency to fall from disequilibrium, and he remains at a high risk for more acute and chronic  complications which include CAD, CVA, CKD, retinopathy, and neuropathy. These are all discussed in detail with him.  -He would benefit from more introduction of unprocessed or complex starch in lieu of   processed carbohydrates such as soda, sweet tea, and ice cream.   - I have approached him with the following individualized plan to manage  his diabetes and patient agrees:   -He has limited options to treat his diabetes.  He is not a candidate for Metformin, or incretin therapy. -He will continue to need intensive treatment with basal/bolus insulin in order for him to achieve and maintain control of diabetes to target.    -Accordingly, I advised to continue Lantus 30 units nightly, continue Humalog at 7 units 3 times a day with meals  for pre-meal BG readings of 80-150mg /dl, plus patient specific correction dose for unexpected hyperglycemia above 150mg /dl, associated with strict monitoring of glucose 4 times a day-before meals and at bedtime.  Maximum Humalog will be 10 units 3 times daily AC.  - he is warned not to take insulin without proper monitoring per orders. - Adjustment parameters are given to him for hypo and hyperglycemia in writing. - he is encouraged to call clinic for blood glucose levels less than 70 or above 300 mg /dl. -He was taken off of Metformin and Januvia during his prior visits.   - Specific targets for  A1c;  LDL, HDL,  and Triglycerides were discussed with the patient.  2) Blood Pressure /Hypertension: His blood pressure is controlled to target.  Evidently, he did not tolerate blood pressure medications.  He has a tendency for presyncope/syncope, currently on midodrine and fludrocortisone.  He will be evaluated for adrenal insufficiency on subsequent visits.   3) Lipids/Hyperlipidemia: No recent lipid panel to review.  He is currently on Lipitor 40 mg p.o. nightly.  He is advised to continue.     4)  Weight/Diet:  Body mass index is 23.42 kg/m.  -He has gained  10+  pounds overall.  He will benefit from some more weight gain.  he is not a candidate for weight loss.  He walks with a cane, fall risk is high.  Precautions discussed with him.   5) hypothyroidism: Duration and circumstance of diagnosis are not available to review.  His recent thyroid function tests are consistent with appropriate replacement he is advised to continue levothyroxine 125 mcg p.o. daily before breakfast.   - We discussed about the correct intake of his thyroid hormone, on empty stomach at fasting, with water, separated by at least 30 minutes from breakfast and other medications,  and separated by more than 4 hours from calcium, iron, multivitamins, acid reflux medications (PPIs). -Patient is made aware of the fact that thyroid hormone replacement is needed for life, dose to  be adjusted by periodic monitoring of thyroid function tests.   6) exocrine pancreatic insufficiency -Likely related to his past history of heavy alcohol consumption.   He is currently on Creon 36,000 units- - 2 capsules with meals and 1 capsule with snacks.  He is advised to continue.  7) Chronic Care/Health Maintenance:  -he  Is on  Statin medications and  is encouraged to initiate and continue to follow up with Ophthalmology, Dentist,  Podiatrist at least yearly or according to recommendations, and advised to   stay away from smoking. I have recommended yearly flu vaccine and pneumonia vaccine at least every 5 years; moderate intensity exercise for up to 150 minutes weekly; and  sleep for at least 7 hours a day. His screening ABI was normal on July 24, 2020. This study will be repeated in 5 years, or sooner if needed.   - he is  advised to maintain close follow up with Colon Branch, MD for primary care needs, as well as his other providers for optimal and coordinated care.   I spent 48 minutes in the care of the patient today including review of labs from CMP, Lipids, Thyroid Function, Hematology  (current and previous including abstractions from other facilities); face-to-face time discussing  his blood glucose readings/logs, discussing hypoglycemia and hyperglycemia episodes and symptoms, medications doses, his options of short and long term treatment based on the latest standards of care / guidelines;  discussion about incorporating lifestyle medicine;  and documenting the encounter.  During this visit, we have addressed pancreatic diabetes, exocrine thyroid insufficiency, hypertension, hyperlipidemia.   Please refer to Patient Instructions for Blood Glucose Monitoring and Insulin/Medications Dosing Guide"  in media tab for additional information. Please  also refer to " Patient Self Inventory" in the Media  tab for reviewed elements of pertinent patient history.  Gray Bernhardt and his aide participated in the discussions, expressed understanding, and voiced agreement with the above plans.  All questions were answered to his satisfaction. he is encouraged to contact clinic should he have any questions or concerns prior to his return visit.    Follow up plan: - Return in about 10 weeks (around 01/02/2021) for Bring Meter and Logs- A1c in Office.  Marquis Lunch, MD [facility] [address] [place], [place] [address] Phone: [phone]  Fax: [phone]    10/24/2020, 12:51 PM  This note was partially dictated with voice recognition software. Similar sounding words can be transcribed inadequately or may not  be corrected upon review.

## 2020-12-25 ENCOUNTER — Other Ambulatory Visit: Payer: Self-pay

## 2020-12-25 ENCOUNTER — Telehealth (INDEPENDENT_AMBULATORY_CARE_PROVIDER_SITE_OTHER): Payer: Medicare Other | Admitting: Urology

## 2020-12-25 NOTE — Progress Notes (Signed)
Patient rescheduled

## 2021-01-02 ENCOUNTER — Ambulatory Visit (INDEPENDENT_AMBULATORY_CARE_PROVIDER_SITE_OTHER): Payer: Medicare Other | Admitting: "Endocrinology

## 2021-01-02 ENCOUNTER — Other Ambulatory Visit: Payer: Self-pay

## 2021-01-02 ENCOUNTER — Encounter: Payer: Self-pay | Admitting: "Endocrinology

## 2021-01-02 VITALS — BP 106/64 | HR 64 | Ht 68.0 in | Wt 152.1 lb

## 2021-01-02 DIAGNOSIS — K8689 Other specified diseases of pancreas: Secondary | ICD-10-CM

## 2021-01-02 DIAGNOSIS — E039 Hypothyroidism, unspecified: Secondary | ICD-10-CM

## 2021-01-02 DIAGNOSIS — K8681 Exocrine pancreatic insufficiency: Secondary | ICD-10-CM

## 2021-01-02 DIAGNOSIS — E782 Mixed hyperlipidemia: Secondary | ICD-10-CM

## 2021-01-02 DIAGNOSIS — E089 Diabetes mellitus due to underlying condition without complications: Secondary | ICD-10-CM

## 2021-01-02 LAB — POCT GLYCOSYLATED HEMOGLOBIN (HGB A1C): HbA1c, POC (controlled diabetic range): 9.4 % — AB (ref 0.0–7.0)

## 2021-01-02 MED ORDER — INSULIN GLARGINE 100 UNIT/ML ~~LOC~~ SOLN: 34.0000 [IU] | Freq: Every day | SUBCUTANEOUS | 2 refills | Status: DC

## 2021-01-02 NOTE — Progress Notes (Signed)
01/02/2021, 12:58 PM  Endocrinology follow-up note   Subjective:    Patient ID: Joseph Bowen, male    DOB: [birth year].  Joseph Bowen is being seen in follow-up after he was seen in consultation for management of currently uncontrolled symptomatic diabetes requested by  Colon Branch, MD. Patient was accompanied by his power of attorney [name] and nursing home aide.  Past Medical History:  Diagnosis Date  . Alcohol-induced persisting amnestic disorder (HCC)   . Anemia   . Anxiety   . Atherosclerosis of arteries   . Benign prostatic hypertrophy   . Chronic pancreatitis (HCC)   . Constipation   . COPD (chronic obstructive pulmonary disease) (HCC)   . Dementia (HCC)   . Dementia (HCC)    due to encephalopathy from diabetic coma 2011.  . Depression   . Difficulty walking   . Dizziness and giddiness   . 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  . Essential hypertension   . Euthyroid sick syndrome   . Glaucoma   . H/O ETOH abuse   . History of falling   . Hydrocele   . Hypoglycemia   . Hypothyroidism   . Insomnia   . Kidney stone   . Legally blind   . Orthostatic hypotension   . Pain in left shoulder   . Personal history of alcoholism (HCC)   . Post traumatic stress disorder   . Psychosis (HCC)   . Radiculopathy, cervical region   . Retinopathy   . Sleep terrors (night terrors)   . Syncope and collapse   . Type 2 diabetes mellitus (HCC)   . Urinary incontinence   . Vitamin B deficiency   . Vitamin D deficiency     Past Surgical History:  Procedure Laterality Date  . BACK SURGERY     removal of melanoma of back.  . 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 COLONOSCOPY WITH PROPOFOL N/A 08/15/2015   Dr. Darrick Penna: left colon redundant, pediatric scope used, sigmoid colon woult not adequately insufflate, moderate diverticulosis throughout entire colon, moderate sized IH   .  EXTRACORPOREAL SHOCK WAVE LITHOTRIPSY Right 11/18/2016   Procedure: RIGHT EXTRACORPOREAL SHOCK WAVE LITHOTRIPSY (ESWL);  Surgeon: Barron Alvine, MD;  Location: [facility];  Service: Urology;  Laterality: Right;  . HAND SURGERY Right    middle finger; fractue  . HEMORRHOID SURGERY    . HERNIA REPAIR Left    inguinal  . HIP SURGERY Bilateral    bilateral hip fractures  . MOHS SURGERY     Melanoma    Social History   Socioeconomic History  . Marital status: Divorced    Spouse name: Not on file  . Number of children: Not on file  . Years of education: Not on file  . Highest education level: Not on file  Occupational History  . Occupation: retired  Tobacco Use  . Smoking status: Former    Packs/day: 1.50    Years: 48.00    Pack years: 72.00    Types: Cigarettes    Start date: 09/17/1961    Quit date: 09/17/2009    Years since quitting: 11.3  . Smokeless tobacco: Never  Vaping Use  . Vaping Use: Never used  Substance and Sexual Activity  . Alcohol use: No    Alcohol/week: 0.0 standard drinks    Comment: hx of alcoholism-last used 2011  . Drug use: No  .  Sexual activity: Never    Birth control/protection: None  Other Topics Concern  . Not on file  Social History Narrative  . Not on file   Social Determinants of Health   Financial Resource Strain: Not on file  Food Insecurity: Not on file  Transportation Needs: Not on file  Physical Activity: Not on file  Stress: Not on file  Social Connections: Not on file    Family History  Problem Relation Age of Onset  . Heart disease Other   . Diabetes Other     Outpatient Encounter Medications as of 01/02/2021  Medication Sig  . medroxyPROGESTERone (DEPO-PROVERA) 150 MG/ML injection Inject 150 mg into the muscle every 3 (three) months.  Marland Kitchen atorvastatin (LIPITOR) 20 MG tablet Take 20 mg by mouth daily.  . Cholecalciferol (VITAMIN D3) 1.25 MG (50000 UT) TABS Take 1 tablet by mouth once a week.  . Cyanocobalamin (VITAMIN B 12 PO)  Take 1,000 mcg by mouth daily.  . divalproex (DEPAKOTE SPRINKLE) 125 MG capsule Take 250 mg by mouth 2 (two) times daily as needed.  . docusate sodium (COLACE) 100 MG capsule Take 100 mg by mouth daily as needed for mild constipation.  . ferrous sulfate 325 (65 FE) MG tablet Take 325 mg by mouth daily with breakfast. (Patient not taking: Reported on 10/24/2020)  . gabapentin (NEURONTIN) 400 MG capsule Take 400 mg by mouth 3 (three) times daily.  . haloperidol (HALDOL) 5 MG tablet Take 5 mg by mouth at bedtime.  Marland Kitchen HUMALOG 100 UNIT/ML injection Inject 8-11 Units into the skin 3 (three) times daily before meals.  . insulin glargine (LANTUS) 100 UNIT/ML injection Inject 0.34 mLs (34 Units total) into the skin daily.  Marland Kitchen levothyroxine (SYNTHROID) 125 MCG tablet Take 125 mcg by mouth daily.  . lipase/protease/amylase (CREON) 36000 UNITS CPEP capsule Take 2 capsule with meals and 1 capsule with snacks total of 8 daily.  Marland Kitchen LORazepam (ATIVAN) 0.5 MG tablet Take 1 tablet (0.5 mg total) by mouth 2 (two) times daily. (Patient taking differently: Take 0.5 mg by mouth every 6 (six) hours as needed.)  . melatonin 3 MG TABS tablet Take 3 mg by mouth at bedtime.  . memantine (NAMENDA) 5 MG tablet Take 5 mg by mouth daily.  . midodrine (PROAMATINE) 5 MG tablet Take 5 mg by mouth daily as needed. (Patient not taking: Reported on 09/26/2020)  . mirtazapine (REMERON) 7.5 MG tablet Take 7.5 mg by mouth at bedtime.  Marland Kitchen omeprazole (PRILOSEC) 20 MG capsule Take 20 mg by mouth daily. (Patient not taking: Reported on 01/02/2021)  . Polyethyl Glycol-Propyl Glycol (SYSTANE) 0.4-0.3 % SOLN Apply 1 drop to eye 2 (two) times daily.  . prazosin (MINIPRESS) 1 MG capsule Take 1 mg by mouth at bedtime.  . sennosides-docusate sodium (SENOKOT-S) 8.6-50 MG tablet Take 1 tablet by mouth 2 (two) times daily.  . sertraline (ZOLOFT) 50 MG tablet Take 75 mg by mouth daily.  . tamsulosin (FLOMAX) 0.4 MG CAPS capsule Take 0.4 mg by mouth.  .  thiamine 100 MG tablet Take 100 mg by mouth daily.  . traZODone (DESYREL) 50 MG tablet Take 50 mg by mouth at bedtime.  . [DISCONTINUED] insulin glargine (LANTUS) 100 UNIT/ML injection Inject 0.3 mLs (30 Units total) into the skin daily.   No facility-administered encounter medications on file as of 01/02/2021.    ALLERGIES: No Known Allergies  VACCINATION STATUS:  There is no immunization history on file for this patient.  Diabetes He presents for  his follow-up diabetic visit. Diabetes type: His diabetes is likely induced by pancreatic insufficiency related to alcohol history. Onset time: He stayed in [facility] nursing home for the last 11 years, came with a diagnosis of diabetes. His disease course has been improving. There are no hypoglycemic associated symptoms. Pertinent negatives for hypoglycemia include no confusion, headaches, pallor or seizures. There are no diabetic associated symptoms. Pertinent negatives for diabetes include no chest pain, no fatigue, no polydipsia, no polyphagia, no polyuria and no weakness. There are no hypoglycemic complications. Symptoms are improving. Risk factors for coronary artery disease include diabetes mellitus, dyslipidemia, hypertension, tobacco exposure, male sex and sedentary lifestyle (His diabetes is complicated by pancreatic insufficiency, history of encephalopathy, retinopathy, autonomic dysfunction with disequilibrium.). Current diabetic treatments: Is currently on Metformin 1000 mg p.o. twice daily, Januvia 100 mg p.o. daily, Lantus 28 units nightly, Humalog 2-12 units 4 times a day.  Compliance with diabetes treatment: He is 100% dependent on others for care. His weight is fluctuating minimally (Patient does not remember how much his routine weight has been.  However, he was found to be under 100 pounds during admission to nursing home.  He is currently at 152 pounds.). He is following a generally unhealthy diet. He has not had a previous visit with  a dietitian. He never participates in exercise. Blood glucose monitoring compliance is adequate. His home blood glucose trend is decreasing steadily. His breakfast blood glucose range is generally 180-200 mg/dl. His lunch blood glucose range is generally 180-200 mg/dl. His dinner blood glucose range is generally 180-200 mg/dl. His bedtime blood glucose range is generally 180-200 mg/dl. His overall blood glucose range is 180-200 mg/dl. (He is accompanied by his aide from nursing home.  Presents with slightly improving glycemic profile.  Point-of-care A1c is 9.4% improving from 10.2%.  He did not have significant, sustained hypoglycemia.    ) An ACE inhibitor/angiotensin II receptor blocker is not being taken. Eye exam is current.  Thyroid Problem Presents for initial visit. Onset time: Unknown duration and circumstance of diagnosis with hypothyroidism. Patient reports no constipation, diarrhea, fatigue or palpitations. Past treatments include levothyroxine.    Review of Systems  Constitutional:  Negative for chills, fatigue, fever and unexpected weight change.  HENT:  Negative for dental problem, mouth sores and trouble swallowing.   Eyes:  Negative for visual disturbance.  Respiratory:  Negative for cough, choking, chest tightness, shortness of breath and wheezing.   Cardiovascular:  Negative for chest pain, palpitations and leg swelling.  Gastrointestinal:  Negative for abdominal distention, abdominal pain, constipation, diarrhea, nausea and vomiting.  Endocrine: Negative for polydipsia, polyphagia and polyuria.  Genitourinary:  Negative for dysuria, flank pain, hematuria and urgency.  Musculoskeletal:  Positive for gait problem. Negative for back pain, myalgias and neck pain.       Patient is known to have disequilibrium, syncope/presyncope currently on fludrocortisone, midodrine.  Skin:  Negative for pallor, rash and wound.  Neurological:  Negative for seizures, syncope, weakness, numbness and  headaches.  Psychiatric/Behavioral:  Negative for confusion and dysphoric mood.    Objective:    Vitals with BMI 01/02/2021 10/24/2020 09/26/2020  Height 5\' 8"  5\' 8"  5\' 8"   Weight 152 lbs 2 oz 154 lbs 152 lbs 10 oz  BMI 23.13 23.42 23.21  Systolic 106 138  Diastolic 64 76 68  Pulse 64 80 76    BP 106/64   Pulse 64   Ht 5\' 8"  (1.727 m)   Wt 152 lb 1.6  oz (69 kg)   BMI 23.13 kg/m   Wt Readings from Last 3 Encounters:  01/02/21 152 lb 1.6 oz (69 kg)  10/24/20 154 lb (69.9 kg)  09/26/20 152 lb 9.6 oz (69.2 kg)     Physical Exam Constitutional:      General: He is not in acute distress.    Appearance: He is well-developed.  HENT:     Head: Normocephalic and atraumatic.  Neck:     Thyroid: No thyromegaly.     Trachea: No tracheal deviation.  Cardiovascular:     Rate and Rhythm: Normal rate.     Pulses:          Dorsalis pedis pulses are 1+ on the right side and 1+ on the left side.       Posterior tibial pulses are 1+ on the right side and 1+ on the left side.     Heart sounds: Normal heart sounds, S1 normal and S2 normal. No murmur heard.   No gallop.  Pulmonary:     Effort: Pulmonary effort is normal. No respiratory distress.     Breath sounds: Normal breath sounds. No wheezing.  Abdominal:     General: Abdomen is flat. There is no distension.     Palpations: Abdomen is soft.     Tenderness: There is no abdominal tenderness. There is no guarding.  Musculoskeletal:     Right shoulder: No swelling or deformity.     Cervical back: Normal range of motion and neck supple.  Skin:    General: Skin is warm and dry.     Findings: No rash.     Nails: There is no clubbing.  Neurological:     Mental Status: He is alert and oriented to person, place, and time.     Cranial Nerves: No cranial nerve deficit.     Sensory: No sensory deficit.     Gait: Gait normal.     Deep Tendon Reflexes: Reflexes are normal and symmetric.  Psychiatric:        Speech: Speech normal.         Behavior: Behavior normal. Behavior is cooperative.        Thought Content: Thought content normal.        Judgment: Judgment normal.     Comments: Patient has significant cognitive deficit.  He is accompanied by his power of attorney and nursing home aide.  He answers simple questions.    CMP ( most recent) CMP     Component Value Date/Time   NA 135 08/31/2020 2336   K 3.7 08/31/2020 2336   CL 104 08/31/2020 2336   CO2 25 08/31/2020 2336   GLUCOSE 116 (H) 08/31/2020 2336   BUN 23 08/31/2020 2336   BUN 23 (A) 08/24/2020 0000   CREATININE 0.99 08/31/2020 2336   CALCIUM 8.8 (L) 08/31/2020 2336   PROT 5.9 (L) 08/31/2020 2336   ALBUMIN 3.3 (L) 08/31/2020 2336   AST 22 08/31/2020 2336   ALT 22 08/31/2020 2336   ALKPHOS 47 08/31/2020 2336   BILITOT 0.4 08/31/2020 2336   GFRNONAA >60 08/31/2020 2336   GFRAA 100 08/24/2020 0000     Diabetic Labs (most recent): Lab Results  Component Value Date   HGBA1C 9.4 (A) 01/02/2021   HGBA1C 10.2 09/12/2020   HGBA1C 10.2 06/14/2020    Assessment & Plan:   1. 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  Patient resides in the [facility] nursing home for the last 11 years.  He has a past history of heavy alcohol use which likely triggered the diabetes and exocrine pancreatic insufficiency.   He is accompanied by his aide from nursing home.  Presents with slightly improving glycemic profile.  Point-of-care A1c is 9.4% improving from 10.2%.  He did not have significant, sustained hypoglycemia.      He is accompanied by his power of attorney and nursing home aide.  Recent labs reviewed. - I had a long discussion with him and his company about the  nature of his diabetes and the pathology behind its complications.  -He is at risk of hypoglycemia due to lack of endogenous glucagon response from destruction of the alpha cells  in addition to the beta cells. 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 These are all discussed in detail with him.  -He would benefit from more introduction of unprocessed or complex starch in lieu of   processed carbohydrates such as soda, sweet tea, and ice cream.   - I have approached him with the following individualized plan to manage  his diabetes and patient agrees:   -He has limited options to treat his diabetes.  He is not a candidate for metformin, nor incretin therapy.     -He will continue to need intensive treatment with basal/bolus insulin in order for him to achieve and maintain control of diabetes to target.    -Accordingly, advised to increase Lantus to 34 units nightly, increase Humalog to 8 units 3 times a day with meals  for pre-meal BG readings of 80-150mg /dl, plus patient specific correction dose for unexpected hyperglycemia above 150mg /dl, associated with strict monitoring of glucose 4 times a day-before meals and at bedtime.  Maximum Humalog will be 10 units 3 times daily AC.  - he is warned not to take insulin without proper monitoring per orders. - Adjustment parameters are given to him for hypo and hyperglycemia in writing. - he is encouraged to call clinic for blood glucose levels less than 70 or above 300 mg /dl. -He was taken off of Metformin and Januvia during his prior visits.   - Specific targets for  A1c;  LDL, HDL,  and Triglycerides were discussed with the patient.  2) Blood Pressure /Hypertension:  His blood pressure is controlled to target.  Evidently, he did not tolerate blood pressure medications.  He has a tendency for presyncope/syncope, currently on midodrine and fludrocortisone.  He will be evaluated for adrenal insufficiency on subsequent  visits.   3) Lipids/Hyperlipidemia: He does not have recent lipid panel to review.  He is currently on Lipitor 40 mg p.o. nightly, advised to continue.     4)  Weight/Diet:  Body mass index is 23.13 kg/m.  -He has gained 10+  pounds overall.  He will benefit from some more weight gain.  he is not a candidate for weight loss.  He walks with a cane, fall risk is high.  Precautions discussed with him.   5) hypothyroidism: Duration and circumstance of diagnosis are not available to review.  His recent thyroid function tests are consistent with appropriate replacement he is advised to continue levothyroxine 125 mcg p.o. daily before breakfast.   - We discussed about the correct intake of his thyroid hormone, on empty stomach  at fasting, with water, separated by at least 30 minutes from breakfast and other medications,  and separated by more than 4 hours from calcium, iron, multivitamins, acid reflux medications (PPIs). -Patient is made aware of the fact that thyroid hormone replacement is needed for life, dose to be adjusted by periodic monitoring of thyroid function tests.    6) exocrine pancreatic insufficiency -This is likely related to his past history of heavy alcohol consumption/abuse.    He is currently on Creon 36,000 units- - 2 capsules with meals and 1 capsule with snacks.  He is advised to continue.  7) Chronic Care/Health Maintenance:  -he  Is on  Statin medications and  is encouraged to initiate and continue to follow up with Ophthalmology, Dentist,  Podiatrist at least yearly or according to recommendations, and advised to   stay away from smoking. I have recommended yearly flu vaccine and pneumonia vaccine at least every 5 years; moderate intensity exercise for up to 150 minutes weekly; and  sleep for at least 7 hours a day. His screening ABI was normal on July 24, 2020. This study will be repeated in 5 years, or sooner if needed.   - he is  advised to maintain close follow  up with Colon BranchQureshi, Ayyaz, MD for primary care needs, as well as his other providers for optimal and coordinated care.    I spent 45 minutes in the care of the patient today including review of labs from CMP, Lipids, Thyroid Function, Hematology (current and previous including abstractions from other facilities); face-to-face time discussing  his blood glucose readings/logs, discussing hypoglycemia and hyperglycemia episodes and symptoms, medications doses, his options of short and long term treatment based on the latest standards of care / guidelines;  discussion about incorporating lifestyle medicine;  and documenting the encounter.    Please refer to Patient Instructions for Blood Glucose Monitoring and Insulin/Medications Dosing Guide"  in media tab for additional information. Please  also refer to " Patient Self Inventory" in the Media  tab for reviewed elements of pertinent patient history.  Gray Bernhardtavid Andrew Ragon and his nursing home aide participated in the discussions, expressed understanding, and voiced agreement with the above plans.  All questions were answered to his satisfaction. he is encouraged to contact clinic should he have any questions or concerns prior to his return visit.     Follow up plan: - Return in about 3 months (around 04/04/2021) for F/U with Pre-visit Labs, Meter, Logs, A1c here.Marquis Lunch.  Gebre Carmel Garfield, MD [facility] [address] [place], [place] [address] Phone: [phone]  Fax: [phone]    01/02/2021, 12:58 PM  This note was partially dictated with voice recognition software. Similar sounding words can be transcribed inadequately or may not  be corrected upon review.

## 2021-01-02 NOTE — Patient Instructions (Signed)

## 2021-01-03 LAB — HEMOGLOBIN A1C: Hemoglobin A1C: 10

## 2021-01-03 LAB — LIPID PANEL
Cholesterol: 89 (ref 0–200)
HDL: 39 (ref 35–70)
LDL Cholesterol: 31
Triglycerides: 96 (ref 40–160)

## 2021-01-03 LAB — BASIC METABOLIC PANEL
BUN: 25 — AB (ref 4–21)
Creatinine: 1.1 (ref 0.6–1.3)

## 2021-01-03 LAB — TSH: TSH: 1.23 (ref 0.41–5.90)

## 2021-01-09 ENCOUNTER — Ambulatory Visit (INDEPENDENT_AMBULATORY_CARE_PROVIDER_SITE_OTHER): Payer: Medicare Other | Admitting: Urology

## 2021-01-09 ENCOUNTER — Encounter: Payer: Self-pay | Admitting: Urology

## 2021-01-09 ENCOUNTER — Other Ambulatory Visit: Payer: Self-pay

## 2021-01-09 VITALS — BP 122/75 | HR 80 | Temp 98.8°F | Wt 152.0 lb

## 2021-01-09 DIAGNOSIS — N200 Calculus of kidney: Secondary | ICD-10-CM | POA: Diagnosis not present

## 2021-01-09 LAB — URINALYSIS, ROUTINE W REFLEX MICROSCOPIC
Bilirubin, UA: NEGATIVE
Glucose, UA: NEGATIVE
Nitrite, UA: NEGATIVE
Protein,UA: NEGATIVE
RBC, UA: NEGATIVE
Specific Gravity, UA: 1.025 (ref 1.005–1.030)
Urobilinogen, Ur: 1 mg/dL (ref 0.2–1.0)
pH, UA: 6.5 (ref 5.0–7.5)

## 2021-01-09 LAB — MICROSCOPIC EXAMINATION
Bacteria, UA: NONE SEEN
Epithelial Cells (non renal): NONE SEEN /hpf (ref 0–10)
RBC, Urine: NONE SEEN /hpf (ref 0–2)
Renal Epithel, UA: NONE SEEN /hpf

## 2021-01-09 NOTE — Progress Notes (Signed)
Urological Symptom Review  Patient is experiencing the following symptoms: na    Review of Systems  Gastrointestinal (upper)  : Negative for upper GI symptoms  Gastrointestinal (lower) : Negative for lower GI symptoms  Constitutional : Negative for symptoms  Skin: Negative for skin symptoms  Eyes: Negative for eye symptoms  Ear/Nose/Throat : Negative for Ear/Nose/Throat symptoms  Hematologic/Lymphatic: Negative for Hematologic/Lymphatic symptoms  Cardiovascular : Negative for cardiovascular symptoms  Respiratory : Negative for respiratory symptoms  Endocrine: Negative for endocrine symptoms  Musculoskeletal: Negative for musculoskeletal symptoms  Neurological: Negative for neurological symptoms  Psychologic: Negative for psychiatric symptoms  

## 2021-01-16 ENCOUNTER — Encounter: Payer: Self-pay | Admitting: Urology

## 2021-01-16 NOTE — Progress Notes (Signed)
01/09/2021 8:07 AM   Gray Bernhardtavid Andrew Viveros 10/24/1945 [id number]  Referring provider: Colon BranchQureshi, Ayyaz, MD [address] [place] [id number]  nephrolithiasis   HPI: Mr Joseph CooleyLing is a 75yo here for followup for nephrolithiasis. NO stone events since last visit. No flank pain. He denies any LUTS. Renal US 3/18 shows no calculi.    PMH: Past Medical History:  Diagnosis Date   Alcohol-induced persisting amnestic disorder (HCC)    Anemia    Anxiety    Atherosclerosis of arteries    Benign prostatic hypertrophy    Chronic pancreatitis (HCC)    Constipation    COPD (chronic obstructive pulmonary disease) (HCC)    Dementia (HCC)    Dementia (HCC)    due to encephalopathy from diabetic coma 2011.   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   Essential hypertension    Euthyroid sick syndrome    Glaucoma    H/O ETOH abuse    History of falling    Hydrocele    Hypoglycemia    Hypothyroidism    Insomnia    Kidney stone    Legally blind    Orthostatic hypotension    Pain in left shoulder    Personal history of alcoholism (HCC)    Post traumatic stress disorder    Psychosis (HCC)    Radiculopathy, cervical region    Retinopathy    Sleep terrors (night terrors)    Syncope and collapse    Type 2 diabetes mellitus (HCC)    Urinary incontinence    Vitamin B deficiency    Vitamin D deficiency     Surgical History: Past Surgical History:  Procedure Laterality Date   BACK SURGERY     removal of melanoma of back.   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 Darrick PennaFields: left colon redundant, pediatric scope used, sigmoid colon woult not adequately insufflate, moderate diverticulosis throughout entire colon, moderate sized IH    EXTRACORPOREAL SHOCK WAVE LITHOTRIPSY Right 11/18/2016   Procedure: RIGHT EXTRACORPOREAL SHOCK WAVE  LITHOTRIPSY (ESWL);  Surgeon: Barron AlvineGrapey, Trinten, MD;  Location: [facility];  Service: Urology;  Laterality: Right;   HAND SURGERY Right    middle finger; fractue   HEMORRHOID SURGERY     HERNIA REPAIR Left    inguinal   HIP SURGERY Bilateral    bilateral hip fractures   MOHS SURGERY     Melanoma    Home Medications:  Allergies as of 01/09/2021   No Known Allergies      Medication List        Accurate as of January 09, 2021 11:59 PM. If you have any questions, ask your nurse or doctor.          atorvastatin 20 MG tablet Commonly known as: LIPITOR Take 20 mg by mouth daily.   benztropine 1 MG tablet Commonly known as: COGENTIN Take 1 mg by mouth daily.   divalproex 125 MG capsule Commonly known as: DEPAKOTE SPRINKLE Take 250 mg by mouth 2 (two) times daily as needed.   docusate sodium 100 MG capsule Commonly known as: COLACE Take 100 mg by mouth daily as needed for mild constipation.   ferrous sulfate 325 (65 FE) MG tablet Take 325 mg by mouth daily with breakfast.   gabapentin 400 MG capsule Commonly known  as: NEURONTIN Take 400 mg by mouth 3 (three) times daily.   haloperidol 5 MG tablet Commonly known as: HALDOL Take 5 mg by mouth at bedtime.   HumaLOG 100 UNIT/ML injection Generic drug: insulin lispro Inject 8-11 Units into the skin 3 (three) times daily before meals.   insulin glargine 100 UNIT/ML injection Commonly known as: LANTUS Inject 0.34 mLs (34 Units total) into the skin daily.   levothyroxine 125 MCG tablet Commonly known as: SYNTHROID Take 125 mcg by mouth daily.   Creon 36000 UNITS Cpep capsule Generic drug: lipase/protease/amylase Take by mouth.   lipase/protease/amylase 89211 UNITS Cpep capsule Commonly known as: CREON Take 2 capsule with meals and 1 capsule with snacks total of 8 daily.   LORazepam 0.5 MG tablet Commonly known as: ATIVAN Take 1 tablet (0.5 mg total) by mouth 2 (two) times daily. What changed:  when to take  this reasons to take this   medroxyPROGESTERone 150 MG/ML injection Commonly known as: DEPO-PROVERA Inject 150 mg into the muscle every 3 (three) months.   melatonin 3 MG Tabs tablet Take 3 mg by mouth at bedtime.   memantine 5 MG tablet Commonly known as: NAMENDA Take 5 mg by mouth daily.   midodrine 5 MG tablet Commonly known as: PROAMATINE Take 5 mg by mouth daily as needed.   mirtazapine 7.5 MG tablet Commonly known as: REMERON Take 7.5 mg by mouth at bedtime.   omeprazole 20 MG capsule Commonly known as: PRILOSEC Take 20 mg by mouth daily.   prazosin 1 MG capsule Commonly known as: MINIPRESS Take 1 mg by mouth at bedtime.   sennosides-docusate sodium 8.6-50 MG tablet Commonly known as: SENOKOT-S Take 1 tablet by mouth 2 (two) times daily.   sertraline 50 MG tablet Commonly known as: ZOLOFT Take 75 mg by mouth daily.   Systane 0.4-0.3 % Soln Generic drug: Polyethyl Glycol-Propyl Glycol Apply 1 drop to eye 2 (two) times daily.   tamsulosin 0.4 MG Caps capsule Commonly known as: FLOMAX Take 0.4 mg by mouth.   thiamine 100 MG tablet Take 100 mg by mouth daily.   traZODone 50 MG tablet Commonly known as: DESYREL Take 50 mg by mouth at bedtime.   VITAMIN B 12 PO Take 1,000 mcg by mouth daily.   Vitamin D3 1.25 MG (50000 UT) Tabs Take 1 tablet by mouth once a week.        Allergies: No Known Allergies  Family History: Family History  Problem Relation Age of Onset   Heart disease Other    Diabetes Other     Social History:  reports that he quit smoking about 11 years ago. His smoking use included cigarettes. He started smoking about 59 years ago. He has a 72.00 pack-year smoking history. He has never used smokeless tobacco. He reports that he does not drink alcohol and does not use drugs.  ROS: All other review of systems were reviewed and are negative except what is noted above in HPI  Physical Exam: BP 122/75   Pulse 80   Temp 98.8 F  (37.1 C)   Wt 152 lb (68.9 kg)   BMI 23.11 kg/m   Constitutional:  Alert and oriented, No acute distress. HEENT:  AT, moist mucus membranes.  Trachea midline, no masses. Cardiovascular: No clubbing, cyanosis, or edema. Respiratory: Normal respiratory effort, no increased work of breathing. GI: Abdomen is soft, nontender, nondistended, no abdominal masses GU: No CVA tenderness.  Lymph: No cervical or inguinal lymphadenopathy. Skin: No rashes,  bruises or suspicious lesions. Neurologic: Grossly intact, no focal deficits, moving all 4 extremities. Psychiatric: Normal mood and affect.  Laboratory Data: Lab Results  Component Value Date   WBC 6.8 08/31/2020   HGB 12.4 (L) 08/31/2020   HCT 38.7 (L) 08/31/2020   MCV 93.7 08/31/2020   PLT 181 08/31/2020    Lab Results  Component Value Date   CREATININE 0.99 08/31/2020    No results found for: PSA  No results found for: TESTOSTERONE  Lab Results  Component Value Date   HGBA1C 9.4 (A) 01/02/2021    Urinalysis    Component Value Date/Time   COLORURINE YELLOW 08/31/2020 0315   APPEARANCEUR Clear 01/09/2021 1440   LABSPEC 1.025 08/31/2020 0315   PHURINE 5.0 08/31/2020 0315   GLUCOSEU Negative 01/09/2021 1440   HGBUR NEGATIVE 08/31/2020 0315   BILIRUBINUR Negative 01/09/2021 1440   KETONESUR NEGATIVE 08/31/2020 0315   PROTEINUR Negative 01/09/2021 1440   PROTEINUR NEGATIVE 08/31/2020 0315   UROBILINOGEN 0.2 01/22/2015 1000   NITRITE Negative 01/09/2021 1440   NITRITE NEGATIVE 08/31/2020 0315   LEUKOCYTESUR Trace (A) 01/09/2021 1440   LEUKOCYTESUR NEGATIVE 08/31/2020 0315    Lab Results  Component Value Date   LABMICR See below: 01/09/2021   WBCUA 0-5 01/09/2021   LABEPIT None seen 01/09/2021   MUCUS Present 01/09/2021   BACTERIA None seen 01/09/2021    Pertinent Imaging: Renal US 09/29/2020: Images reviewed and discussed with the patient  Results for orders placed during the hospital encounter of  11/18/16  DG Abd 1 View  Narrative CLINICAL DATA:  Pre lithotripsy.  EXAM: ABDOMEN - 1 VIEW  COMPARISON:  11/08/2016  FINDINGS: Right renal calculus measuring 1.6 cm is again identified in the expected location of the inferior pole of right kidney. No additional urinary tract calculi identified. Moderate stool burden noted throughout the colon.  IMPRESSION: 1. 1.6 cm right renal calculus again noted. 2. Moderate stool burden within the colon.   Electronically Signed By: Signa Kell M.D. On: 11/18/2016 08:32  No results found for this or any previous visit.  No results found for this or any previous visit.  No results found for this or any previous visit.  Results for orders placed during the hospital encounter of 09/29/20  Ultrasound renal complete  Narrative CLINICAL DATA:  Nephrolithiasis.  EXAM: RENAL / URINARY TRACT ULTRASOUND COMPLETE  COMPARISON:  CTA abdomen and pelvis 02/02/2020. abdomen/pelvis CT 07/29/2017.  FINDINGS: Right Kidney:  Renal measurements: 11.4 x 5.8 x 4.6 cm = volume: 161 mL. Echogenicity within normal limits. No mass or hydronephrosis visualized.  Left Kidney:  Renal measurements: 11.9 x 5.4 x 5.4 cm = volume: 184 mL. 2.6 cm exophytic hypoechoic lesion upper pole right kidney compatible with 2.6 cm exophytic upper pole left renal lesion seen 07/29/2017, compatible with a cyst. A second 2.1 cm cystic lesion identified in the medial left kidney is compatible with a 1.9 cm low-density lesion seen on the previous CT from 2019.  Bladder:  Circumferential bladder wall thickening noted  Other:  Prostate gland is enlarged.  IMPRESSION: 1. No hydronephrosis. 2. 2 left renal cystic lesions are similar to CT of more than 3 years ago, compatible with benign etiology. 3. Circumferential bladder wall thickening associated with prostatomegaly. Bladder is nondistended today but component of underlying bladder outlet obstruction  not excluded.   Electronically Signed By: Kennith Center M.D. On: 10/02/2020 11:00  No results found for this or any previous visit.  No results found for  this or any previous visit.  No results found for this or any previous visit.   Assessment & Plan:    1. Kidney stones -RTC 1 year with a renal US - Urinalysis, Routine w reflex microscopic - Ultrasound renal complete; Future   Return in about 1 year (around 01/09/2022) for renal US.  Wilkie Aye, MD  [facility] [place]

## 2021-01-16 NOTE — Patient Instructions (Signed)
Textbook of Natural Medicine (5th ed., pp. 1518-1527.e3). [place], [place]: Elsevier.">  Dietary Guidelines to Help Prevent Kidney Stones Kidney stones are deposits of minerals and salts that form inside your kidneys. Your risk of developing kidney stones may be greater depending on your diet, your lifestyle, the medicines you take, and whether you have certain medical conditions. Most people can lower their chances of developing kidney stones by following the instructions below. Your dietitian may give you more specific instructions depending on your overall health and the type of kidney stones youtend to develop. What are tips for following this plan? Reading food labels  Choose foods with "no salt added" or "low-salt" labels. Limit your salt (sodium) intake to less than 1,500 mg a day. Choose foods with calcium for each meal and snack. Try to eat about 300 mg of calcium at each meal. Foods that contain 200-500 mg of calcium a serving include: 8 oz (237 mL) of milk, calcium-fortifiednon-dairy milk, and calcium-fortifiedfruit juice. Calcium-fortified means that calcium has been added to these drinks. 8 oz (237 mL) of kefir, yogurt, and soy yogurt. 4 oz (114 g) of tofu. 1 oz (28 g) of cheese. 1 cup (150 g) of dried figs. 1 cup (91 g) of cooked broccoli. One 3 oz (85 g) can of sardines or mackerel. Most people need 1,000-1,500 mg of calcium a day. Talk to your dietitian abouthow much calcium is recommended for you. Shopping Buy plenty of fresh fruits and vegetables. Most people do not need to avoid fruits and vegetables, even if these foods contain nutrients that may contribute to kidney stones. When shopping for convenience foods, choose: Whole pieces of fruit. Pre-made salads with dressing on the side. Low-fat fruit and yogurt smoothies. Avoid buying frozen meals or prepared deli foods. These can be high in sodium. Look for foods with live cultures, such as yogurt and kefir. Choose high-fiber  grains, such as whole-wheat breads, oat bran, and wheat cereals. Cooking Do not add salt to food when cooking. Place a salt shaker on the table and allow each person to add his or her own salt to taste. Use vegetable protein, such as beans, textured vegetable protein (TVP), or tofu, instead of meat in pasta, casseroles, and soups. Meal planning Eat less salt, if told by your dietitian. To do this: Avoid eating processed or pre-made food. Avoid eating fast food. Eat less animal protein, including cheese, meat, poultry, or fish, if told by your dietitian. To do this: Limit the number of times you have meat, poultry, fish, or cheese each week. Eat a diet free of meat at least 2 days a week. Eat only one serving each day of meat, poultry, fish, or seafood. When you prepare animal protein, cut pieces into small portion sizes. For most meat and fish, one serving is about the size of the palm of your hand. Eat at least five servings of fresh fruits and vegetables each day. To do this: Keep fruits and vegetables on hand for snacks. Eat one piece of fruit or a handful of berries with breakfast. Have a salad and fruit at lunch. Have two kinds of vegetables at dinner. Limit foods that are high in a substance called oxalate. These include: Spinach (cooked), rhubarb, beets, sweet potatoes, and Swiss chard. Peanuts. Potato chips, french fries, and baked potatoes with skin on. Nuts and nut products. Chocolate. If you regularly take a diuretic medicine, make sure to eat at least 1 or 2 servings of fruits or vegetables that are   high in potassium each day. These include: Avocado. Banana. Orange, prune, carrot, or tomato juice. Baked potato. Cabbage. Beans and split peas. Lifestyle  Drink enough fluid to keep your urine pale yellow. This is the most important thing you can do. Spread your fluid intake throughout the day. If you drink alcohol: Limit how much you use to: 0-1 drink a day for women who  are not pregnant. 0-2 drinks a day for men. Be aware of how much alcohol is in your drink. In the [place] one drink equals one 12 oz bottle of beer (355 mL), one 5 oz glass of wine (148 mL), or one 1 oz glass of hard liquor (44 mL). Lose weight if told by your health care provider. Work with your dietitian to find an eating plan and weight loss strategies that work best for you.  General information Talk to your health care provider and dietitian about taking daily supplements. You may be told the following depending on your health and the cause of your kidney stones: Not to take supplements with vitamin C. To take a calcium supplement. To take a daily probiotic supplement. To take other supplements such as magnesium, fish oil, or vitamin B6. Take over-the-counter and prescription medicines only as told by your health care provider. These include supplements. What foods should I limit? Limit your intake of the following foods, or eat them as told by your dietitian. Vegetables Spinach. Rhubarb. Beets. Canned vegetables. Pickles. Olives. Baked potatoeswith skin. Grains Wheat bran. Baked goods. Salted crackers. Cereals high in sugar. Meats and other proteins Nuts. Nut butters. Large portions of meat, poultry, or fish. Salted, precooked,or cured meats, such as sausages, meat loaves, and hot dogs. Dairy Cheese. Beverages Regular soft drinks. Regular vegetable juice. Seasonings and condiments Seasoning blends with salt. Salad dressings. Soy sauce. Ketchup. Barbecue sauce. Other foods Canned soups. Canned pasta sauce. Casseroles. Pizza. Lasagna. Frozen meals.Potato chips. French fries. The items listed above may not be a complete list of foods and beverages you should limit. Contact a dietitian for more information. What foods should I avoid? Talk to your dietitian about specific foods you should avoid based on the typeof kidney stones you have and your overall health. Fruits Grapefruit. The  item listed above may not be a complete list of foods and beverages you should avoid. Contact a dietitian for more information. Summary Kidney stones are deposits of minerals and salts that form inside your kidneys. You can lower your risk of kidney stones by making changes to your diet. The most important thing you can do is drink enough fluid. Drink enough fluid to keep your urine pale yellow. Talk to your dietitian about how much calcium you should have each day, and eat less salt and animal protein as told by your dietitian. This information is not intended to replace advice given to you by your health care provider. Make sure you discuss any questions you have with your healthcare provider. Document Revised: 06/24/2019 Document Reviewed: 06/24/2019 Elsevier Patient Education  2022 Elsevier Inc.  

## 2021-04-02 ENCOUNTER — Other Ambulatory Visit: Payer: Self-pay

## 2021-04-02 ENCOUNTER — Emergency Department (HOSPITAL_COMMUNITY): Payer: Medicare Other

## 2021-04-02 ENCOUNTER — Encounter (HOSPITAL_COMMUNITY): Payer: Self-pay | Admitting: *Deleted

## 2021-04-02 ENCOUNTER — Emergency Department (HOSPITAL_COMMUNITY)
Admission: EM | Admit: 2021-04-02 | Discharge: 2021-04-02 | Disposition: A | Discharge status: Home / Self Care | Payer: Medicare Other | Attending: Emergency Medicine | Admitting: Emergency Medicine

## 2021-04-02 DIAGNOSIS — R410 Disorientation, unspecified: Secondary | ICD-10-CM | POA: Insufficient documentation

## 2021-04-02 DIAGNOSIS — S2243XA Multiple fractures of ribs, bilateral, initial encounter for closed fracture: Secondary | ICD-10-CM | POA: Diagnosis not present

## 2021-04-02 DIAGNOSIS — E039 Hypothyroidism, unspecified: Secondary | ICD-10-CM | POA: Diagnosis not present

## 2021-04-02 DIAGNOSIS — Y92129 Unspecified place in nursing home as the place of occurrence of the external cause: Secondary | ICD-10-CM | POA: Insufficient documentation

## 2021-04-02 DIAGNOSIS — F0390 Unspecified dementia without behavioral disturbance: Secondary | ICD-10-CM | POA: Insufficient documentation

## 2021-04-02 DIAGNOSIS — Z794 Long term (current) use of insulin: Secondary | ICD-10-CM | POA: Diagnosis not present

## 2021-04-02 DIAGNOSIS — Z79899 Other long term (current) drug therapy: Secondary | ICD-10-CM | POA: Diagnosis not present

## 2021-04-02 DIAGNOSIS — S32019A Unspecified fracture of first lumbar vertebra, initial encounter for closed fracture: Secondary | ICD-10-CM | POA: Insufficient documentation

## 2021-04-02 DIAGNOSIS — I10 Essential (primary) hypertension: Secondary | ICD-10-CM | POA: Diagnosis not present

## 2021-04-02 DIAGNOSIS — W19XXXA Unspecified fall, initial encounter: Secondary | ICD-10-CM | POA: Insufficient documentation

## 2021-04-02 DIAGNOSIS — S299XXA Unspecified injury of thorax, initial encounter: Secondary | ICD-10-CM | POA: Diagnosis present

## 2021-04-02 DIAGNOSIS — Z87891 Personal history of nicotine dependence: Secondary | ICD-10-CM | POA: Diagnosis not present

## 2021-04-02 DIAGNOSIS — J449 Chronic obstructive pulmonary disease, unspecified: Secondary | ICD-10-CM | POA: Diagnosis not present

## 2021-04-02 NOTE — Discharge Instructions (Signed)
Call your primary care doctor or specialist as discussed in the next 2-3 days.   Return immediately back to the ER if:  Your symptoms worsen within the next 12-24 hours. You develop new symptoms such as new fevers, persistent vomiting, new pain, shortness of breath, or new weakness or numbness, or if you have any other concerns.  

## 2021-04-02 NOTE — ED Notes (Signed)
Attempted to call reports x 3 at [facility].  No answer.

## 2021-04-02 NOTE — ED Triage Notes (Signed)
Pt brought in by [facility] from [facility] with c/o pain to right side since he had a fall on 03/31/21. He was dx with a compression fx at T10-L1 frorm his fall on 03/31/21.

## 2021-04-02 NOTE — ED Provider Notes (Signed)
[facility] EMERGENCY DEPARTMENT Provider Note  CSN: [id number] Arrival date & time: 04/02/21 1247    History Chief Complaint  Patient presents with   Joseph Bowen    Jaylun Fleener is a 75 y.o. male with history of dementia brought to the ED from [facility] for evaluation of L1 and T10 compression fractures seen on outpatient xrays at his facility after a recent fall. He denies any pain to me but history is limited by dementia. Level 5 caveat applies.    Past Medical History:  Diagnosis Date   Alcohol-induced persisting amnestic disorder (HCC)    Anemia    Anxiety    Atherosclerosis of arteries    Benign prostatic hypertrophy    Chronic pancreatitis (HCC)    Constipation    COPD (chronic obstructive pulmonary disease) (HCC)    Dementia (HCC)    Dementia (HCC)    due to encephalopathy from diabetic coma 2011.   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   Essential hypertension    Euthyroid sick syndrome    Glaucoma    H/O ETOH abuse    History of falling    Hydrocele    Hypoglycemia    Hypothyroidism    Insomnia    Kidney stone    Legally blind    Orthostatic hypotension    Pain in left shoulder    Personal history of alcoholism (HCC)    Post traumatic stress disorder    Psychosis (HCC)    Radiculopathy, cervical region    Retinopathy    Sleep terrors (night terrors)    Syncope and collapse    Type 2 diabetes mellitus (HCC)    Urinary incontinence    Vitamin B deficiency    Vitamin D deficiency     Past Surgical History:  Procedure Laterality Date   BACK SURGERY     removal of melanoma of back.   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 Darrick Penna: left colon redundant, pediatric scope used, sigmoid colon woult not adequately insufflate, moderate diverticulosis throughout entire colon, moderate  sized IH    EXTRACORPOREAL SHOCK WAVE LITHOTRIPSY Right 11/18/2016   Procedure: RIGHT EXTRACORPOREAL SHOCK WAVE LITHOTRIPSY (ESWL);  Surgeon: Barron Alvine, MD;  Location: [facility];  Service: Urology;  Laterality: Right;   HAND SURGERY Right    middle finger; fractue   HEMORRHOID SURGERY     HERNIA REPAIR Left    inguinal   HIP SURGERY Bilateral    bilateral hip fractures   MOHS SURGERY     Melanoma    Family History  Problem Relation Age of Onset   Heart disease Other    Diabetes Other     Social History   Tobacco Use   Smoking status: Former    Packs/day: 1.50    Years: 48.00    Pack years: 72.00    Types: Cigarettes    Start date: 09/17/1961    Quit date: 09/17/2009    Years since quitting: 11.5   Smokeless tobacco: Never  Vaping Use   Vaping Use: Never used  Substance Use Topics   Alcohol use: No    Alcohol/week: 0.0 standard drinks    Comment: hx of alcoholism-last used 2011   Drug use: No     Home Medications Prior to Admission medications  Medication Sig Start Date End Date Taking? Authorizing Provider  atorvastatin (LIPITOR) 20 MG tablet Take 20 mg by mouth daily.    [provider]  benztropine (COGENTIN) 1 MG tablet Take 1 mg by mouth daily. 10/20/20   [provider]  Cholecalciferol (VITAMIN D3) 1.25 MG (50000 UT) TABS Take 1 tablet by mouth once a week.    [provider]  Cyanocobalamin (VITAMIN B 12 PO) Take 1,000 mcg by mouth daily.    [provider]  divalproex (DEPAKOTE SPRINKLE) 125 MG capsule Take 250 mg by mouth 2 (two) times daily as needed. 08/16/20   [provider]  docusate sodium (COLACE) 100 MG capsule Take 100 mg by mouth daily as needed for mild constipation.    [provider]  ferrous sulfate 325 (65 FE) MG tablet Take 325 mg by mouth daily with breakfast. Patient not taking: Reported on 10/24/2020    [provider]  gabapentin (NEURONTIN) 400 MG capsule Take 400 mg by mouth 3  (three) times daily.    [provider]  haloperidol (HALDOL) 5 MG tablet Take 5 mg by mouth at bedtime. 08/16/20   [provider]  HUMALOG 100 UNIT/ML injection Inject 8-11 Units into the skin 3 (three) times daily before meals. 01/31/20   [provider]  insulin glargine (LANTUS) 100 UNIT/ML injection Inject 0.34 mLs (34 Units total) into the skin daily. 01/02/21   Roma Kayser, MD  levothyroxine (SYNTHROID) 125 MCG tablet Take 125 mcg by mouth daily. 02/07/20   [provider]  lipase/protease/amylase (CREON) 36000 UNITS CPEP capsule Take 2 capsule with meals and 1 capsule with snacks total of 8 daily. 04/12/20   Tiffany Kocher, PA-C  lipase/protease/amylase (CREON) 36000 UNITS CPEP capsule Take by mouth.    [provider]  LORazepam (ATIVAN) 0.5 MG tablet Take 1 tablet (0.5 mg total) by mouth 2 (two) times daily. Patient taking differently: Take 0.5 mg by mouth every 6 (six) hours as needed. 02/04/20   Johnson, Clanford L, MD  medroxyPROGESTERone (DEPO-PROVERA) 150 MG/ML injection Inject 150 mg into the muscle every 3 (three) months.    [provider]  melatonin 3 MG TABS tablet Take 3 mg by mouth at bedtime.    [provider]  memantine (NAMENDA) 5 MG tablet Take 5 mg by mouth daily.    [provider]  midodrine (PROAMATINE) 5 MG tablet Take 5 mg by mouth daily as needed. Patient not taking: Reported on 09/26/2020    [provider]  mirtazapine (REMERON) 7.5 MG tablet Take 7.5 mg by mouth at bedtime. 02/14/20   [provider]  omeprazole (PRILOSEC) 20 MG capsule Take 20 mg by mouth daily. Patient not taking: Reported on 01/02/2021 08/18/20   [provider]  Polyethyl Glycol-Propyl Glycol (SYSTANE) 0.4-0.3 % SOLN Apply 1 drop to eye 2 (two) times daily.    [provider]  prazosin (MINIPRESS) 1 MG capsule Take 1 mg by mouth at bedtime.    [provider]   sennosides-docusate sodium (SENOKOT-S) 8.6-50 MG tablet Take 1 tablet by mouth 2 (two) times daily.    [provider]  sertraline (ZOLOFT) 50 MG tablet Take 75 mg by mouth daily.    [provider]  tamsulosin (FLOMAX) 0.4 MG CAPS capsule Take 0.4 mg by mouth.    [provider]  thiamine 100 MG tablet Take 100 mg by mouth daily.    [provider]  traZODone (DESYREL)  50 MG tablet Take 50 mg by mouth at bedtime.    [provider]     Allergies    Patient has no known allergies.   Review of Systems   Review of Systems Unable to assess due to mental status.    Physical Exam BP (!) 150/79 (BP Location: Left Arm)   Pulse 71   Temp 99.5 F (37.5 C) (Oral)   Resp 17   Ht 5\' 8"  (1.727 m)   Wt 68.9 kg   SpO2 100%   BMI 23.10 kg/m   Physical Exam Vitals and nursing note reviewed.  Constitutional:      Appearance: Normal appearance.  HENT:     Head: Normocephalic and atraumatic.     Nose: Nose normal.     Mouth/Throat:     Mouth: Mucous membranes are moist.  Eyes:     Extraocular Movements: Extraocular movements intact.     Conjunctiva/sclera: Conjunctivae normal.  Cardiovascular:     Rate and Rhythm: Normal rate.  Pulmonary:     Effort: Pulmonary effort is normal.     Breath sounds: Normal breath sounds.  Abdominal:     General: Abdomen is flat.     Palpations: Abdomen is soft.     Tenderness: There is no abdominal tenderness.  Musculoskeletal:        General: No swelling or tenderness (no midline spine tenderness). Normal range of motion.     Cervical back: Neck supple.  Skin:    General: Skin is warm and dry.  Neurological:     Mental Status: He is alert. Mental status is at baseline. He is disoriented.  Psychiatric:        Mood and Affect: Mood normal.     ED Results / Procedures / Treatments   Labs (all labs ordered are listed, but only abnormal results are displayed) Labs Reviewed - No data to  display  EKG None  Radiology CT Lumbar Spine Wo Contrast  Result Date: 04/02/2021 CLINICAL DATA:  Back pain since falling yesterday. Please evaluate compression fracture T10 and L1 on outpatient x-rays. EXAM: CT LUMBAR SPINE WITHOUT CONTRAST TECHNIQUE: Multidetector CT imaging of the lumbar spine was performed without intravenous contrast administration. Multiplanar CT image reconstructions were also generated. COMPARISON:  No recent prior studies are available. Abdominopelvic CTA 02/02/2020 and lumbar spine radiographs 02/04/2020 are correlated. FINDINGS: Segmentation: There are 5 lumbar type vertebral bodies. Alignment: Normal. Vertebrae: There is a chronic mild superior endplate compression fracture at L1 which appears unchanged from previous CT. The T10 vertebral body is included and appears normal. No acute lumbar spine fractures are seen. However, several acute lower rib fractures are noted, incompletely visualized. There is a nondisplaced fracture of the left 10th rib near the costovertebral junction (image 8/3). There are mildly displaced fractures of the right 10th and 11th ribs posteriorly. There is a small right pleural effusion. Subpleural blebs are present at both lung bases, similar to previous CT. No signs pneumothorax. Paraspinal and other soft tissues: No acute paraspinal findings. As above, small right pleural effusion. There are chronic calcifications within the pancreatic head. Diffuse aortic and branch vessel atherosclerosis noted. There are possible small nonobstructing bilateral renal calculi. Disc levels: No large disc herniation or high-grade spinal stenosis identified. There is chronic degenerative disc disease at L5-S1 with loss of disc height, annular disc bulging and endplate osteophytes. Mild facet degenerative changes are present in the lower lumbar spine. No significant foraminal narrowing. IMPRESSION: 1. No evidence of acute  lumbar spine fracture. Chronic L1 compression deformity  appears unchanged. 2. There are several acute lower rib fractures bilaterally which may be incompletely visualized. Small right pleural effusion. Recommend correlation with bilateral rib radiographs. 3. Mild chronic lumbar spondylosis without evidence of high-grade spinal stenosis. 4.  Aortic Atherosclerosis (ICD10-I70.0). Electronically Signed   By: Carey Bullocks M.D.   On: 04/02/2021 14:44    Procedures Procedures  Medications Ordered in the ED Medications - No data to display   MDM Rules/Calculators/A&P MDM I received a call from NP at SNF who requests he be taken for CT and then sent back to SNF. She does not have any other acute concerns.   ED Course  I have reviewed the triage vital signs and the nursing notes.  Pertinent labs & imaging results that were available during my care of the patient were reviewed by me and considered in my medical decision making (see chart for details).  Clinical Course as of 04/02/21 1529  Mon Apr 02, 2021  1508 CT does now show acute compression fracture. There is incompletely visualized rib fractures, will send back for further imaging per Rads recommendation.  [CS]  1529 Care of the patient signed out at the change of shift.  [CS]    Clinical Course User Index [CS] Pollyann Savoy, MD    Final Clinical Impression(s) / ED Diagnoses Final diagnoses:  Fall    Rx / DC Orders ED Discharge Orders     None        Pollyann Savoy, MD 04/02/21 1529

## 2021-04-03 ENCOUNTER — Ambulatory Visit (INDEPENDENT_AMBULATORY_CARE_PROVIDER_SITE_OTHER): Payer: Medicare Other | Admitting: Family

## 2021-04-03 ENCOUNTER — Other Ambulatory Visit: Payer: Self-pay

## 2021-04-03 ENCOUNTER — Encounter: Payer: Self-pay | Admitting: Family

## 2021-04-03 ENCOUNTER — Ambulatory Visit: Payer: Self-pay

## 2021-04-03 DIAGNOSIS — R0781 Pleurodynia: Secondary | ICD-10-CM

## 2021-04-03 MED ORDER — ACETAMINOPHEN-CODEINE #3 300-30 MG PO TABS: 1.0000 | ORAL_TABLET | Freq: Two times a day (BID) | ORAL | 0 refills | Status: AC

## 2021-04-03 MED ORDER — LIDOCAINE 4 % EX PTCH: 1.0000 | MEDICATED_PATCH | CUTANEOUS | 0 refills | Status: DC

## 2021-04-03 MED ORDER — LIDOCAINE 4 % EX PTCH: 1.0000 | MEDICATED_PATCH | CUTANEOUS | 0 refills | Status: AC

## 2021-04-03 NOTE — Progress Notes (Signed)
Office Visit Note   Patient: Joseph Bowen           Date of Birth: 04-Mar-1946           MRN: [id number] Visit Date: 04/03/2021              Requested by: Colon Branch, MD [address] [place],  [place] [address] PCP: Colon Branch, MD  Chief Complaint  Patient presents with   Lower Back - Pain      HPI: The patient is a 75 year old gentleman who presents today from [facility] nursing facility for evaluation of rib pain.  The patient had an unwitnessed fall and has been having pain with deep inspiration since.  Difficulty sleeping due to pain.  Patient does have dementia and is unable to provide history himself.  Caregiver from the facility accompany to give history.  There was some confusion yesterday he was sent to the emergency department for evaluation a CT was done the CT recommended further radiographs of the ribs.  [facility] did not realize these radiographs have been performed.  They report was not sent to the facility.  They present today for radiographs of his ribs  Radiographic evidence of compression fracture L1.  This is old.  No new spine injury. Assessment & Plan: Visit Diagnoses:  1. Rib pain on right side     Plan: Radiographs of ribs deferred today as these were performed yesterday.  Report provided to facility.  Will provide pain medication as the patient is currently taking Tylenol as needed.  Will send prescription for lidocaine patch  Follow-Up Instructions: No follow-ups on file.   Ortho Exam  Patient is alert, oriented, no adenopathy, well-dressed, normal affect, normal respiratory effort. No ecchymosis.  Tenderness to left flank and ribs.   Imaging: DG Ribs Bilateral W/Chest  Result Date: 04/02/2021 CLINICAL DATA:  Rib fracture seen on CT, additional evaluation EXAM: BILATERAL RIBS AND CHEST - 4+ VIEW COMPARISON:  CT L-spine April 02, 2021 FINDINGS: The cardiomediastinal silhouette is within normal limits. No pleural effusion. No  pneumothorax. No mass or consolidation. Metallic coils overlie the upper abdomen. Osteopenia. Dedicated views of the bilateral ribs demonstrate subtle minimally displaced right tenth and eleventh posterior rib fractures. Possible right posterior ninth rib fracture. No definite left-sided fractures; the previously described left tenth rib fracture is better visualized on recent CT. IMPRESSION: Dedicated views of the ribs again demonstrate minimally displaced fractures of the right posterior tenth and eleventh ribs, and possible minimally displaced fracture of the right posterior ninth rib. These are better seen on recent CT. The previously described left tenth rib fracture is only seen on comparison CT. Electronically Signed   By: Olive Bass M.D.   On: 04/02/2021 16:03   CT Lumbar Spine Wo Contrast  Result Date: 04/02/2021 CLINICAL DATA:  Back pain since falling yesterday. Please evaluate compression fracture T10 and L1 on outpatient x-rays. EXAM: CT LUMBAR SPINE WITHOUT CONTRAST TECHNIQUE: Multidetector CT imaging of the lumbar spine was performed without intravenous contrast administration. Multiplanar CT image reconstructions were also generated. COMPARISON:  No recent prior studies are available. Abdominopelvic CTA 02/02/2020 and lumbar spine radiographs [id number] are correlated. FINDINGS: Segmentation: There are 5 lumbar type vertebral bodies. Alignment: Normal. Vertebrae: There is a chronic mild superior endplate compression fracture at L1 which appears unchanged from previous CT. The T10 vertebral body is included and appears normal. No acute lumbar spine fractures are seen. However, several acute lower rib fractures are noted,  incompletely visualized. There is a nondisplaced fracture of the left 10th rib near the costovertebral junction (image 8/3). There are mildly displaced fractures of the right 10th and 11th ribs posteriorly. There is a small right pleural effusion. Subpleural blebs are present at  both lung bases, similar to previous CT. No signs pneumothorax. Paraspinal and other soft tissues: No acute paraspinal findings. As above, small right pleural effusion. There are chronic calcifications within the pancreatic head. Diffuse aortic and branch vessel atherosclerosis noted. There are possible small nonobstructing bilateral renal calculi. Disc levels: No large disc herniation or high-grade spinal stenosis identified. There is chronic degenerative disc disease at L5-S1 with loss of disc height, annular disc bulging and endplate osteophytes. Mild facet degenerative changes are present in the lower lumbar spine. No significant foraminal narrowing. IMPRESSION: 1. No evidence of acute lumbar spine fracture. Chronic L1 compression deformity appears unchanged. 2. There are several acute lower rib fractures bilaterally which may be incompletely visualized. Small right pleural effusion. Recommend correlation with bilateral rib radiographs. 3. Mild chronic lumbar spondylosis without evidence of high-grade spinal stenosis. 4.  Aortic Atherosclerosis (ICD10-I70.0). Electronically Signed   By: Carey Bullocks M.D.   On: 04/02/2021 14:44   No images are attached to the encounter.  Labs: Lab Results  Component Value Date   HGBA1C 9.4 (A) 01/02/2021   HGBA1C 10.2 09/12/2020   HGBA1C 10.2 06/14/2020   REPTSTATUS 02/14/2020 FINAL 02/13/2020   CULT (A) 02/13/2020    <10,000 COLONIES/mL INSIGNIFICANT GROWTH Performed at [facility], [address] [place], [place] [address]      Lab Results  Component Value Date   ALBUMIN 3.3 (L) 08/31/2020   ALBUMIN 3.9 02/12/2020   ALBUMIN 2.9 (L) 02/04/2020    Lab Results  Component Value Date   MG 1.7 02/04/2020   MG 1.2 (L) 02/03/2020   MG 1.6 (L) 09/06/2016   Lab Results  Component Value Date   VD25OH 41.9 06/16/2020    No results found for: PREALBUMIN CBC EXTENDED Latest Ref Rng & Units 08/31/2020 02/12/2020 02/04/2020  WBC 4.0 - 10.5 K/uL 6.8  5.7 4.2  RBC 4.22 - 5.81 MIL/uL 4.13(L) 3.98(L) 3.69(L)  HGB 13.0 - 17.0 g/dL 12.4(L) 11.4(L) 10.9(L)  HCT 39.0 - 52.0 % 38.7(L) 36.5(L) 34.3(L)  PLT 150 - 400 K/uL 181 208 170  NEUTROABS 1.7 - 7.7 K/uL 3.9 2.9 2.5  LYMPHSABS 0.7 - 4.0 K/uL 1.7 1.8 1.1     There is no height or weight on file to calculate BMI.  Orders:  No orders of the defined types were placed in this encounter.  No orders of the defined types were placed in this encounter.    Procedures: No procedures performed  Clinical Data: No additional findings.  ROS:  All other systems negative, except as noted in the HPI. Review of Systems  Constitutional:  Negative for chills and fever.  Respiratory: Negative.    Musculoskeletal:  Positive for myalgias.   Objective: Vital Signs: There were no vitals taken for this visit.  Specialty Comments:  No specialty comments available.  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) 07/13/2020   Exocrine pancreatic insufficiency 07/13/2020   Gallstones    Abnormal computed tomography angiography (CTA)    Hypomagnesemia 02/03/2020   Hypothermia 02/03/2020   AKI (acute kidney injury) (HCC) 02/02/2020   UTI (urinary tract infection) 02/02/2020   Lactic  acidosis 02/02/2020   Severe dehydration 02/02/2020   Agitation due to dementia (HCC) 02/02/2020   History of alcoholism (HCC) 02/09/2017   Post traumatic stress disorder 02/09/2017   Hydrocele 02/09/2017   Impacted cerumen of right ear 02/09/2017   Contusion of right upper back excluding scapular region 02/08/2017   Anemia 01/23/2017   Difficulty walking 01/23/2017   COPD (chronic obstructive pulmonary disease) (HCC) 01/18/2017   BPH (benign prostatic hyperplasia) 01/18/2017   Chronic pancreatitis (HCC) 01/18/2017   Essential hypertension 01/18/2017   Glaucoma 01/18/2017   Hypothyroidism  01/18/2017   Solitary pulmonary nodule 01/18/2017   Vitamin B12 deficiency 01/18/2017   Dementia with behavioral disturbance (HCC) 01/17/2017   Slow transit constipation 09/04/2016   Left cervical radiculopathy 07/26/2016   Closed head injury 10/20/2015   Fall 10/20/2015   Heme + stool 07/27/2015   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   Memory loss 10/31/2014   Dizziness 07/04/2014   Syncope 07/04/2014   Type 2 diabetes mellitus with hyperglycemia, with long-term current use of insulin (HCC) 07/04/2014   Fracture, pelvis closed (HCC) 12/01/2013   Proximal phalanx fracture of finger 12/01/2013   Fx metacarpal neck-closed 12/01/2013   Pelvis fracture (HCC) 11/10/2012   Pelvic fracture (HCC) 09/23/2012   Dislocation of PIP joint of finger 09/23/2012   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   Essential hypertension    Euthyroid sick syndrome    Glaucoma    H/O ETOH abuse    History of falling    Hydrocele    Hypoglycemia    Hypothyroidism    Insomnia    Kidney stone    Legally blind    Orthostatic hypotension    Pain in left shoulder    Personal history of alcoholism (HCC)    Post traumatic stress disorder    Psychosis (HCC)    Radiculopathy, cervical region    Retinopathy    Sleep terrors (night terrors)    Syncope and collapse    Type 2 diabetes mellitus (HCC)    Urinary incontinence    Vitamin B deficiency    Vitamin D deficiency     Family History  Problem Relation  Age of Onset   Heart disease Other    Diabetes Other     Past Surgical History:  Procedure Laterality Date   BACK SURGERY     removal of melanoma of back.   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 Darrick Penna: left colon redundant, pediatric scope used, sigmoid colon woult not adequately insufflate, moderate diverticulosis throughout entire colon, moderate sized IH    EXTRACORPOREAL SHOCK WAVE LITHOTRIPSY Right 11/18/2016   Procedure: RIGHT EXTRACORPOREAL SHOCK WAVE LITHOTRIPSY (ESWL);  Surgeon: Barron Alvine, MD;  Location: [facility];  Service: Urology;  Laterality: Right;   HAND SURGERY Right    middle finger; fractue  HEMORRHOID SURGERY     HERNIA REPAIR Left    inguinal   HIP SURGERY Bilateral    bilateral hip fractures   MOHS SURGERY     Melanoma   Social History   Occupational History   Occupation: retired  Tobacco Use   Smoking status: Former    Packs/day: 1.50    Years: 48.00    Pack years: 72.00    Types: Cigarettes    Start date: 09/17/1961    Quit date: 09/17/2009    Years since quitting: 11.5   Smokeless tobacco: Never  Vaping Use   Vaping Use: Never used  Substance and Sexual Activity   Alcohol use: No    Alcohol/week: 0.0 standard drinks    Comment: hx of alcoholism-last used 2011   Drug use: No   Sexual activity: Never    Birth control/protection: None

## 2021-04-03 NOTE — Addendum Note (Signed)
Addended by: Barnie Del R on: 04/03/2021 10:25 AM   Modules accepted: Orders

## 2021-04-04 ENCOUNTER — Encounter: Payer: Self-pay | Admitting: "Endocrinology

## 2021-04-04 ENCOUNTER — Ambulatory Visit (INDEPENDENT_AMBULATORY_CARE_PROVIDER_SITE_OTHER): Payer: Medicare Other | Admitting: "Endocrinology

## 2021-04-04 VITALS — BP 120/65 | HR 84 | Ht 68.0 in

## 2021-04-04 DIAGNOSIS — E089 Diabetes mellitus due to underlying condition without complications: Secondary | ICD-10-CM

## 2021-04-04 DIAGNOSIS — E039 Hypothyroidism, unspecified: Secondary | ICD-10-CM

## 2021-04-04 DIAGNOSIS — K8681 Exocrine pancreatic insufficiency: Secondary | ICD-10-CM

## 2021-04-04 DIAGNOSIS — E782 Mixed hyperlipidemia: Secondary | ICD-10-CM

## 2021-04-04 DIAGNOSIS — K8689 Other specified diseases of pancreas: Secondary | ICD-10-CM

## 2021-04-04 MED ORDER — INSULIN GLARGINE 100 UNIT/ML ~~LOC~~ SOLN: 40.0000 [IU] | Freq: Every day | SUBCUTANEOUS | 2 refills | Status: DC

## 2021-04-04 NOTE — Progress Notes (Signed)
04/04/2021, 12:55 PM  Endocrinology follow-up note   Subjective:    Patient ID: Joseph Bowen, male    DOB: 06/26/1946.  Joseph Bowen is being seen in follow-up after he was seen in consultation for management of currently uncontrolled symptomatic diabetes requested by  Colon Branch, MD. Patient was accompanied by his nursing home aide Arlys John.    Past Medical History:  Diagnosis Date   Alcohol-induced persisting amnestic disorder (HCC)    Anemia    Anxiety    Atherosclerosis of arteries    Benign prostatic hypertrophy    Chronic pancreatitis (HCC)    Constipation    COPD (chronic obstructive pulmonary disease) (HCC)    Dementia (HCC)    Dementia (HCC)    due to encephalopathy from diabetic coma 2011.   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   Essential hypertension    Euthyroid sick syndrome    Glaucoma    H/O ETOH abuse    History of falling    Hydrocele    Hypoglycemia    Hypothyroidism    Insomnia    Kidney stone    Legally blind    Orthostatic hypotension    Pain in left shoulder    Personal history of alcoholism (HCC)    Post traumatic stress disorder    Psychosis (HCC)    Radiculopathy, cervical region    Retinopathy    Sleep terrors (night terrors)    Syncope and collapse    Type 2 diabetes mellitus (HCC)    Urinary incontinence    Vitamin B deficiency    Vitamin D deficiency     Past Surgical History:  Procedure Laterality Date   BACK SURGERY     removal of melanoma of back.   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 Darrick Penna: left colon redundant, pediatric scope used, sigmoid colon woult not adequately insufflate, moderate diverticulosis throughout entire colon, moderate sized IH    EXTRACORPOREAL SHOCK WAVE LITHOTRIPSY Right 11/18/2016   Procedure: RIGHT  EXTRACORPOREAL SHOCK WAVE LITHOTRIPSY (ESWL);  Surgeon: Barron Alvine, MD;  Location: [facility];  Service: Urology;  Laterality: Right;   HAND SURGERY Right    middle finger; fractue   HEMORRHOID SURGERY     HERNIA REPAIR Left    inguinal   HIP SURGERY Bilateral    bilateral hip fractures   MOHS SURGERY     Melanoma    Social History   Socioeconomic History   Marital status: Divorced    Spouse name: Not on file   Number of children: Not on file   Years of education: Not on file   Highest education level: Not on file  Occupational History   Occupation: retired  Tobacco Use   Smoking status: Former    Packs/day: 1.50    Years: 48.00    Pack years: 72.00    Types: Cigarettes    Start date: 09/17/1961    Quit date: 09/17/2009    Years since quitting: 11.5   Smokeless tobacco: Never  Vaping Use   Vaping Use: Never used  Substance and Sexual Activity   Alcohol use: No    Alcohol/week: 0.0 standard drinks    Comment: hx of alcoholism-last used 2011   Drug use: No   Sexual  activity: Never    Birth control/protection: None  Other Topics Concern   Not on file  Social History Narrative   Not on file   Social Determinants of Health   Financial Resource Strain: Not on file  Food Insecurity: Not on file  Transportation Needs: Not on file  Physical Activity: Not on file  Stress: Not on file  Social Connections: Not on file    Family History  Problem Relation Age of Onset   Heart disease Other    Diabetes Other     Outpatient Encounter Medications as of 04/04/2021  Medication Sig   acetaminophen-codeine (TYLENOL #3) 300-30 MG tablet Take 1 tablet by mouth 2 times daily at 12 noon and 4 pm.   atorvastatin (LIPITOR) 20 MG tablet Take 20 mg by mouth daily.   benztropine (COGENTIN) 1 MG tablet Take 1 mg by mouth daily.   Cholecalciferol (VITAMIN D3) 1.25 MG (50000 UT) TABS Take 1 tablet by mouth once a week.   Cyanocobalamin (VITAMIN B 12 PO) Take 1,000 mcg by mouth daily.    divalproex (DEPAKOTE SPRINKLE) 125 MG capsule Take 250 mg by mouth 2 (two) times daily as needed.   docusate sodium (COLACE) 100 MG capsule Take 100 mg by mouth daily as needed for mild constipation.   ferrous sulfate 325 (65 FE) MG tablet Take 325 mg by mouth daily with breakfast. (Patient not taking: Reported on 10/24/2020)   gabapentin (NEURONTIN) 400 MG capsule Take 400 mg by mouth 3 (three) times daily.   haloperidol (HALDOL) 5 MG tablet Take 5 mg by mouth at bedtime.   HUMALOG 100 UNIT/ML injection Inject 10-13 Units into the skin 3 (three) times daily before meals.   insulin glargine (LANTUS) 100 UNIT/ML injection Inject 0.4 mLs (40 Units total) into the skin daily.   levothyroxine (SYNTHROID) 125 MCG tablet Take 125 mcg by mouth daily.   Lidocaine (HM LIDOCAINE PATCH) 4 % PTCH Apply 1 patch topically 1 day or 1 dose for 1 dose.   lipase/protease/amylase (CREON) 36000 UNITS CPEP capsule Take 2 capsule with meals and 1 capsule with snacks total of 8 daily.   LORazepam (ATIVAN) 0.5 MG tablet Take 1 tablet (0.5 mg total) by mouth 2 (two) times daily. (Patient taking differently: Take 0.5 mg by mouth every 6 (six) hours as needed.)   medroxyPROGESTERone (DEPO-PROVERA) 150 MG/ML injection Inject 150 mg into the muscle every 3 (three) months.   melatonin 3 MG TABS tablet Take 3 mg by mouth at bedtime.   memantine (NAMENDA) 5 MG tablet Take 5 mg by mouth daily.   midodrine (PROAMATINE) 5 MG tablet Take 5 mg by mouth daily as needed. (Patient not taking: Reported on 09/26/2020)   mirtazapine (REMERON) 7.5 MG tablet Take 7.5 mg by mouth at bedtime.   omeprazole (PRILOSEC) 20 MG capsule Take 20 mg by mouth daily. (Patient not taking: Reported on 01/02/2021)   Polyethyl Glycol-Propyl Glycol (SYSTANE) 0.4-0.3 % SOLN Apply 1 drop to eye 2 (two) times daily.   prazosin (MINIPRESS) 1 MG capsule Take 1 mg by mouth at bedtime.   sennosides-docusate sodium (SENOKOT-S) 8.6-50 MG tablet Take 1 tablet by mouth 2  (two) times daily.   sertraline (ZOLOFT) 50 MG tablet Take 75 mg by mouth daily.   tamsulosin (FLOMAX) 0.4 MG CAPS capsule Take 0.4 mg by mouth.   thiamine 100 MG tablet Take 100 mg by mouth daily.   traZODone (DESYREL) 50 MG tablet Take 50 mg by mouth at bedtime.   [  DISCONTINUED] insulin glargine (LANTUS) 100 UNIT/ML injection Inject 0.34 mLs (34 Units total) into the skin daily. (Patient taking differently: Inject 39 Units into the skin daily.)   [DISCONTINUED] lipase/protease/amylase (CREON) 36000 UNITS CPEP capsule Take by mouth.   No facility-administered encounter medications on file as of 04/04/2021.    ALLERGIES: No Known Allergies  VACCINATION STATUS:  There is no immunization history on file for this patient.  Diabetes He presents for his follow-up diabetic visit. Diabetes type: His diabetes is likely induced by pancreatic insufficiency related to alcohol history. Onset time: He stayed in [facility] for the last 11 years, came with a diagnosis of diabetes. His disease course has been worsening. There are no hypoglycemic associated symptoms. Pertinent negatives for hypoglycemia include no confusion, headaches, pallor or seizures. There are no diabetic associated symptoms. Pertinent negatives for diabetes include no chest pain, no fatigue, no polydipsia, no polyphagia, no polyuria and no weakness. There are no hypoglycemic complications. Symptoms are worsening. Risk factors for coronary artery disease include diabetes mellitus, dyslipidemia, hypertension, tobacco exposure, male sex and sedentary lifestyle (His diabetes is complicated by pancreatic insufficiency, history of encephalopathy, retinopathy, autonomic dysfunction with disequilibrium.). Current diabetic treatments: Is currently on Metformin 1000 mg p.o. twice daily, Januvia 100 mg p.o. daily, Lantus 28 units nightly, Humalog 2-12 units 4 times a day.  Compliance with diabetes treatment: He is 100% dependent on others  for care. His weight is fluctuating minimally (Patient does not remember how much his routine weight has been.  However, he was found to be under 100 pounds during admission to nursing home.  He is currently at 152 pounds.). He is following a generally unhealthy diet. He has not had a previous visit with a dietitian. He never participates in exercise. Blood glucose monitoring compliance is adequate. His home blood glucose trend is increasing steadily. His breakfast blood glucose range is generally >200 mg/dl. His lunch blood glucose range is generally >200 mg/dl. His dinner blood glucose range is generally >200 mg/dl. His bedtime blood glucose range is generally >200 mg/dl. His overall blood glucose range is >200 mg/dl. (He is accompanied by his aide from nursing home, Arlys John.  He is recent glycemic profiles are significant above target.  He recently fell and fractured 2 ribs currently on wheelchair.  His previsit A1c was 10%, increasing from 9.4%.  No significant hypoglycemia was documented or reported.    ) An ACE inhibitor/angiotensin II receptor blocker is not being taken. Eye exam is current.  Thyroid Problem Presents for initial visit. Onset time: Unknown duration and circumstance of diagnosis with hypothyroidism. Patient reports no constipation, diarrhea, fatigue or palpitations. Past treatments include levothyroxine. His past medical history is significant for diabetes.    Review of Systems  Constitutional:  Negative for chills, fatigue, fever and unexpected weight change.  HENT:  Negative for dental problem, mouth sores and trouble swallowing.   Eyes:  Negative for visual disturbance.  Respiratory:  Negative for cough, choking, chest tightness, shortness of breath and wheezing.   Cardiovascular:  Negative for chest pain, palpitations and leg swelling.  Gastrointestinal:  Negative for abdominal distention, abdominal pain, constipation, diarrhea, nausea and vomiting.  Endocrine: Negative for  polydipsia, polyphagia and polyuria.  Genitourinary:  Negative for dysuria, flank pain, hematuria and urgency.  Musculoskeletal:  Positive for gait problem. Negative for back pain, myalgias and neck pain.       Patient is known to have disequilibrium, syncope/presyncope currently on fludrocortisone, midodrine.  Skin:  Negative  for pallor, rash and wound.  Neurological:  Negative for seizures, syncope, weakness, numbness and headaches.  Psychiatric/Behavioral:  Negative for confusion and dysphoric mood.    Objective:    Vitals with BMI 04/04/2021 04/02/2021 04/02/2021  Height  - -  Weight - - -  BMI - - -  Systolic 120 146 604  Diastolic 65 82 83  Pulse 84 72 77    BP 120/65   Pulse 84   Ht  (1.727 m)   BMI 23.10 kg/m   Wt Readings from Last 3 Encounters:  04/02/21 151 lb 14.4 oz (68.9 kg)  01/09/21 152 lb (68.9 kg)  01/02/21 152 lb 1.6 oz (69 kg)     Physical Exam Constitutional:      General: He is not in acute distress.    Appearance: He is well-developed.  HENT:     Head: Normocephalic and atraumatic.  Neck:     Thyroid: No thyromegaly.     Trachea: No tracheal deviation.  Cardiovascular:     Rate and Rhythm: Normal rate.     Pulses:          Dorsalis pedis pulses are 1+ on the right side and 1+ on the left side.       Posterior tibial pulses are 1+ on the right side and 1+ on the left side.     Heart sounds: Normal heart sounds, S1 normal and S2 normal. No murmur heard.   No gallop.  Pulmonary:     Effort: Pulmonary effort is normal. No respiratory distress.     Breath sounds: No wheezing.  Abdominal:     General: There is no distension.     Tenderness: There is no abdominal tenderness. There is no guarding.  Musculoskeletal:     Right shoulder: No swelling or deformity.     Cervical back: Normal range of motion and neck supple.     Comments: Patient is on a wheelchair as a result of recent fall which caused bilateral rib fractures  Skin:     General: Skin is warm and dry.     Findings: No rash.     Nails: There is no clubbing.  Neurological:     Mental Status: He is alert and oriented to person, place, and time.     Cranial Nerves: No cranial nerve deficit.     Sensory: No sensory deficit.     Gait: Gait normal.     Deep Tendon Reflexes: Reflexes are normal and symmetric.  Psychiatric:        Speech: Speech normal.        Behavior: Behavior normal. Behavior is cooperative.        Thought Content: Thought content normal.        Judgment: Judgment normal.     Comments: Patient has significant cognitive deficit.   He answers simple questions.    CMP ( most recent) CMP     Component Value Date/Time   NA 135 08/31/2020 2336   K 3.7 08/31/2020 2336   CL 104 08/31/2020 2336   CO2 25 08/31/2020 2336   GLUCOSE 116 (H) 08/31/2020 2336   BUN 25 (A) 01/03/2021 0000   CREATININE 1.1 01/03/2021 0000   CREATININE 0.99 08/31/2020 2336   CALCIUM 8.8 (L) 08/31/2020 2336   PROT 5.9 (L) 08/31/2020 2336   ALBUMIN 3.3 (L) 08/31/2020 2336   AST 22 08/31/2020 2336   ALT 22 08/31/2020 2336   ALKPHOS 47 08/31/2020 2336  BILITOT 0.4 08/31/2020 2336   GFRNONAA >60 08/31/2020 2336   GFRAA 100 08/24/2020 0000     Diabetic Labs (most recent): Lab Results  Component Value Date   HGBA1C 10 01/03/2021   HGBA1C 9.4 (A) 01/02/2021   HGBA1C 10.2 09/12/2020    Assessment & Plan:   1. 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  Patient resides in the [facility] nursing home for the last 11 years.  He has a past history of heavy alcohol use which likely triggered the diabetes and exocrine pancreatic insufficiency.   He is accompanied by his aide from nursing home, Arlys John.  He is recent glycemic profiles are significant above target.  He recently fell and fractured 2 ribs currently on wheelchair.  His previsit A1c was  10%, increasing from 9.4%.  No significant hypoglycemia was documented or reported.       He is accompanied by his power of attorney and nursing home aide.  Recent labs reviewed. - I had a long discussion with him and his company about the  nature of his diabetes and the pathology behind its complications.  -He is at risk of hypoglycemia due to lack of endogenous glucagon response from destruction of the alpha cells in addition to the beta cells. 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 These are all discussed in detail with him.  -He would benefit from more introduction of unprocessed or complex starch in lieu of   processed carbohydrates such as soda, sweet tea, and ice cream.   - I have approached him with the following individualized plan to manage  his diabetes and patient agrees:   -He has limited options to treat his diabetes.  He is not a candidate for metformin, nor incretin therapy.     -He will continue to need intensive treatment with basal/bolus insulin in order for him to achieve and maintain control of diabetes to target.     -Accordingly, he is advised to increase Lantus to 40 units nightly, advised to increase Humalog to 10  units 3 times a day with meals  for pre-meal BG readings of 90-150mg /dl, plus patient specific correction dose for unexpected hyperglycemia above 150mg /dl, associated with strict monitoring of glucose 4 times a day-before meals and at bedtime.  Maximum Humalog will be 13 units 3 times daily AC.  - he is warned not to take insulin without proper monitoring per orders. - Adjustment parameters are given to him for hypo and hyperglycemia in writing. - he is encouraged to call clinic for blood glucose levels less than 70 or above 300 mg /dl. -He was  taken off of Metformin and Januvia during his prior visits.   - Specific targets for  A1c;  LDL, HDL,  and Triglycerides were discussed with the patient.  2) Blood Pressure /Hypertension:  -His blood pressure is controlled to target at 120/65.  Evidently, he did not tolerate blood pressure medications.  He has a tendency for presyncope/syncope, currently on midodrine and fludrocortisone.  He will be evaluated for adrenal insufficiency on subsequent visits.   3) Lipids/Hyperlipidemia: He does not have recent lipid panel to review.  He is currently on Lipitor 40 mg p.o. nightly, advised to continue.     4)  Weight/Diet:  Body mass index is 23.1 kg/m.  -He has gained 10+  pounds overall.  He will benefit from some more weight gain.  he is not a candidate for weight loss.  He walks with a cane, fall risk is high.  Precautions discussed with him.   5) hypothyroidism: Duration and circumstance of diagnosis are not available to review.  His recent thyroid function tests are consistent with appropriate replacement.  He is advised to continue levothyroxine 125 mcg p.o. daily before breakfast.    - We discussed about the correct intake of his thyroid hormone, on empty stomach at fasting, with water, separated by at least 30 minutes from breakfast and other medications,  and separated by more than 4 hours from calcium, iron, multivitamins, acid reflux medications (PPIs). -Patient is made aware of the fact that thyroid hormone replacement is needed for life, dose to be adjusted by periodic monitoring of thyroid function tests.  6) exocrine pancreatic insufficiency -This is likely related to his past history of heavy alcohol consumption/abuse.    He is currently on Creon 36,000 units- - 2 capsules with meals and 1 capsule with snacks.  He is advised to continue.  7) Chronic Care/Health Maintenance:  -he  Is on  Statin medications and  is encouraged to initiate and continue to follow up with  Ophthalmology, Dentist,  Podiatrist at least yearly or according to recommendations, and advised to   stay away from smoking. I have recommended yearly flu vaccine and pneumonia vaccine at least every 5 years; moderate intensity exercise for up to 150 minutes weekly; and  sleep for at least 7 hours a day. His screening ABI was normal on July 24, 2020. This study will be repeated in 5 years, or sooner if needed.   - he is  advised to maintain close follow up with Colon Branch, MD for primary care needs, as well as his other providers for optimal and coordinated care.   I spent 44 minutes in the care of the patient today including review of labs from CMP, Lipids, Thyroid Function, Hematology (current and previous including abstractions from other facilities); face-to-face time discussing  his blood glucose readings/logs, discussing hypoglycemia and hyperglycemia episodes and symptoms, medications doses, his options of short and long term treatment based on the latest standards of care / guidelines;  discussion about incorporating lifestyle medicine;  and documenting the encounter.    Please refer to Patient Instructions for Blood Glucose Monitoring and Insulin/Medications Dosing Guide"  in media tab for additional information. Please  also refer to " Patient Self Inventory" in the Media  tab for reviewed elements of pertinent patient history.  Joseph Bowen and his aide Arlys John participated in the discussions, expressed understanding, and voiced agreement with the above plans.  All questions were answered to his satisfaction. he is encouraged to contact clinic should he have any questions or concerns prior to his return visit.     Follow up plan: - Return in about 4 weeks (around 05/02/2021) for F/U with Meter and Logs Only - no Labs.  Marquis Lunch, MD [facility] [address] [place], [place] [address] Phone: [phone]  Fax:  [phone]    04/04/2021, 12:55 PM  This note was partially dictated with voice recognition software. Similar sounding words can be transcribed inadequately or may not  be corrected upon review.

## 2021-05-02 ENCOUNTER — Encounter: Payer: Self-pay | Admitting: "Endocrinology

## 2021-05-02 ENCOUNTER — Other Ambulatory Visit: Payer: Self-pay

## 2021-05-02 ENCOUNTER — Ambulatory Visit (INDEPENDENT_AMBULATORY_CARE_PROVIDER_SITE_OTHER): Payer: Medicare Other | Admitting: "Endocrinology

## 2021-05-02 VITALS — BP 122/50 | HR 76

## 2021-05-02 DIAGNOSIS — K8689 Other specified diseases of pancreas: Secondary | ICD-10-CM

## 2021-05-02 DIAGNOSIS — K8681 Exocrine pancreatic insufficiency: Secondary | ICD-10-CM

## 2021-05-02 DIAGNOSIS — E089 Diabetes mellitus due to underlying condition without complications: Secondary | ICD-10-CM | POA: Diagnosis not present

## 2021-05-02 DIAGNOSIS — E782 Mixed hyperlipidemia: Secondary | ICD-10-CM

## 2021-05-02 DIAGNOSIS — E039 Hypothyroidism, unspecified: Secondary | ICD-10-CM | POA: Diagnosis not present

## 2021-05-02 LAB — BASIC METABOLIC PANEL
BUN: 25 — AB (ref 4–21)
CO2: 20 (ref 13–22)
Chloride: 99 (ref 99–108)
Creatinine: 0.8 (ref 0.6–1.3)
Glucose: 256
Potassium: 4.1 (ref 3.4–5.3)
Sodium: 132 — AB (ref 137–147)

## 2021-05-02 LAB — POCT GLYCOSYLATED HEMOGLOBIN (HGB A1C): HbA1c, POC (controlled diabetic range): 9.1 % — AB (ref 0.0–7.0)

## 2021-05-02 LAB — COMPREHENSIVE METABOLIC PANEL
Albumin: 3.2 — AB (ref 3.5–5.0)
Calcium: 9 (ref 8.7–10.7)
Globulin: 2

## 2021-05-02 LAB — HEPATIC FUNCTION PANEL
ALT: 20 (ref 10–40)
AST: 21 (ref 14–40)
Alkaline Phosphatase: 103 (ref 25–125)
Bilirubin, Total: 0.2

## 2021-05-02 LAB — TSH: TSH: 1.82 (ref 0.41–5.90)

## 2021-05-02 MED ORDER — INSULIN GLARGINE 100 UNIT/ML ~~LOC~~ SOLN: 44.0000 [IU] | Freq: Every day | SUBCUTANEOUS | 2 refills | Status: AC

## 2021-05-02 NOTE — Progress Notes (Signed)
04/04/2021, 12:55 PM  Endocrinology follow-up note   Subjective:    Patient ID: Joseph Bowen, male    DOB: 06/26/1946.  Joseph Bowen is being seen in follow-up after he was seen in consultation for management of currently uncontrolled symptomatic diabetes requested by  Colon Branch, MD. Patient was accompanied by his nursing home aide Arlys John.    Past Medical History:  Diagnosis Date   Alcohol-induced persisting amnestic disorder (HCC)    Anemia    Anxiety    Atherosclerosis of arteries    Benign prostatic hypertrophy    Chronic pancreatitis (HCC)    Constipation    COPD (chronic obstructive pulmonary disease) (HCC)    Dementia (HCC)    Dementia (HCC)    due to encephalopathy from diabetic coma 2011.   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   Essential hypertension    Euthyroid sick syndrome    Glaucoma    H/O ETOH abuse    History of falling    Hydrocele    Hypoglycemia    Hypothyroidism    Insomnia    Kidney stone    Legally blind    Orthostatic hypotension    Pain in left shoulder    Personal history of alcoholism (HCC)    Post traumatic stress disorder    Psychosis (HCC)    Radiculopathy, cervical region    Retinopathy    Sleep terrors (night terrors)    Syncope and collapse    Type 2 diabetes mellitus (HCC)    Urinary incontinence    Vitamin B deficiency    Vitamin D deficiency     Past Surgical History:  Procedure Laterality Date   BACK SURGERY     removal of melanoma of back.   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 Darrick Penna: left colon redundant, pediatric scope used, sigmoid colon woult not adequately insufflate, moderate diverticulosis throughout entire colon, moderate sized IH    EXTRACORPOREAL SHOCK WAVE LITHOTRIPSY Right 11/18/2016   Procedure: RIGHT  EXTRACORPOREAL SHOCK WAVE LITHOTRIPSY (ESWL);  Surgeon: Barron Alvine, MD;  Location: [facility];  Service: Urology;  Laterality: Right;   HAND SURGERY Right    middle finger; fractue   HEMORRHOID SURGERY     HERNIA REPAIR Left    inguinal   HIP SURGERY Bilateral    bilateral hip fractures   MOHS SURGERY     Melanoma    Social History   Socioeconomic History   Marital status: Divorced    Spouse name: Not on file   Number of children: Not on file   Years of education: Not on file   Highest education level: Not on file  Occupational History   Occupation: retired  Tobacco Use   Smoking status: Former    Packs/day: 1.50    Years: 48.00    Pack years: 72.00    Types: Cigarettes    Start date: 09/17/1961    Quit date: 09/17/2009    Years since quitting: 11.5   Smokeless tobacco: Never  Vaping Use   Vaping Use: Never used  Substance and Sexual Activity   Alcohol use: No    Alcohol/week: 0.0 standard drinks    Comment: hx of alcoholism-last used 2011   Drug use: No   Sexual  activity: Never    Birth control/protection: None  Other Topics Concern   Not on file  Social History Narrative   Not on file   Social Determinants of Health   Financial Resource Strain: Not on file  Food Insecurity: Not on file  Transportation Needs: Not on file  Physical Activity: Not on file  Stress: Not on file  Social Connections: Not on file    Family History  Problem Relation Age of Onset   Heart disease Other    Diabetes Other     Outpatient Encounter Medications as of 04/04/2021  Medication Sig   acetaminophen-codeine (TYLENOL #3) 300-30 MG tablet Take 1 tablet by mouth 2 times daily at 12 noon and 4 pm.   atorvastatin (LIPITOR) 20 MG tablet Take 20 mg by mouth daily.   benztropine (COGENTIN) 1 MG tablet Take 1 mg by mouth daily.   Cholecalciferol (VITAMIN D3) 1.25 MG (50000 UT) TABS Take 1 tablet by mouth once a week.   Cyanocobalamin (VITAMIN B 12 PO) Take 1,000 mcg by mouth daily.    divalproex (DEPAKOTE SPRINKLE) 125 MG capsule Take 250 mg by mouth 2 (two) times daily as needed.   docusate sodium (COLACE) 100 MG capsule Take 100 mg by mouth daily as needed for mild constipation.   ferrous sulfate 325 (65 FE) MG tablet Take 325 mg by mouth daily with breakfast. (Patient not taking: Reported on 10/24/2020)   gabapentin (NEURONTIN) 400 MG capsule Take 400 mg by mouth 3 (three) times daily.   haloperidol (HALDOL) 5 MG tablet Take 5 mg by mouth at bedtime.   HUMALOG 100 UNIT/ML injection Inject 10-13 Units into the skin 3 (three) times daily before meals.   insulin glargine (LANTUS) 100 UNIT/ML injection Inject 0.4 mLs (40 Units total) into the skin daily.   levothyroxine (SYNTHROID) 125 MCG tablet Take 125 mcg by mouth daily.   Lidocaine (HM LIDOCAINE PATCH) 4 % PTCH Apply 1 patch topically 1 day or 1 dose for 1 dose.   lipase/protease/amylase (CREON) 36000 UNITS CPEP capsule Take 2 capsule with meals and 1 capsule with snacks total of 8 daily.   LORazepam (ATIVAN) 0.5 MG tablet Take 1 tablet (0.5 mg total) by mouth 2 (two) times daily. (Patient taking differently: Take 0.5 mg by mouth every 6 (six) hours as needed.)   medroxyPROGESTERone (DEPO-PROVERA) 150 MG/ML injection Inject 150 mg into the muscle every 3 (three) months.   melatonin 3 MG TABS tablet Take 3 mg by mouth at bedtime.   memantine (NAMENDA) 5 MG tablet Take 5 mg by mouth daily.   midodrine (PROAMATINE) 5 MG tablet Take 5 mg by mouth daily as needed. (Patient not taking: Reported on 09/26/2020)   mirtazapine (REMERON) 7.5 MG tablet Take 7.5 mg by mouth at bedtime.   omeprazole (PRILOSEC) 20 MG capsule Take 20 mg by mouth daily. (Patient not taking: Reported on 01/02/2021)   Polyethyl Glycol-Propyl Glycol (SYSTANE) 0.4-0.3 % SOLN Apply 1 drop to eye 2 (two) times daily.   prazosin (MINIPRESS) 1 MG capsule Take 1 mg by mouth at bedtime.   sennosides-docusate sodium (SENOKOT-S) 8.6-50 MG tablet Take 1 tablet by mouth 2  (two) times daily.   sertraline (ZOLOFT) 50 MG tablet Take 75 mg by mouth daily.   tamsulosin (FLOMAX) 0.4 MG CAPS capsule Take 0.4 mg by mouth.   thiamine 100 MG tablet Take 100 mg by mouth daily.   traZODone (DESYREL) 50 MG tablet Take 50 mg by mouth at bedtime.   [  DISCONTINUED] insulin glargine (LANTUS) 100 UNIT/ML injection Inject 0.34 mLs (34 Units total) into the skin daily. (Patient taking differently: Inject 39 Units into the skin daily.)   [DISCONTINUED] lipase/protease/amylase (CREON) 36000 UNITS CPEP capsule Take by mouth.   No facility-administered encounter medications on file as of 04/04/2021.    ALLERGIES: No Known Allergies  VACCINATION STATUS:  There is no immunization history on file for this patient.  Diabetes He presents for his follow-up diabetic visit. Diabetes type: His diabetes is likely induced by pancreatic insufficiency related to alcohol history. Onset time: He stayed in [facility] nursing home for the last 11 years, came with a diagnosis of diabetes. His disease course has been improving. There are no hypoglycemic associated symptoms. Pertinent negatives for hypoglycemia include no confusion, headaches, pallor or seizures. There are no diabetic associated symptoms. Pertinent negatives for diabetes include no chest pain, no fatigue, no polydipsia, no polyphagia, no polyuria and no weakness. There are no hypoglycemic complications. Symptoms are improving. Risk factors for coronary artery disease include diabetes mellitus, dyslipidemia, hypertension, tobacco exposure, male sex and sedentary lifestyle (His diabetes is complicated by pancreatic insufficiency, history of encephalopathy, retinopathy, autonomic dysfunction with disequilibrium.). Current diabetic treatments: Is currently on Metformin 1000 mg p.o. twice daily, Januvia 100 mg p.o. daily, Lantus 28 units nightly, Humalog 2-12 units 4 times a day.  Compliance with diabetes treatment: He is 100% dependent on others  for care. His weight is stable (Patient does not remember how much his routine weight has been.  However, he was found to be under 100 pounds during admission to nursing home.  He is currently at 151 pounds.). He is following a generally unhealthy diet. He has not had a previous visit with a dietitian. He never participates in exercise. Blood glucose monitoring compliance is adequate. His home blood glucose trend is fluctuating minimally. His breakfast blood glucose range is generally 180-200 mg/dl. His lunch blood glucose range is generally >200 mg/dl. His dinner blood glucose range is generally >200 mg/dl. His bedtime blood glucose range is generally >200 mg/dl. His overall blood glucose range is >200 mg/dl. (He is accompanied by his aide from nursing home.  He presents with slightly better glycemic profile, still above target at fasting.  Reportedly he consumes sweetened beverages overnight.  His point-of-care A1c is 9.1%, improving from 10%.  No significant hypoglycemia registered or reported.   ) An ACE inhibitor/angiotensin II receptor blocker is not being taken. Eye exam is current.  Thyroid Problem Presents for follow-up visit. Onset time: Unknown duration and circumstance of diagnosis with hypothyroidism. Patient reports no constipation, diarrhea, fatigue or palpitations. Past treatments include levothyroxine. His past medical history is significant for diabetes.    Review of Systems  Constitutional:  Negative for chills, fatigue, fever and unexpected weight change.  HENT:  Negative for dental problem, mouth sores and trouble swallowing.   Eyes:  Negative for visual disturbance.  Respiratory:  Negative for cough, choking, chest tightness, shortness of breath and wheezing.   Cardiovascular:  Negative for chest pain, palpitations and leg swelling.  Gastrointestinal:  Negative for abdominal distention, abdominal pain, constipation, diarrhea, nausea and vomiting.  Endocrine: Negative for  polydipsia, polyphagia and polyuria.  Genitourinary:  Negative for dysuria, flank pain, hematuria and urgency.  Musculoskeletal:  Positive for gait problem. Negative for back pain, myalgias and neck pain.       Patient is known to have disequilibrium, syncope/presyncope currently on fludrocortisone, midodrine.  Skin:  Negative for pallor, rash and wound.  Neurological:  Negative for seizures, syncope, weakness, numbness and headaches.  Psychiatric/Behavioral:  Negative for confusion and dysphoric mood.    Objective:    Vitals with BMI 04/04/2021 04/02/2021 04/02/2021  Height  - -  Weight - - -  BMI - - -  Systolic 120 146 454  Diastolic 65 82 83  Pulse 84 72 77    BP 120/65   Pulse 84   Ht  (1.727 m)   BMI 23.10 kg/m   Wt Readings from Last 3 Encounters:  04/02/21 151 lb 14.4 oz (68.9 kg)  01/09/21 152 lb (68.9 kg)  01/02/21 152 lb 1.6 oz (69 kg)     Physical Exam Constitutional:      General: He is not in acute distress.    Appearance: He is well-developed.  HENT:     Head: Normocephalic and atraumatic.  Neck:     Thyroid: No thyromegaly.     Trachea: No tracheal deviation.  Cardiovascular:     Rate and Rhythm: Normal rate.     Pulses:          Dorsalis pedis pulses are 1+ on the right side and 1+ on the left side.       Posterior tibial pulses are 1+ on the right side and 1+ on the left side.     Heart sounds: Normal heart sounds, S1 normal and S2 normal. No murmur heard.   No gallop.  Pulmonary:     Effort: Pulmonary effort is normal. No respiratory distress.     Breath sounds: No wheezing.  Abdominal:     General: There is no distension.     Tenderness: There is no abdominal tenderness. There is no guarding.  Musculoskeletal:     Right shoulder: No swelling or deformity.     Cervical back: Normal range of motion and neck supple.     Comments: Patient is on a wheelchair as a result of recent fall which caused bilateral rib fractures  Skin:     General: Skin is warm and dry.     Findings: No rash.     Nails: There is no clubbing.  Neurological:     Mental Status: He is alert and oriented to person, place, and time.     Cranial Nerves: No cranial nerve deficit.     Sensory: No sensory deficit.     Gait: Gait normal.     Deep Tendon Reflexes: Reflexes are normal and symmetric.  Psychiatric:        Speech: Speech normal.        Behavior: Behavior normal. Behavior is cooperative.        Thought Content: Thought content normal.        Judgment: Judgment normal.     Comments: Patient has significant cognitive deficit.   He answers simple questions.    CMP ( most recent) CMP     Component Value Date/Time   NA 135 08/31/2020 2336   K 3.7 08/31/2020 2336   CL 104 08/31/2020 2336   CO2 25 08/31/2020 2336   GLUCOSE 116 (H) 08/31/2020 2336   BUN 25 (A) 01/03/2021 0000   CREATININE 1.1 01/03/2021 0000   CREATININE 0.99 08/31/2020 2336   CALCIUM 8.8 (L) 08/31/2020 2336   PROT 5.9 (L) 08/31/2020 2336   ALBUMIN 3.3 (L) 08/31/2020 2336   AST 22 08/31/2020 2336   ALT 22 08/31/2020 2336   ALKPHOS 47 08/31/2020 2336   BILITOT 0.4 08/31/2020 2336  GFRNONAA >60 08/31/2020 2336   GFRAA 100 08/24/2020 0000     Diabetic Labs (most recent): Lab Results  Component Value Date   HGBA1C 10 01/03/2021   HGBA1C 9.4 (A) 01/02/2021   HGBA1C 10.2 09/12/2020    Assessment & Plan:   1. 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  Patient resides in the [facility] nursing home for the last 11 years.  He has a past history of heavy alcohol use which likely triggered the diabetes and exocrine pancreatic insufficiency.   He is accompanied by his aide from nursing home.  He presents with slightly better glycemic profile, still above target at fasting.  Reportedly he consumes sweetened beverages overnight.  His point-of-care  A1c is 9.1%, improving from 10%.  No significant hypoglycemia registered or reported.     He is accompanied by his power of attorney and nursing home aide.  Recent labs reviewed. - I had a long discussion with him and his company about the  nature of his diabetes and the pathology behind its complications.  -He is at risk of hypoglycemia due to lack of endogenous glucagon response from destruction of the alpha cells in addition to the beta cells.    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 These are all discussed in detail with him.  -He would benefit from more introduction of unprocessed or complex starch in lieu of   processed carbohydrates such as soda, sweet tea, and ice cream.   - I have approached him with the following individualized plan to manage  his diabetes and patient agrees:   -He options to treat diabetes is limited to basal/bolus insulin.    He is not a candidate for metformin, nor incretin therapy.     -He will continue to need intensive treatment with basal/bolus insulin in order for him to achieve and maintain control of diabetes to target.     -Accordingly, he is advised to increase Lantus to 44 units nightly, and continue Humalog  10  units 3 times a day with meals  for pre-meal BG readings of 90-150mg /dl, plus patient specific correction dose for unexpected hyperglycemia above 150mg /dl, associated with strict monitoring of glucose 4 times a day-before meals and at bedtime.  Maximum Humalog will be 16 units 3 times daily AC.  - he is warned not to take insulin without proper monitoring per orders. - Adjustment parameters are given to him for hypo and hyperglycemia in writing. - he is encouraged to call clinic for blood glucose levels less than 70 or above 300 mg  /dl. -He was taken off of Metformin and Januvia during his prior visits.   - Specific targets for  A1c;  LDL, HDL,  and Triglycerides were discussed with the patient.  2) Blood Pressure /Hypertension:  -His blood pressure is controlled to target at 122/50.  evidently, he did not tolerate blood pressure medications.  He has a tendency for presyncope/syncope, currently on midodrine and fludrocortisone.  He will be evaluated for adrenal insufficiency on subsequent visits.   3) Lipids/Hyperlipidemia: He does not have recent lipid panel to review.  He is currently on Lipitor 40 mg p.o. nightly, advised to continue.     4)  Weight/Diet:  Body mass index  is 23.1 kg/m.  -He has gained 10+  pounds overall.  He will benefit from some more weight gain.  he is not a candidate for weight loss.  He walks with a cane, fall risk is high.  Precautions discussed with him.   5) hypothyroidism: Duration and circumstance of diagnosis are not available to review.  His recent thyroid function tests are consistent with appropriate replacement.  He is advised to continue levothyroxine 125 mcg p.o. daily before breakfast.  Will be considered for thyroid function test before his next visit.   -I discussed the correct intake of his thyroid hormone, on empty stomach at fasting, with water, separated by at least 30 minutes from breakfast and other medications,  and separated by more than 4 hours from calcium, iron, multivitamins, acid reflux medications (PPIs). -Patient is made aware of the fact that thyroid hormone replacement is needed for life, dose to be adjusted by periodic monitoring of thyroid function tests.   6) exocrine pancreatic insufficiency -This is likely related to his past history of heavy alcohol consumption/abuse.    He is currently on Creon 36,000 units- - 2 capsules with meals and 1 capsule with snacks.  He is advised to continue.  7) Chronic Care/Health Maintenance:  -he  Is on  Statin  medications and  is encouraged to initiate and continue to follow up with Ophthalmology, Dentist,  Podiatrist at least yearly or according to recommendations, and advised to   stay away from smoking. I have recommended yearly flu vaccine and pneumonia vaccine at least every 5 years; moderate intensity exercise for up to 150 minutes weekly; and  sleep for at least 7 hours a day. His screening ABI was normal on July 24, 2020. This study will be repeated in 5 years, or sooner if needed.   - he is  advised to maintain close follow up with Colon Branch, MD for primary care needs, as well as his other providers for optimal and coordinated care.     I spent 40 minutes in the care of the patient today including review of labs from CMP, Lipids, Thyroid Function, Hematology (current and previous including abstractions from other facilities); face-to-face time discussing  his blood glucose readings/logs, discussing hypoglycemia and hyperglycemia episodes and symptoms, medications doses, his options of short and long term treatment based on the latest standards of care / guidelines;  discussion about incorporating lifestyle medicine;  and documenting the encounter.    Please refer to Patient Instructions for Blood Glucose Monitoring and Insulin/Medications Dosing Guide"  in media tab for additional information. Please  also refer to " Patient Self Inventory" in the Media  tab for reviewed elements of pertinent patient history.  Joseph Bowen participated in the discussions, expressed understanding, and voiced agreement with the above plans.  All questions were answered to his satisfaction. he is encouraged to contact clinic should he have any questions or concerns prior to his return visit.      Follow up plan: - Return in about 4 weeks (around 05/02/2021) for F/U with Meter and Logs Only - no Labs.  Marquis Lunch, MD [facility] [address] [place], [place] [address] Phone: [phone]  Fax: [phone]    04/04/2021, 12:55 PM  This note was partially dictated with voice recognition software. Similar sounding words can be transcribed inadequately or may not  be corrected upon review.

## 2021-06-14 DEATH — deceased

## 2021-07-03 ENCOUNTER — Ambulatory Visit: Payer: Medicare Other | Admitting: Neurology

## 2021-09-04 ENCOUNTER — Ambulatory Visit: Payer: Medicare Other | Admitting: "Endocrinology

## 2022-01-01 ENCOUNTER — Ambulatory Visit (HOSPITAL_COMMUNITY): Payer: Self-pay

## 2022-01-08 ENCOUNTER — Ambulatory Visit: Payer: Medicare Other | Admitting: Urology

## 2022-01-09 ENCOUNTER — Ambulatory Visit: Payer: Medicare Other | Admitting: Urology

## 2022-10-08 IMAGING — DX DG CHEST 1V PORT
1 series · 1 of 1 positions shown · non-contrast
Comparison: 02/12/2020

CLINICAL DATA: Altered mental status.

EXAM:
PORTABLE CHEST 1 VIEW

[chest ap]
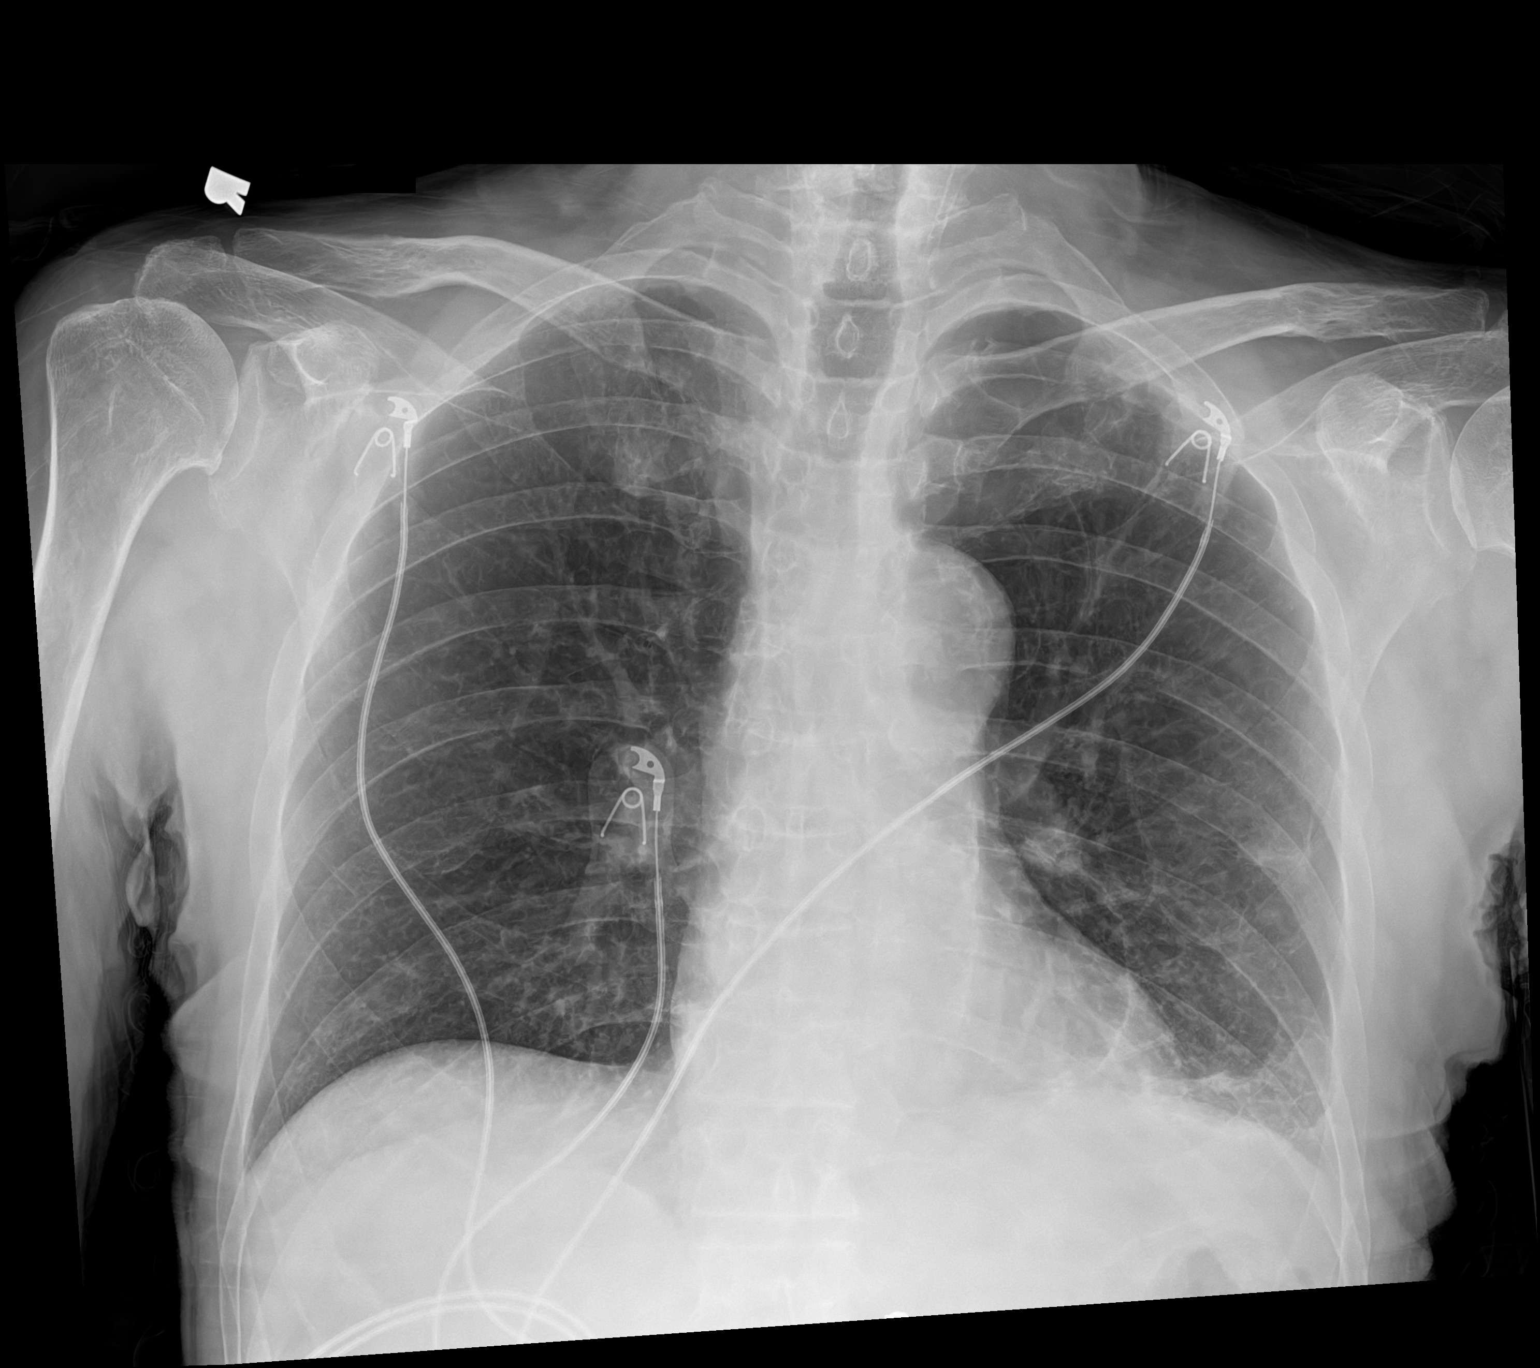

[1 of 1 positions shown; findings below may reference images not displayed]

FINDINGS: Stable scarring at the right lung base. Apical emphysema with blebs.
No acute airspace disease. Normal heart size and mediastinal
contours. Aortic atherosclerosis. No pulmonary edema. No pleural
fluid. No pneumothorax. No acute osseous abnormalities are seen.
IMPRESSION: 1. No acute chest finding.
2. Stable scarring at the right lung base. Apical emphysema with
blebs.
